# Patient Record
Sex: Male | Born: 1935
Health system: Southern US, Community
[De-identification: ages and names within clinical notes are randomized; demographics above are authoritative.]

## PROBLEM LIST (undated history)

## (undated) DIAGNOSIS — E1140 Type 2 diabetes mellitus with diabetic neuropathy, unspecified: Secondary | ICD-10-CM

## (undated) DIAGNOSIS — K8689 Other specified diseases of pancreas: Secondary | ICD-10-CM

## (undated) DIAGNOSIS — I10 Essential (primary) hypertension: Secondary | ICD-10-CM

## (undated) DIAGNOSIS — M810 Age-related osteoporosis without current pathological fracture: Secondary | ICD-10-CM

## (undated) DIAGNOSIS — J309 Allergic rhinitis, unspecified: Secondary | ICD-10-CM

## (undated) DIAGNOSIS — G5600 Carpal tunnel syndrome, unspecified upper limb: Secondary | ICD-10-CM

## (undated) DIAGNOSIS — E669 Obesity, unspecified: Secondary | ICD-10-CM

## (undated) DIAGNOSIS — N451 Epididymitis: Secondary | ICD-10-CM

## (undated) DIAGNOSIS — N529 Male erectile dysfunction, unspecified: Secondary | ICD-10-CM

## (undated) DIAGNOSIS — F4310 Post-traumatic stress disorder, unspecified: Secondary | ICD-10-CM

## (undated) DIAGNOSIS — C61 Malignant neoplasm of prostate: Secondary | ICD-10-CM

## (undated) DIAGNOSIS — R351 Nocturia: Secondary | ICD-10-CM

## (undated) DIAGNOSIS — M1990 Unspecified osteoarthritis, unspecified site: Secondary | ICD-10-CM

## (undated) DIAGNOSIS — E7800 Pure hypercholesterolemia, unspecified: Secondary | ICD-10-CM

## (undated) DIAGNOSIS — E119 Type 2 diabetes mellitus without complications: Secondary | ICD-10-CM

## (undated) HISTORY — DX: Male erectile dysfunction, unspecified: N52.9

## (undated) HISTORY — PX: KNEE ARTHROSCOPY: SUR90

## (undated) HISTORY — PX: CATARACT EXTRACTION: SUR2

## (undated) HISTORY — DX: Unspecified osteoarthritis, unspecified site: M19.90

## (undated) HISTORY — DX: Obesity, unspecified: E66.9

## (undated) HISTORY — DX: Type 2 diabetes mellitus with diabetic neuropathy, unspecified: E11.40

## (undated) HISTORY — DX: Nocturia: R35.1

## (undated) HISTORY — DX: Post-traumatic stress disorder, unspecified: F43.10

## (undated) HISTORY — DX: Essential (primary) hypertension: I10

## (undated) HISTORY — DX: Pure hypercholesterolemia, unspecified: E78.00

## (undated) HISTORY — DX: Carpal tunnel syndrome, unspecified upper limb: G56.00

## (undated) HISTORY — PX: MULTIPLE TOOTH EXTRACTIONS: SHX2053

## (undated) HISTORY — PX: PROSTATECTOMY: SHX69

## (undated) HISTORY — DX: Age-related osteoporosis without current pathological fracture: M81.0

## (undated) HISTORY — PX: ROTATOR CUFF REPAIR: SHX139

## (undated) HISTORY — DX: Epididymitis: N45.1

## (undated) HISTORY — DX: Malignant neoplasm of prostate: C61

## (undated) HISTORY — DX: Allergic rhinitis, unspecified: J30.9

---

## 2002-07-20 ENCOUNTER — Encounter: Admission: RE | Admit: 2002-07-20 | Discharge: 2002-07-20 | Payer: Self-pay | Admitting: Orthopedic Surgery

## 2002-07-20 ENCOUNTER — Ambulatory Visit (HOSPITAL_BASED_OUTPATIENT_CLINIC_OR_DEPARTMENT_OTHER): Admission: RE | Admit: 2002-07-20 | Discharge: 2002-07-21 | Payer: Self-pay | Admitting: Orthopedic Surgery

## 2002-07-20 ENCOUNTER — Encounter: Payer: Self-pay | Admitting: Orthopedic Surgery

## 2005-03-30 ENCOUNTER — Emergency Department: Payer: Self-pay | Admitting: Emergency Medicine

## 2006-02-16 ENCOUNTER — Ambulatory Visit: Payer: Self-pay | Admitting: Gastroenterology

## 2007-02-11 DIAGNOSIS — J309 Allergic rhinitis, unspecified: Secondary | ICD-10-CM

## 2007-02-11 DIAGNOSIS — I10 Essential (primary) hypertension: Secondary | ICD-10-CM | POA: Insufficient documentation

## 2007-02-11 DIAGNOSIS — M810 Age-related osteoporosis without current pathological fracture: Secondary | ICD-10-CM

## 2007-02-11 DIAGNOSIS — M1990 Unspecified osteoarthritis, unspecified site: Secondary | ICD-10-CM | POA: Insufficient documentation

## 2007-02-11 DIAGNOSIS — E7800 Pure hypercholesterolemia, unspecified: Secondary | ICD-10-CM | POA: Insufficient documentation

## 2007-02-11 DIAGNOSIS — Z720 Tobacco use: Secondary | ICD-10-CM | POA: Insufficient documentation

## 2007-02-11 HISTORY — DX: Allergic rhinitis, unspecified: J30.9

## 2007-02-11 HISTORY — DX: Essential (primary) hypertension: I10

## 2007-02-11 HISTORY — DX: Age-related osteoporosis without current pathological fracture: M81.0

## 2007-02-11 HISTORY — DX: Unspecified osteoarthritis, unspecified site: M19.90

## 2007-02-11 HISTORY — DX: Pure hypercholesterolemia, unspecified: E78.00

## 2007-07-15 ENCOUNTER — Ambulatory Visit: Payer: Self-pay | Admitting: Family Medicine

## 2007-07-20 ENCOUNTER — Ambulatory Visit: Payer: Self-pay | Admitting: Family Medicine

## 2008-10-15 ENCOUNTER — Encounter: Admission: RE | Admit: 2008-10-15 | Discharge: 2008-10-15 | Payer: Self-pay | Admitting: Internal Medicine

## 2008-10-18 ENCOUNTER — Ambulatory Visit (HOSPITAL_BASED_OUTPATIENT_CLINIC_OR_DEPARTMENT_OTHER): Admission: RE | Admit: 2008-10-18 | Discharge: 2008-10-19 | Payer: Self-pay | Admitting: Orthopedic Surgery

## 2009-02-12 DIAGNOSIS — E1140 Type 2 diabetes mellitus with diabetic neuropathy, unspecified: Secondary | ICD-10-CM

## 2009-02-12 HISTORY — DX: Type 2 diabetes mellitus with diabetic neuropathy, unspecified: E11.40

## 2009-12-17 ENCOUNTER — Encounter: Admission: RE | Admit: 2009-12-17 | Discharge: 2009-12-17 | Payer: Self-pay | Admitting: Orthopedic Surgery

## 2010-06-14 LAB — BASIC METABOLIC PANEL
BUN: 11 mg/dL (ref 6–23)
CO2: 27 mEq/L (ref 19–32)
Calcium: 9.3 mg/dL (ref 8.4–10.5)
Chloride: 105 mEq/L (ref 96–112)
Creatinine, Ser: 0.83 mg/dL (ref 0.4–1.5)
GFR calc Af Amer: >60 mL/min (ref >60)
GFR calc non Af Amer: >60 mL/min (ref >60)
Glucose, Bld: 121 mg/dL — ABNORMAL HIGH (ref 70–99)
Potassium: 3.9 mEq/L (ref 3.5–5.1)
Sodium: 139 mEq/L (ref 135–145)

## 2010-06-14 LAB — POCT HEMOGLOBIN-HEMACUE: Hemoglobin: 15 g/dL (ref 13.0–17.0)

## 2010-06-25 ENCOUNTER — Ambulatory Visit: Payer: Self-pay | Admitting: Family Medicine

## 2010-07-08 ENCOUNTER — Ambulatory Visit: Payer: Self-pay | Admitting: Family Medicine

## 2010-07-22 NOTE — Op Note (Signed)
NAME:  Michael Murphy, Michael Murphy NO.:  [id number]   MEDICAL RECORD NO.:  [id number]          PATIENT TYPE:  AMB   LOCATION:  DSC                          FACILITY:  [facility]   PHYSICIAN:  Loreta Ave, M.D. DATE OF BIRTH:  [birth year]   DATE OF PROCEDURE:  10/18/2008  DATE OF DISCHARGE:                               OPERATIVE REPORT   PREOPERATIVE DIAGNOSES:  Left shoulder degenerative arthritis,  impingement, labral tears, rotator cuff tear, and distal clavicle  osteolysis.   POSTOPERATIVE DIAGNOSES:  Left shoulder degenerative arthritis,  impingement, labral tears, rotator cuff tear, and distal clavicle  osteolysis with some grade 3 and 4 changes of humeral head, and numerous  chondral loose bodies.  Full thickness tear with tendinosis throughout  the crescent region of supraspinatus tendon.  Grade 4 changes  acromioclavicular joint with loose body there.   PROCEDURE:  Left shoulder exam under anesthesia, arthroscopy, removal of  loose bodies, and chondroplasty of glenohumeral joint.  Debridement of  labrum and rotator cuff.  Bursectomy, acromioplasty, and coracoacromial  ligament release.  Excision of distal clavicle, removal of loose body.  Arthroscopic-assisted rotator cuff repair with suture x2, Swivel Lock  anchors x2.   SURGEON:  Loreta Ave, MD   ASSISTANT:  Genene Churn. Barry Dienes, [place], present throughout the entire case and  necessary for timely completion of procedure.   ANESTHESIA:  General.   BLOOD LOSS:  Minimal.   SPECIMENS:  None.   CULTURES:  None.   COMPLICATIONS:  None.   DRESSING:  Soft compressive with shoulder immobilizer.   PROCEDURE:  The patient was brought to the operating room and placed on  operating table in supine position.  After adequate anesthesia had been  obtained, shoulder examined.  Full motion, stable shoulder.  Placed in  beach-chair position on the shoulder positioner, prepped and draped in  usual sterile fashion.  Three  portals; anterior, posterior, and lateral.  Shoulder entered with blunt obturator.  Arthroscope introduced and  shoulder distended and inspected.  Numerous chondral loose bodies  debrided.  Chondroplasty for unstable surface.  Most of glenoid did not  look bad but about half the articular cartilage of humerus was grade 3  and 4.  Complex tearing of labrum debrided.  Full-thickness tear with  marked intertendinous tearing throughout the entire crescent region of  supraspinatus tendon.  Debrided and mobilized back to healthy tissue.  Biceps tendon, biceps anchor intact.  Tuberosity roughened to bleeding  bone.  Subacromial space accessed.  Type 2 to 3 acromion.  Reactive  bursitis.  Bursa resected, cuff debrided.  Acromioplasty to a type 1  acromion with shaver and high-speed bur releasing CA ligament with  cautery.  Distal clavicle grade 4 changes.  Loose body superior aspect  also removed.  Adequacy of decompression, debridement confirmed.  Cannula placed lateral and anterior.  With the Scorpion device, the cuff  was captured with 2 horizontal mattress sutures.  Tuberosity roughened  and these were firmly anchored down in the tuberosity at 2 different  sites with Swivel Lock anchors which were pre-tapped.  At completion,  nice firm watertight closure cuff confirmed.  Good decompression  confirmed as well.  Instruments and fluid removed.  Portals, shoulder,  and bursa injected with Marcaine.  Portals closed with 4-0 nylon.  Sterile compressive dressing applied.  Shoulder immobilizer applied.  Anesthesia reversed.  Brought to the recovery room.  Tolerated surgery  well.  No complications.      Loreta Ave, M.D.  Electronically Signed     Loreta Ave, M.D.  Electronically Signed    DFM/MEDQ  D:  10/18/2008  T:  10/19/2008  Job:  [id number]

## 2010-07-23 ENCOUNTER — Ambulatory Visit: Payer: Self-pay | Admitting: Gastroenterology

## 2010-07-23 LAB — HM COLONOSCOPY

## 2010-07-25 NOTE — Op Note (Signed)
NAME:  Michael Murphy, Michael Murphy                             ACCOUNT NO.:  [id number]   MEDICAL RECORD NO.:  [id number]                   PATIENT TYPE:  AMB   LOCATION:  DSC                                  FACILITY:  [facility]   PHYSICIAN:  Loreta Ave, M.D.              DATE OF BIRTH:  [birth year]   DATE OF PROCEDURE:  07/20/2002  DATE OF DISCHARGE:                                 OPERATIVE REPORT   PREOPERATIVE DIAGNOSIS:  Right shoulder massive chronic rotator cuff tear  with chronic impingement and degenerative joint disease acromioclavicular  joint.   POSTOPERATIVE DIAGNOSES:  1. Right shoulder massive chronic rotator cuff tear with chronic impingement     and degenerative joint disease acromioclavicular joint with grade 3     degenerative joint disease, glenohumeral joint.  2. Marked attritional tearing, labrum, and extensive partial tearing, long     head biceps tendon.   PROCEDURES:  1. Right shoulder exam under anesthesia.  2. Arthroscopy.  3. Debridement of labrum, biceps tendon, and glenohumeral joint.  4. Acromioplasty.  5. Excision distal clavicle.  6. Release CA ligament.  7. Rotator cuff repair with Fibrewire suture and Concept repair system.   SURGEON:  Loreta Ave, M.D.   ASSISTANT:  Arlys John D. Petrarca, P.A.-C.   ANESTHESIA:  General.   BLOOD LOSS:  Minimal.   SPECIMENS:  None.   CULTURES:  None.   COMPLICATIONS:  None.   DRESSINGS:  Soft compressive with shoulder immobilizer.   DESCRIPTION OF PROCEDURE:  The patient brought to the operating room, placed  on the operating table in supine position.  After adequate anesthesia had  been obtained, the right shoulder examined.  Full passive motion, good  stability.  Placed in the beach chair position, on a shoulder positioner,  prepped and draped in the usual sterile fashion.  Three standard  arthroscopic portals, anterior, posterior, and lateral.  Shoulder elevated  with blunt obturator, distended, and  inspected.  Massive complete evulsion  supraspinatus and top half of the infraspinatus tendon.  Retracted about  halfway to the glenoid.  A fair amount of intertendinous tearing.  Debrided  to healthy tissue.  Despite the size of the tear and the chronicity, it was  easily brought over the attachment site and very repairable.  Prior to  repair, the glenohumeral joint was further assessed.  Extensive attritional  tearing, labrum, circumferentially debrided.  Uneven articular cartilage  with grade 2 and 3 changes, debrided.  Long head biceps tendon had marked  extensive tearing throughout its length, and all of this was debrided back  to healthy tissue, leaving more than half the tendon still intact, and it  was still anchored within the bicipital groove.  Capsule and ligamentum  structures intact.  Cannula then redirected subacromially.  Type 3 acromion  and chronic impingement.  Cuff assessed, again found to be repairable.  Bursa resected bringing  acromioplasty to a type 1 acromion, releasing the CA  ligament with shaver and a high-speed bur.  Distal clavicle grade 4 changes.  Lateral centimeter resected.  Adequate CA decompression.  Clavicle incision  confirmed from all portals.  Instruments and fluid removed.  Lateral portal  opened into a deltoid-splitting incision, exposing subacromial space.  Cuff  was mobilized and well-captured with three Fibrewire sutures.  Bony trough  created in the humerus adjacent to the articular cartilage.  A series of  drill holes are made with the Concept repair system and a Fibrewire suture  then weaved into the cuff, capturing it very well medially, was then weaved  through drill holes.  These were then firmly tied over a bony bridge.  This  yielded a nice, firm, watertight closure of the cuff without undue tension.  I could bring the arm to the side without undue tension.  Adequate CA  decompression confirmed visually at time of cuff repair.  Wound  thoroughly  irrigated.  Deltoid closed with Vicryl.  Skin and subcutaneous tissue with  Vicryl.  Ports were closed with nylon.  The margin of the wound, shoulder,  and bursa injected with Marcaine.  Sterile compressive dressing and shoulder  immobilizer applied.  Anesthesia reversed.  Brought to recovery room.  Tolerated surgery well.  No complications.                                               Loreta Ave, M.D.    DFM/MEDQ  D:  07/20/2002  T:  07/21/2002  Job:  [id number]

## 2010-08-08 ENCOUNTER — Ambulatory Visit: Payer: Self-pay | Admitting: Family Medicine

## 2011-04-01 DIAGNOSIS — B351 Tinea unguium: Secondary | ICD-10-CM | POA: Diagnosis not present

## 2011-04-01 DIAGNOSIS — E119 Type 2 diabetes mellitus without complications: Secondary | ICD-10-CM | POA: Diagnosis not present

## 2011-04-15 DIAGNOSIS — E119 Type 2 diabetes mellitus without complications: Secondary | ICD-10-CM | POA: Diagnosis not present

## 2011-04-15 DIAGNOSIS — E7800 Pure hypercholesterolemia, unspecified: Secondary | ICD-10-CM | POA: Diagnosis not present

## 2011-04-15 DIAGNOSIS — J309 Allergic rhinitis, unspecified: Secondary | ICD-10-CM | POA: Diagnosis not present

## 2011-04-15 DIAGNOSIS — I10 Essential (primary) hypertension: Secondary | ICD-10-CM | POA: Diagnosis not present

## 2011-04-28 DIAGNOSIS — J309 Allergic rhinitis, unspecified: Secondary | ICD-10-CM | POA: Diagnosis not present

## 2011-04-28 DIAGNOSIS — J069 Acute upper respiratory infection, unspecified: Secondary | ICD-10-CM | POA: Diagnosis not present

## 2011-04-28 DIAGNOSIS — I10 Essential (primary) hypertension: Secondary | ICD-10-CM | POA: Diagnosis not present

## 2011-04-28 DIAGNOSIS — E7800 Pure hypercholesterolemia, unspecified: Secondary | ICD-10-CM | POA: Diagnosis not present

## 2011-05-22 ENCOUNTER — Ambulatory Visit: Payer: Self-pay | Admitting: Family Medicine

## 2011-05-22 DIAGNOSIS — R1012 Left upper quadrant pain: Secondary | ICD-10-CM | POA: Diagnosis not present

## 2011-05-22 DIAGNOSIS — E7800 Pure hypercholesterolemia, unspecified: Secondary | ICD-10-CM | POA: Diagnosis not present

## 2011-05-22 DIAGNOSIS — I10 Essential (primary) hypertension: Secondary | ICD-10-CM | POA: Diagnosis not present

## 2011-05-22 DIAGNOSIS — R109 Unspecified abdominal pain: Secondary | ICD-10-CM | POA: Diagnosis not present

## 2011-05-22 DIAGNOSIS — Z9889 Other specified postprocedural states: Secondary | ICD-10-CM | POA: Diagnosis not present

## 2011-05-22 DIAGNOSIS — J309 Allergic rhinitis, unspecified: Secondary | ICD-10-CM | POA: Diagnosis not present

## 2011-05-25 DIAGNOSIS — I10 Essential (primary) hypertension: Secondary | ICD-10-CM | POA: Diagnosis not present

## 2011-05-25 DIAGNOSIS — J309 Allergic rhinitis, unspecified: Secondary | ICD-10-CM | POA: Diagnosis not present

## 2011-05-25 DIAGNOSIS — K589 Irritable bowel syndrome without diarrhea: Secondary | ICD-10-CM | POA: Diagnosis not present

## 2011-05-25 DIAGNOSIS — R1012 Left upper quadrant pain: Secondary | ICD-10-CM | POA: Diagnosis not present

## 2011-06-29 DIAGNOSIS — B351 Tinea unguium: Secondary | ICD-10-CM | POA: Diagnosis not present

## 2011-06-29 DIAGNOSIS — M79609 Pain in unspecified limb: Secondary | ICD-10-CM | POA: Diagnosis not present

## 2011-09-22 DIAGNOSIS — E119 Type 2 diabetes mellitus without complications: Secondary | ICD-10-CM | POA: Diagnosis not present

## 2011-09-22 DIAGNOSIS — B351 Tinea unguium: Secondary | ICD-10-CM | POA: Diagnosis not present

## 2011-09-28 DIAGNOSIS — Z0000 Encounter for general adult medical examination without abnormal findings: Secondary | ICD-10-CM | POA: Diagnosis not present

## 2011-09-28 DIAGNOSIS — C61 Malignant neoplasm of prostate: Secondary | ICD-10-CM | POA: Diagnosis not present

## 2011-09-28 DIAGNOSIS — I10 Essential (primary) hypertension: Secondary | ICD-10-CM | POA: Diagnosis not present

## 2011-09-28 DIAGNOSIS — E7800 Pure hypercholesterolemia, unspecified: Secondary | ICD-10-CM | POA: Diagnosis not present

## 2011-09-28 DIAGNOSIS — E119 Type 2 diabetes mellitus without complications: Secondary | ICD-10-CM | POA: Diagnosis not present

## 2011-12-07 DIAGNOSIS — C61 Malignant neoplasm of prostate: Secondary | ICD-10-CM | POA: Insufficient documentation

## 2011-12-07 DIAGNOSIS — Z8546 Personal history of malignant neoplasm of prostate: Secondary | ICD-10-CM | POA: Insufficient documentation

## 2011-12-07 DIAGNOSIS — N529 Male erectile dysfunction, unspecified: Secondary | ICD-10-CM | POA: Diagnosis not present

## 2011-12-15 DIAGNOSIS — E119 Type 2 diabetes mellitus without complications: Secondary | ICD-10-CM | POA: Diagnosis not present

## 2011-12-15 DIAGNOSIS — B351 Tinea unguium: Secondary | ICD-10-CM | POA: Diagnosis not present

## 2012-01-13 DIAGNOSIS — E7800 Pure hypercholesterolemia, unspecified: Secondary | ICD-10-CM | POA: Diagnosis not present

## 2012-01-13 DIAGNOSIS — E119 Type 2 diabetes mellitus without complications: Secondary | ICD-10-CM | POA: Diagnosis not present

## 2012-01-13 DIAGNOSIS — Z79899 Other long term (current) drug therapy: Secondary | ICD-10-CM | POA: Diagnosis not present

## 2012-01-13 DIAGNOSIS — I10 Essential (primary) hypertension: Secondary | ICD-10-CM | POA: Diagnosis not present

## 2012-01-13 DIAGNOSIS — R748 Abnormal levels of other serum enzymes: Secondary | ICD-10-CM | POA: Diagnosis not present

## 2012-01-13 DIAGNOSIS — G5600 Carpal tunnel syndrome, unspecified upper limb: Secondary | ICD-10-CM | POA: Diagnosis not present

## 2012-01-21 DIAGNOSIS — C61 Malignant neoplasm of prostate: Secondary | ICD-10-CM | POA: Diagnosis not present

## 2012-03-14 DIAGNOSIS — B351 Tinea unguium: Secondary | ICD-10-CM | POA: Diagnosis not present

## 2012-03-14 DIAGNOSIS — E119 Type 2 diabetes mellitus without complications: Secondary | ICD-10-CM | POA: Diagnosis not present

## 2012-05-11 DIAGNOSIS — C61 Malignant neoplasm of prostate: Secondary | ICD-10-CM | POA: Diagnosis not present

## 2012-05-11 DIAGNOSIS — E119 Type 2 diabetes mellitus without complications: Secondary | ICD-10-CM | POA: Diagnosis not present

## 2012-05-11 DIAGNOSIS — E785 Hyperlipidemia, unspecified: Secondary | ICD-10-CM | POA: Diagnosis not present

## 2012-05-11 DIAGNOSIS — I10 Essential (primary) hypertension: Secondary | ICD-10-CM | POA: Diagnosis not present

## 2012-06-02 DIAGNOSIS — C61 Malignant neoplasm of prostate: Secondary | ICD-10-CM | POA: Diagnosis not present

## 2012-06-06 DIAGNOSIS — E119 Type 2 diabetes mellitus without complications: Secondary | ICD-10-CM | POA: Diagnosis not present

## 2012-06-06 DIAGNOSIS — B351 Tinea unguium: Secondary | ICD-10-CM | POA: Diagnosis not present

## 2012-08-03 DIAGNOSIS — E785 Hyperlipidemia, unspecified: Secondary | ICD-10-CM | POA: Diagnosis not present

## 2012-08-03 DIAGNOSIS — W57XXXA Bitten or stung by nonvenomous insect and other nonvenomous arthropods, initial encounter: Secondary | ICD-10-CM | POA: Diagnosis not present

## 2012-08-03 DIAGNOSIS — J069 Acute upper respiratory infection, unspecified: Secondary | ICD-10-CM | POA: Diagnosis not present

## 2012-08-03 DIAGNOSIS — R05 Cough: Secondary | ICD-10-CM | POA: Diagnosis not present

## 2012-08-03 DIAGNOSIS — R059 Cough, unspecified: Secondary | ICD-10-CM | POA: Diagnosis not present

## 2012-08-03 DIAGNOSIS — T148 Other injury of unspecified body region: Secondary | ICD-10-CM | POA: Diagnosis not present

## 2012-08-29 DIAGNOSIS — B351 Tinea unguium: Secondary | ICD-10-CM | POA: Diagnosis not present

## 2012-08-29 DIAGNOSIS — E119 Type 2 diabetes mellitus without complications: Secondary | ICD-10-CM | POA: Diagnosis not present

## 2012-08-31 DIAGNOSIS — M129 Arthropathy, unspecified: Secondary | ICD-10-CM | POA: Diagnosis not present

## 2012-08-31 DIAGNOSIS — E7800 Pure hypercholesterolemia, unspecified: Secondary | ICD-10-CM | POA: Diagnosis not present

## 2012-08-31 DIAGNOSIS — E119 Type 2 diabetes mellitus without complications: Secondary | ICD-10-CM | POA: Diagnosis not present

## 2012-08-31 DIAGNOSIS — I10 Essential (primary) hypertension: Secondary | ICD-10-CM | POA: Diagnosis not present

## 2012-11-28 DIAGNOSIS — E119 Type 2 diabetes mellitus without complications: Secondary | ICD-10-CM | POA: Diagnosis not present

## 2012-11-28 DIAGNOSIS — B351 Tinea unguium: Secondary | ICD-10-CM | POA: Diagnosis not present

## 2012-12-22 DIAGNOSIS — Z1331 Encounter for screening for depression: Secondary | ICD-10-CM | POA: Diagnosis not present

## 2012-12-22 DIAGNOSIS — E7800 Pure hypercholesterolemia, unspecified: Secondary | ICD-10-CM | POA: Diagnosis not present

## 2012-12-22 DIAGNOSIS — I10 Essential (primary) hypertension: Secondary | ICD-10-CM | POA: Diagnosis not present

## 2012-12-22 DIAGNOSIS — E119 Type 2 diabetes mellitus without complications: Secondary | ICD-10-CM | POA: Diagnosis not present

## 2013-01-23 DIAGNOSIS — R351 Nocturia: Secondary | ICD-10-CM

## 2013-01-23 DIAGNOSIS — N529 Male erectile dysfunction, unspecified: Secondary | ICD-10-CM

## 2013-01-23 DIAGNOSIS — C61 Malignant neoplasm of prostate: Secondary | ICD-10-CM | POA: Diagnosis not present

## 2013-01-23 HISTORY — DX: Nocturia: R35.1

## 2013-01-23 HISTORY — DX: Male erectile dysfunction, unspecified: N52.9

## 2013-01-23 LAB — PSA: PSA: 0.7

## 2013-02-15 DIAGNOSIS — E119 Type 2 diabetes mellitus without complications: Secondary | ICD-10-CM | POA: Diagnosis not present

## 2013-02-15 DIAGNOSIS — E7800 Pure hypercholesterolemia, unspecified: Secondary | ICD-10-CM | POA: Diagnosis not present

## 2013-02-15 DIAGNOSIS — Z0000 Encounter for general adult medical examination without abnormal findings: Secondary | ICD-10-CM | POA: Diagnosis not present

## 2013-02-15 DIAGNOSIS — Z1339 Encounter for screening examination for other mental health and behavioral disorders: Secondary | ICD-10-CM | POA: Diagnosis not present

## 2013-02-15 DIAGNOSIS — Z1331 Encounter for screening for depression: Secondary | ICD-10-CM | POA: Diagnosis not present

## 2013-02-27 DIAGNOSIS — B351 Tinea unguium: Secondary | ICD-10-CM | POA: Diagnosis not present

## 2013-02-27 DIAGNOSIS — E119 Type 2 diabetes mellitus without complications: Secondary | ICD-10-CM | POA: Diagnosis not present

## 2013-05-23 DIAGNOSIS — E785 Hyperlipidemia, unspecified: Secondary | ICD-10-CM | POA: Diagnosis not present

## 2013-05-23 DIAGNOSIS — E119 Type 2 diabetes mellitus without complications: Secondary | ICD-10-CM | POA: Diagnosis not present

## 2013-05-23 DIAGNOSIS — I2510 Atherosclerotic heart disease of native coronary artery without angina pectoris: Secondary | ICD-10-CM | POA: Diagnosis not present

## 2013-05-23 DIAGNOSIS — I10 Essential (primary) hypertension: Secondary | ICD-10-CM | POA: Diagnosis not present

## 2013-05-29 DIAGNOSIS — L03039 Cellulitis of unspecified toe: Secondary | ICD-10-CM | POA: Diagnosis not present

## 2013-05-29 DIAGNOSIS — L600 Ingrowing nail: Secondary | ICD-10-CM | POA: Diagnosis not present

## 2013-09-04 DIAGNOSIS — B351 Tinea unguium: Secondary | ICD-10-CM | POA: Diagnosis not present

## 2013-09-04 DIAGNOSIS — E119 Type 2 diabetes mellitus without complications: Secondary | ICD-10-CM | POA: Diagnosis not present

## 2013-09-18 DIAGNOSIS — E7800 Pure hypercholesterolemia, unspecified: Secondary | ICD-10-CM | POA: Diagnosis not present

## 2013-09-18 DIAGNOSIS — Z23 Encounter for immunization: Secondary | ICD-10-CM | POA: Diagnosis not present

## 2013-09-18 DIAGNOSIS — E1149 Type 2 diabetes mellitus with other diabetic neurological complication: Secondary | ICD-10-CM | POA: Diagnosis not present

## 2013-09-18 DIAGNOSIS — I10 Essential (primary) hypertension: Secondary | ICD-10-CM | POA: Diagnosis not present

## 2013-09-18 DIAGNOSIS — Z79899 Other long term (current) drug therapy: Secondary | ICD-10-CM | POA: Diagnosis not present

## 2013-09-18 LAB — CBC AND DIFFERENTIAL
HCT: 43 % (ref 41–53)
Hemoglobin: 14.3 g/dL (ref 13.5–17.5)
Neutrophils Absolute: 55 /uL
Platelets: 165 10*3/uL (ref 150–399)
WBC: 6.7 10^3/mL

## 2013-09-18 LAB — LIPID PANEL
Cholesterol: 187 mg/dL (ref 0–200)
HDL: 68 mg/dL (ref 35–70)
LDL Cholesterol: 105 mg/dL
LDl/HDL Ratio: 1.5
Triglycerides: 69 mg/dL (ref 40–160)

## 2013-09-18 LAB — BASIC METABOLIC PANEL
BUN: 12 mg/dL (ref 4–21)
Creatinine: 1 mg/dL (ref 0.6–1.3)
Glucose: 105 mg/dL
Potassium: 4.5 mmol/L (ref 3.4–5.3)
Sodium: 143 mmol/L (ref 137–147)

## 2013-09-18 LAB — HEPATIC FUNCTION PANEL
ALT: 20 U/L (ref 10–40)
AST: 21 U/L (ref 14–40)
Alkaline Phosphatase: 46 U/L (ref 25–125)
Bilirubin, Total: 0.6 mg/dL

## 2013-09-18 LAB — HEMOGLOBIN A1C: Hgb A1c MFr Bld: 6.5 % — AB (ref 4.0–6.0)

## 2013-12-05 DIAGNOSIS — E119 Type 2 diabetes mellitus without complications: Secondary | ICD-10-CM | POA: Diagnosis not present

## 2013-12-05 DIAGNOSIS — B351 Tinea unguium: Secondary | ICD-10-CM | POA: Diagnosis not present

## 2014-01-25 DIAGNOSIS — N529 Male erectile dysfunction, unspecified: Secondary | ICD-10-CM | POA: Diagnosis not present

## 2014-01-25 DIAGNOSIS — C61 Malignant neoplasm of prostate: Secondary | ICD-10-CM | POA: Diagnosis not present

## 2014-01-25 DIAGNOSIS — M1990 Unspecified osteoarthritis, unspecified site: Secondary | ICD-10-CM | POA: Diagnosis not present

## 2014-01-25 DIAGNOSIS — Z7982 Long term (current) use of aspirin: Secondary | ICD-10-CM | POA: Diagnosis not present

## 2014-01-25 DIAGNOSIS — Z87891 Personal history of nicotine dependence: Secondary | ICD-10-CM | POA: Diagnosis not present

## 2014-01-25 DIAGNOSIS — E119 Type 2 diabetes mellitus without complications: Secondary | ICD-10-CM | POA: Diagnosis not present

## 2014-02-19 DIAGNOSIS — E119 Type 2 diabetes mellitus without complications: Secondary | ICD-10-CM | POA: Diagnosis not present

## 2014-02-19 DIAGNOSIS — I10 Essential (primary) hypertension: Secondary | ICD-10-CM | POA: Diagnosis not present

## 2014-03-14 DIAGNOSIS — E119 Type 2 diabetes mellitus without complications: Secondary | ICD-10-CM | POA: Diagnosis not present

## 2014-03-14 DIAGNOSIS — B351 Tinea unguium: Secondary | ICD-10-CM | POA: Diagnosis not present

## 2014-06-01 DIAGNOSIS — C61 Malignant neoplasm of prostate: Secondary | ICD-10-CM | POA: Diagnosis not present

## 2014-06-14 DIAGNOSIS — E119 Type 2 diabetes mellitus without complications: Secondary | ICD-10-CM | POA: Diagnosis not present

## 2014-06-14 DIAGNOSIS — B351 Tinea unguium: Secondary | ICD-10-CM | POA: Diagnosis not present

## 2014-07-12 DIAGNOSIS — N451 Epididymitis: Secondary | ICD-10-CM

## 2014-07-12 DIAGNOSIS — E669 Obesity, unspecified: Secondary | ICD-10-CM

## 2014-07-12 DIAGNOSIS — G5600 Carpal tunnel syndrome, unspecified upper limb: Secondary | ICD-10-CM | POA: Insufficient documentation

## 2014-07-12 DIAGNOSIS — Z9889 Other specified postprocedural states: Secondary | ICD-10-CM | POA: Insufficient documentation

## 2014-07-12 DIAGNOSIS — C61 Malignant neoplasm of prostate: Secondary | ICD-10-CM | POA: Insufficient documentation

## 2014-07-12 DIAGNOSIS — F4310 Post-traumatic stress disorder, unspecified: Secondary | ICD-10-CM

## 2014-07-12 DIAGNOSIS — R899 Unspecified abnormal finding in specimens from other organs, systems and tissues: Secondary | ICD-10-CM | POA: Insufficient documentation

## 2014-07-12 HISTORY — DX: Epididymitis: N45.1

## 2014-07-12 HISTORY — DX: Malignant neoplasm of prostate: C61

## 2014-07-12 HISTORY — DX: Obesity, unspecified: E66.9

## 2014-07-12 HISTORY — DX: Carpal tunnel syndrome, unspecified upper limb: G56.00

## 2014-07-12 HISTORY — DX: Post-traumatic stress disorder, unspecified: F43.10

## 2014-07-27 DIAGNOSIS — M1712 Unilateral primary osteoarthritis, left knee: Secondary | ICD-10-CM | POA: Diagnosis not present

## 2014-08-22 ENCOUNTER — Ambulatory Visit
Admission: RE | Admit: 2014-08-22 | Discharge: 2014-08-22 | Disposition: A | Discharge status: Home / Self Care | Payer: Medicare Other | Source: Ambulatory Visit | Attending: Family Medicine | Admitting: Family Medicine

## 2014-08-22 ENCOUNTER — Encounter: Payer: Self-pay | Admitting: Family Medicine

## 2014-08-22 ENCOUNTER — Telehealth: Payer: Self-pay

## 2014-08-22 ENCOUNTER — Ambulatory Visit (INDEPENDENT_AMBULATORY_CARE_PROVIDER_SITE_OTHER): Payer: Medicare Other | Admitting: Family Medicine

## 2014-08-22 VITALS — BP 146/62 | HR 88 | Temp 97.8°F | Resp 16 | Ht 67.5 in | Wt 193.0 lb

## 2014-08-22 DIAGNOSIS — R05 Cough: Secondary | ICD-10-CM | POA: Diagnosis not present

## 2014-08-22 DIAGNOSIS — Z23 Encounter for immunization: Secondary | ICD-10-CM | POA: Diagnosis not present

## 2014-08-22 DIAGNOSIS — R059 Cough, unspecified: Secondary | ICD-10-CM

## 2014-08-22 DIAGNOSIS — Z0000 Encounter for general adult medical examination without abnormal findings: Secondary | ICD-10-CM

## 2014-08-22 NOTE — Telephone Encounter (Signed)
-----   Message from Jerrol Banana., MD sent at 08/22/2014  2:28 PM EDT ----- Mild walking pneumonia possibly present. Take antibiotics as directed. Return if he gets worse.

## 2014-08-22 NOTE — Telephone Encounter (Signed)
Patient has been advised  ED 

## 2014-08-22 NOTE — Progress Notes (Signed)
Patient ID: Michael Murphy, male   DOB: 07/29/1927, 81 y.o.   MRN: [id number] Patient: Michael Murphy, Male    DOB: 12/03/1927, 81 y.o.   MRN: [id number] Visit Date: 08/22/2014  Today's Provider: [name], MD   Chief Complaint  Patient presents with  . Medicare Wellness   Subjective:   Michael Murphy is a 81 y.o. male who presents today for his Subsequent Annual Wellness Visit. He feels well. He reports exercising none. He reports he is sleeping well. Patient has had a hacking cough for the past month. It is productive of clear mucus but it is getting better. Review of Systems  Constitutional: Negative.   HENT: Negative.   Eyes: Negative.   Respiratory: Positive for cough.   Cardiovascular: Negative.   Gastrointestinal: Positive for constipation.  Endocrine: Negative.   Genitourinary: Negative.   Musculoskeletal: Positive for arthralgias.  Skin: Negative.   Allergic/Immunologic: Negative.   Neurological: Negative.   Hematological: Negative.   Psychiatric/Behavioral: Negative.     Patient Active Problem List   Diagnosis Date Noted  . Abnormal laboratory test 07/12/2014  . Carpal tunnel syndrome 07/12/2014  . Epididymitis 07/12/2014  . Post-operative state 07/12/2014  . Adiposity 07/12/2014  . Neurosis, posttraumatic 07/12/2014  . CA of prostate 07/12/2014  . Diabetic neuropathy 02/12/2009  . Allergic rhinitis 02/11/2007  . Benign essential HTN 02/11/2007  . Arthritis, degenerative 02/11/2007  . OP (osteoporosis) 02/11/2007  . Hypercholesterolemia without hypertriglyceridemia 02/11/2007  . Current tobacco use 02/11/2007    History   Social History  . Marital Status: Widowed    Spouse Name: N/A  . Number of Children: N/A  . Years of Education: N/A   Occupational History  . Not on file.   Social History Main Topics  . Smoking status: Former Smoker -- 0.50 packs/day for 18 years    Types: Cigarettes    Quit date: 03/09/1991  . Smokeless tobacco: Not on file   . Alcohol Use: 1.2 oz/week    2 Standard drinks or equivalent per week     Comment: beers  . Drug Use: No  . Sexual Activity: Not on file   Other Topics Concern  . Not on file   Social History Narrative    Past Surgical History  Procedure Laterality Date  . Prostatectomy    . Cataract extraction    . Rotator cuff repair    . Knee arthroscopy      left    His family history includes Diabetes in his brother, brother, and sister; Hypertension in his mother and sister; Stroke in his mother.    Outpatient Prescriptions Prior to Visit  Medication Sig Dispense Refill  . alendronate (FOSAMAX) 70 MG tablet Take 1 tablet by mouth once a week.    Marland Kitchen amLODipine (NORVASC) 10 MG tablet Take 1 tablet by mouth daily.    Marland Kitchen docusate sodium (COLACE) 100 MG capsule Take 2 capsules by mouth daily.    Marland Kitchen FLAXSEED, LINSEED, PO Take 2 capsules by mouth daily.    . Garlic Oil 0630 MG CAPS Take 1 capsule by mouth daily.    . metFORMIN (GLUCOPHAGE) 500 MG tablet Take 1 tablet by mouth daily.    . Omega-3 Fatty Acids (FISH OIL) 1000 MG CAPS Take 1 capsule by mouth daily.    . simvastatin (ZOCOR) 20 MG tablet Take 1 tablet by mouth at bedtime.    . indomethacin (INDOCIN) 50 MG capsule Take 2 capsules by mouth daily as needed.  No facility-administered medications prior to visit.    Allergies  Allergen Reactions  . Accupril  [Quinapril Hcl] Swelling    throat swells    Patient Care Team: Jerrol Banana., MD as PCP - General (Family Medicine)  Objective:   Vitals:  Filed Vitals:   08/22/14 1029  BP: 146/62  Pulse: 88  Temp: 97.8 F (36.6 C)  TempSrc: Oral  Resp: 16  Height: 5' 7.5" (1.715 m)  Weight: 193 lb (87.544 kg)    Physical Exam  Constitutional: He is oriented to person, place, and time. He appears well-developed and well-nourished.  HENT:  Head: Normocephalic and atraumatic.  Right Ear: External ear normal.  Left Ear: External ear normal.  Eyes: Conjunctivae and  EOM are normal. Pupils are equal, round, and reactive to light.  Neck: Normal range of motion. Neck supple.  Cardiovascular: Normal rate, regular rhythm, normal heart sounds and intact distal pulses.   Pulmonary/Chest: Effort normal and breath sounds normal.  Abdominal: Soft. Bowel sounds are normal.  Genitourinary: Rectum normal and prostate normal.  Musculoskeletal: Normal range of motion.  Neurological: He is alert and oriented to person, place, and time.  Skin: Skin is warm and dry.  Psychiatric: He has a normal mood and affect. His behavior is normal. Judgment and thought content normal.    Activities of Daily Living In your present state of health, do you have any difficulty performing the following activities: 08/22/2014  Hearing? Y  Vision? N  Difficulty concentrating or making decisions? N  Walking or climbing stairs? N  Dressing or bathing? N  Doing errands, shopping? N    Fall Risk Assessment Fall Risk  08/22/2014  Falls in the past year? No     Depression Screen PHQ 2/9 Scores 08/22/2014  PHQ - 2 Score 0    Cognitive Testing - 6-CIT    Year: 0 4 points  Month: 0 3 points  Memorize "Pia Mau, [address] [place]"  Time (within 1 hour:) 0 3 points  Count backwards from 20: 0 2 4 points  Name months of year: 0 2 4 points  Repeat Address: 0 2 4 6 8 10  points   Total Score: 4/28  Interpretation : Normal (0-7) Abnormal (8-28)    Assessment & Plan:     Annual Wellness Visit  Reviewed patient's Family Medical History Reviewed and updated list of patient's medical providers Assessment of cognitive impairment was done Assessed patient's functional ability Established a written schedule for health screening [place] Completed and Reviewed  Exercise Activities and Dietary recommendations Goals    None      Immunization History  Administered Date(s) Administered  . Pneumococcal Conjugate-13 09/18/2013  . Tdap 12/26/2010     Health Maintenance  Topic Date Due  . FOOT EXAM  02/18/1940  . OPHTHALMOLOGY EXAM  02/18/1940  . URINE MICROALBUMIN  02/18/1940  . ZOSTAVAX  02/17/1990  . HEMOGLOBIN A1C  03/21/2014  . PNA vac Low Risk Adult (2 of 2 - PPSV23) 09/19/2014  . INFLUENZA VACCINE  10/08/2014  . COLONOSCOPY  07/22/2020  . TETANUS/TDAP  12/25/2020      Discussed health benefits of physical activity, and encouraged him to engage in regular exercise appropriate for his age and condition.   Chronic bronchitis Due to chronicity of cough and age of patient Will cover with doxycycline for one week. May need further workup if he worsens or does not improve over the next month.  Miguel Aschoff MD  [facility] 08/22/2014 10:42 AM  ------------------------------------------------------------------------------------------------------------

## 2014-08-23 ENCOUNTER — Other Ambulatory Visit: Payer: Self-pay

## 2014-08-23 ENCOUNTER — Other Ambulatory Visit: Payer: Self-pay | Admitting: Family Medicine

## 2014-09-05 ENCOUNTER — Ambulatory Visit (INDEPENDENT_AMBULATORY_CARE_PROVIDER_SITE_OTHER): Payer: Medicare Other | Admitting: Family Medicine

## 2014-09-05 ENCOUNTER — Encounter: Payer: Self-pay | Admitting: Family Medicine

## 2014-09-05 VITALS — BP 120/64 | HR 96 | Temp 98.1°F | Resp 16 | Ht 68.0 in | Wt 195.4 lb

## 2014-09-05 DIAGNOSIS — J189 Pneumonia, unspecified organism: Secondary | ICD-10-CM | POA: Diagnosis not present

## 2014-09-05 MED ORDER — DOXYCYCLINE HYCLATE 100 MG PO TABS: 100.0000 mg | ORAL_TABLET | Freq: Two times a day (BID) | ORAL | Status: DC

## 2014-09-05 NOTE — Patient Instructions (Signed)
Call for f/u chest x-ray in 3-4 weeks

## 2014-09-05 NOTE — Progress Notes (Signed)
Subjective:     Patient ID: Michael Murphy, male   DOB: 11-06-1927, 81 y.o.   MRN: [id number]  HPI  Chief Complaint  Patient presents with  . Cough    patient comes in office today for recheck, he was recently diagnosed with pneumonia on 08/22/14 and states that cough is still lingering.   Has had recent CXR on 6/15 with possible bilateral pneumonia. States he did not receive intended doxycycline antibiotic. Reports increased shortness of breath with mild back discomfort. Describes sputum as clear. States he has been using a Breo inhaler provided as samples.   Review of Systems  Constitutional: Negative for fever and chills.       Objective:   Physical Exam  Constitutional: He appears well-developed and well-nourished. No distress.  Pulmonary/Chest: He has rales (bilateral basilar).       Assessment:    1. Bilateral pneumonia - doxycycline (VIBRA-TABS) 100 MG tablet; Take 1 tablet (100 mg total) by mouth 2 (two) times daily.  Dispense: 20 tablet; Refill: 0    Plan:    F/u CXR in 3-4 weeks. Return sooner if not improving on antibiotic.

## 2014-09-13 ENCOUNTER — Ambulatory Visit (INDEPENDENT_AMBULATORY_CARE_PROVIDER_SITE_OTHER): Payer: Medicare Other | Admitting: Physician Assistant

## 2014-09-13 ENCOUNTER — Encounter: Payer: Self-pay | Admitting: Physician Assistant

## 2014-09-13 ENCOUNTER — Ambulatory Visit
Admission: RE | Admit: 2014-09-13 | Discharge: 2014-09-13 | Disposition: A | Discharge status: Home / Self Care | Payer: Medicare Other | Source: Ambulatory Visit | Attending: Physician Assistant | Admitting: Physician Assistant

## 2014-09-13 ENCOUNTER — Telehealth: Payer: Self-pay

## 2014-09-13 VITALS — BP 136/58 | HR 91 | Temp 99.0°F | Resp 16 | Wt 194.6 lb

## 2014-09-13 DIAGNOSIS — J189 Pneumonia, unspecified organism: Secondary | ICD-10-CM

## 2014-09-13 DIAGNOSIS — I517 Cardiomegaly: Secondary | ICD-10-CM | POA: Diagnosis not present

## 2014-09-13 DIAGNOSIS — R7981 Abnormal blood-gas level: Secondary | ICD-10-CM | POA: Diagnosis not present

## 2014-09-13 DIAGNOSIS — R918 Other nonspecific abnormal finding of lung field: Secondary | ICD-10-CM | POA: Insufficient documentation

## 2014-09-13 DIAGNOSIS — E119 Type 2 diabetes mellitus without complications: Secondary | ICD-10-CM | POA: Diagnosis not present

## 2014-09-13 DIAGNOSIS — R0602 Shortness of breath: Secondary | ICD-10-CM | POA: Insufficient documentation

## 2014-09-13 DIAGNOSIS — R05 Cough: Secondary | ICD-10-CM | POA: Diagnosis not present

## 2014-09-13 DIAGNOSIS — R059 Cough, unspecified: Secondary | ICD-10-CM

## 2014-09-13 DIAGNOSIS — B351 Tinea unguium: Secondary | ICD-10-CM | POA: Diagnosis not present

## 2014-09-13 MED ORDER — LEVOFLOXACIN 500 MG PO TABS: 500.0000 mg | ORAL_TABLET | Freq: Every day | ORAL | Status: DC

## 2014-09-13 NOTE — Telephone Encounter (Signed)
Left patient a detailed message on home voicemail ( consent in chart)  advising him that the CXR has improved and to keep follow up appointment on 09/20/14.

## 2014-09-13 NOTE — Patient Instructions (Signed)

## 2014-09-13 NOTE — Progress Notes (Signed)
   Subjective:    Patient ID: Michael Murphy, male    DOB: 1927-09-16, 81 y.o.   MRN: [id number]  Pneumonia He complains of chest tightness, cough, difficulty breathing (occasionally when he lies down), shortness of breath (occasional) and sputum production (clear). There is no frequent throat clearing, hemoptysis, hoarse voice or wheezing. This is a new problem. The current episode started 1 to 4 weeks ago. The problem occurs constantly. The problem has been unchanged. The cough is productive of sputum, hacking, harsh and nocturnal. Associated symptoms include dyspnea on exertion and malaise/fatigue. Pertinent negatives include no appetite change, chest pain, ear congestion, ear pain, fever, headaches, heartburn, myalgias, nasal congestion, orthopnea, PND, postnasal drip, rhinorrhea, sneezing, sore throat, sweats, trouble swallowing or weight loss. His symptoms are aggravated by lying down and any activity. His symptoms are alleviated by beta-agonist, change in position and rest. He reports minimal improvement on treatment. Risk factors for lung disease include smoking/tobacco exposure. His past medical history is significant for bronchitis and pneumonia. There is no history of asthma, bronchiectasis, COPD or emphysema.      Review of Systems  Constitutional: Positive for malaise/fatigue and fatigue. Negative for fever, chills, weight loss and appetite change.  HENT: Negative for congestion, ear pain, hoarse voice, postnasal drip, rhinorrhea, sneezing, sore throat, trouble swallowing and voice change.   Eyes: Negative.   Respiratory: Positive for cough, sputum production (clear) and shortness of breath (occasional). Negative for hemoptysis, chest tightness and wheezing.   Cardiovascular: Positive for dyspnea on exertion. Negative for chest pain, palpitations, leg swelling and PND.  Gastrointestinal: Negative.  Negative for heartburn.  Endocrine: Negative.   Musculoskeletal: Negative for myalgias,  neck pain and neck stiffness.  Skin: Negative.   Neurological: Negative for headaches.       Objective:   Physical Exam  Constitutional: He appears well-developed and well-nourished. No distress.  Eyes: Pupils are equal, round, and reactive to light.  Neck: Normal range of motion. Neck supple. No tracheal deviation present. No thyromegaly present.  Cardiovascular: Normal rate, regular rhythm and normal heart sounds.  Exam reveals no gallop and no friction rub.   No murmur heard. Pulmonary/Chest: Effort normal. No accessory muscle usage or stridor. No tachypnea. No respiratory distress. He has no decreased breath sounds. He has no wheezes. He has no rhonchi. He has rales (RLF > LLF) in the right lower field and the left lower field. He exhibits no tenderness.  Lymphadenopathy:    He has no cervical adenopathy.  Skin: Skin is warm and dry. He is not diaphoretic.  No cyanosis  Vitals reviewed.         Assessment & Plan:  1. Bilateral pneumonia Will change doxycycline to levaquin.  Repeat CXR.  Discussed importance of if SOB increases he is to call 9-1-1.  Will continue treatment as outpatient at this time.  RTC in one week for re-evaluation. - levofloxacin (LEVAQUIN) 500 MG tablet; Take 1 tablet (500 mg total) by mouth daily.  Dispense: 14 tablet; Refill: 0 - DG Chest 2 View; Future

## 2014-09-13 NOTE — Telephone Encounter (Signed)
-----   Message from Mar Daring, PA-C sent at 09/13/2014  4:38 PM EDT ----- CXR has improved.  Will re-evaluate in one week in the office and repeat CXR in 4 weeks.  Thanks! -JB

## 2014-09-20 ENCOUNTER — Ambulatory Visit (INDEPENDENT_AMBULATORY_CARE_PROVIDER_SITE_OTHER): Payer: Medicare Other | Admitting: Physician Assistant

## 2014-09-20 ENCOUNTER — Encounter: Payer: Self-pay | Admitting: Physician Assistant

## 2014-09-20 VITALS — BP 124/60 | HR 83 | Temp 97.9°F | Resp 18

## 2014-09-20 DIAGNOSIS — Z8701 Personal history of pneumonia (recurrent): Secondary | ICD-10-CM | POA: Diagnosis not present

## 2014-09-20 DIAGNOSIS — R7981 Abnormal blood-gas level: Secondary | ICD-10-CM | POA: Diagnosis not present

## 2014-09-20 NOTE — Progress Notes (Signed)
Subjective:     Patient ID: Michael Murphy, male   DOB: 1927-10-26, 81 y.o.   MRN: [id number]  HPI Michael Murphy is an 81 year old male that returns to the office today to follow-up shortness of breath and cough with history of bilateral pneumonia. He has completed 2 rounds of antibiotics and had a chest x-ray done that showed improvement in the bilateral pneumonia.  He reports he is feeling much better. His energy level is back to normal. He denies any cough, shortness of breath, hemoptysis, fevers, chills, nausea and vomiting.  Review of Systems  Constitutional: Negative for fever and chills.  HENT: Negative for congestion, postnasal drip, rhinorrhea, sinus pressure, sneezing and sore throat.   Respiratory: Negative for cough, chest tightness, shortness of breath and wheezing.   Cardiovascular: Negative for chest pain and palpitations.  Gastrointestinal: Negative for nausea and vomiting.  Neurological: Negative for dizziness, weakness, light-headedness and headaches.       Objective:   Physical Exam  Constitutional: He appears well-developed and well-nourished. No distress.  Cardiovascular: Normal rate, regular rhythm and normal heart sounds.  Exam reveals no gallop and no friction rub.   No murmur heard. Pulmonary/Chest: Effort normal. No respiratory distress. He has decreased breath sounds. He has no wheezes. He has no rales. He exhibits no tenderness.  Skin: He is not diaphoretic.  Vitals reviewed.      Assessment:     1. History of pneumonia   2. Abnormal pulse oximetry        Plan:     1. History of pneumonia Improvement noted on chest x-ray. He has finished 2 rounds of antibiotics with improvement in symptoms. He is to call the office if he has any recurrence of symptoms or any new issues.  2. Abnormal pulse oximetry Pulse oximetry at the last 2 visits has been 89% which is a decrease from previous readings. He denies any increased shortness of breath. Lung sounds today  on exam were clear, and most recent chest x-ray was within normal limits.  He is to call the office if he has any increase in shortness of breath.

## 2014-09-20 NOTE — Patient Instructions (Signed)

## 2014-10-08 ENCOUNTER — Ambulatory Visit (INDEPENDENT_AMBULATORY_CARE_PROVIDER_SITE_OTHER): Payer: Medicare Other | Admitting: Family Medicine

## 2014-10-08 VITALS — BP 128/56 | HR 74 | Temp 98.1°F | Resp 18 | Wt 194.0 lb

## 2014-10-08 DIAGNOSIS — R05 Cough: Secondary | ICD-10-CM | POA: Diagnosis not present

## 2014-10-08 DIAGNOSIS — R059 Cough, unspecified: Secondary | ICD-10-CM

## 2014-10-08 NOTE — Progress Notes (Signed)
Patient ID: Michael Murphy, male   DOB: 01-22-1928, 81 y.o.   MRN: [id number]   Michael Murphy  MRN: [id number] DOB: [birth year]  Subjective:  HPI   1. Cough Patient is an 81 year old that presents with continued cough.  He was diagnosed on 09/05/14 with bilateral walking pneumonia.  He was treated with Doxycycline and was then seen again on 09/13/14 with continued symptoms and his antibiotic was changed to Levaquin.  Patient was last seen on 09/20/14 with improvement of his symptoms.  However he did have a low PO2 at 89%.  On exam she reported that his lungs were clear.  The patient states today he is better than he has been but he is still having cough especially in the morning when he first gets up and every once in a while during the day. He states that it is minimally productive of clear mucus.  He denies any fever and states his shortness of breath is better than it was.   Patient Active Problem List   Diagnosis Date Noted  . Abnormal laboratory test 07/12/2014  . Carpal tunnel syndrome 07/12/2014  . Epididymitis 07/12/2014  . Post-operative state 07/12/2014  . Adiposity 07/12/2014  . Neurosis, posttraumatic 07/12/2014  . CA of prostate 07/12/2014  . Excessive urination at night 01/23/2013  . ED (erectile dysfunction) of organic origin 01/23/2013  . Diabetic neuropathy 02/12/2009  . Allergic rhinitis 02/11/2007  . Benign essential HTN 02/11/2007  . Arthritis, degenerative 02/11/2007  . OP (osteoporosis) 02/11/2007  . Hypercholesterolemia without hypertriglyceridemia 02/11/2007  . Current tobacco use 02/11/2007    No past medical history on file.  History   Social History  . Marital Status: Widowed    Spouse Name: N/A  . Number of Children: N/A  . Years of Education: N/A   Occupational History  . Not on file.   Social History Main Topics  . Smoking status: Former Smoker -- 0.50 packs/day for 18 years    Types: Cigarettes    Quit date: 03/09/1991  . Smokeless tobacco: Not on  file  . Alcohol Use: 1.2 oz/week    2 Standard drinks or equivalent per week     Comment: beers  . Drug Use: No  . Sexual Activity: Not on file   Other Topics Concern  . Not on file   Social History Narrative    Outpatient Prescriptions Prior to Visit  Medication Sig Dispense Refill  . alendronate (FOSAMAX) 70 MG tablet TAKE 1 TABLET EVERY WEEK FOR OSTEOPOROSIS 12 tablet 3  . amLODipine (NORVASC) 10 MG tablet Take 1 tablet by mouth daily.    Marland Kitchen docusate sodium (COLACE) 100 MG capsule Take 2 capsules by mouth daily.    Marland Kitchen FLAXSEED, LINSEED, PO Take 2 capsules by mouth daily.    . Garlic Oil 4174 MG CAPS Take 1 capsule by mouth daily.    . metFORMIN (GLUCOPHAGE) 500 MG tablet Take 1 tablet by mouth daily.    . Omega-3 Fatty Acids (FISH OIL) 1000 MG CAPS Take 1 capsule by mouth daily.    . simvastatin (ZOCOR) 20 MG tablet Take 1 tablet by mouth at bedtime.    Marland Kitchen levofloxacin (LEVAQUIN) 500 MG tablet Take 1 tablet (500 mg total) by mouth daily. 14 tablet 0   No facility-administered medications prior to visit.    Allergies  Allergen Reactions  . Accupril  [Quinapril Hcl] Swelling    throat swells    Review of Systems  Constitutional: Negative for  fever, chills, weight loss, malaise/fatigue and diaphoresis.  Eyes: Negative.   Respiratory: Positive for cough, sputum production and shortness of breath (Some but much improved.). Negative for hemoptysis and wheezing.   Cardiovascular: Negative for chest pain, palpitations, orthopnea and leg swelling.  Gastrointestinal: Negative.   Genitourinary: Negative.   Musculoskeletal: Negative.   Skin: Negative.   Neurological: Positive for headaches (For three days the patient has woke up with a slight headache on the left side of his head.  He states once he is up and about and drinks his coffee it goes away.). Negative for dizziness and weakness.  Psychiatric/Behavioral: Negative.    Objective:  BP 128/56 mmHg  Pulse 74  Temp(Src) 98.1  F (36.7 C) (Oral)  Resp 18  Wt 194 lb (87.998 kg)  SpO2 97%  Physical Exam  Constitutional: He is well-developed, well-nourished, and in no distress.  HENT:  Head: Normocephalic and atraumatic.  Right Ear: External ear normal.  Left Ear: External ear normal.  Nose: Nose normal.  Mouth/Throat: Oropharynx is clear and moist.  Neck: Normal range of motion. Neck supple.  Cardiovascular: Normal rate, regular rhythm and normal heart sounds.   Pulmonary/Chest: Effort normal and breath sounds normal.  Skin: Skin is warm and dry.  Psychiatric: Mood, memory, affect and judgment normal.  Vitals reviewed.   Assessment and Plan :   1. Cough Patient is improving.  His oxygen level is better and his lungs sound better.  No changes or further treatment at this time.  Continue inhaler as needed.  If he develops symptoms he is to let us know otherwise we will see him back in 3-4 weeks as long as he continues to improve.  Will obtain CXR on next visit.  Will provide him with samples of Breo today, # 2-Lot # [id number] Exp 1/18.   2. Walking pneumonia  Clinically this is resolved. Return to clinic in another month for repeat chest x-ray as long as he continues to improve. Miguel Aschoff MD [facility] 10/08/2014 3:09 PM

## 2014-10-10 ENCOUNTER — Encounter: Payer: Self-pay | Admitting: Family Medicine

## 2014-10-30 ENCOUNTER — Ambulatory Visit
Admission: RE | Admit: 2014-10-30 | Discharge: 2014-10-30 | Disposition: A | Discharge status: Home / Self Care | Payer: Medicare Other | Source: Ambulatory Visit | Attending: Family Medicine | Admitting: Family Medicine

## 2014-10-30 ENCOUNTER — Ambulatory Visit (INDEPENDENT_AMBULATORY_CARE_PROVIDER_SITE_OTHER): Payer: Medicare Other | Admitting: Family Medicine

## 2014-10-30 VITALS — BP 144/82 | HR 88 | Temp 98.2°F | Resp 16 | Wt 192.0 lb

## 2014-10-30 DIAGNOSIS — R05 Cough: Secondary | ICD-10-CM | POA: Diagnosis not present

## 2014-10-30 DIAGNOSIS — J189 Pneumonia, unspecified organism: Secondary | ICD-10-CM | POA: Insufficient documentation

## 2014-10-30 NOTE — Progress Notes (Signed)
Patient ID: Michael Murphy, male   DOB: 08/10/1927, 81 y.o.   MRN: [id number]   ISAIYAH FELDHAUS  MRN: [id number] DOB: Dec 10, 1927  Subjective:  HPI   1. Pneumonia, organism unspecified Patient is an 81 year old male who presents for follow up on his pneumonia.  The patient was diagnosed with pneumonia a couple of months ago.  He has been treated and presents today for follow up with CXR.  The patient was last seen for this problem on 10/08/14 and clinically was doing better.  He reports to feeling well with minor symptoms still present.  He said that he still has some cough and some mild shortness of breath.  He is still using the Breo inhaler and said his breathing is much better than it was but not totally back to normal.  He thinks his energy level has improved but again not quite to his normal.   Patient Active Problem List   Diagnosis Date Noted  . Abnormal laboratory test 07/12/2014  . Carpal tunnel syndrome 07/12/2014  . Epididymitis 07/12/2014  . Post-operative state 07/12/2014  . Adiposity 07/12/2014  . Neurosis, posttraumatic 07/12/2014  . CA of prostate 07/12/2014  . Excessive urination at night 01/23/2013  . ED (erectile dysfunction) of organic origin 01/23/2013  . Diabetic neuropathy 02/12/2009  . Allergic rhinitis 02/11/2007  . Benign essential HTN 02/11/2007  . Arthritis, degenerative 02/11/2007  . OP (osteoporosis) 02/11/2007  . Hypercholesterolemia without hypertriglyceridemia 02/11/2007  . Current tobacco use 02/11/2007    No past medical history on file.  Social History   Social History  . Marital Status: Widowed    Spouse Name: N/A  . Number of Children: N/A  . Years of Education: N/A   Occupational History  . Not on file.   Social History Main Topics  . Smoking status: Former Smoker -- 0.50 packs/day for 18 years    Types: Cigarettes    Quit date: 03/09/1991  . Smokeless tobacco: Not on file  . Alcohol Use: 1.2 oz/week    2 Standard drinks or equivalent  per week     Comment: beers  . Drug Use: No  . Sexual Activity: Not on file   Other Topics Concern  . Not on file   Social History Narrative    Outpatient Prescriptions Prior to Visit  Medication Sig Dispense Refill  . alendronate (FOSAMAX) 70 MG tablet TAKE 1 TABLET EVERY WEEK FOR OSTEOPOROSIS 12 tablet 3  . amLODipine (NORVASC) 10 MG tablet Take 1 tablet by mouth daily.    Marland Kitchen docusate sodium (COLACE) 100 MG capsule Take 2 capsules by mouth daily.    Marland Kitchen FLAXSEED, LINSEED, PO Take 2 capsules by mouth daily.    . Garlic Oil 0623 MG CAPS Take 1 capsule by mouth daily.    . metFORMIN (GLUCOPHAGE) 500 MG tablet Take 1 tablet by mouth daily.    . Omega-3 Fatty Acids (FISH OIL) 1000 MG CAPS Take 1 capsule by mouth daily.    . simvastatin (ZOCOR) 20 MG tablet Take 1 tablet by mouth at bedtime.     No facility-administered medications prior to visit.    Allergies  Allergen Reactions  . Accupril  [Quinapril Hcl] Swelling    throat swells    Review of Systems  Constitutional: Negative.   Eyes: Negative.   Respiratory: Positive for cough and shortness of breath. Negative for hemoptysis, sputum production and wheezing.   Cardiovascular: Negative.   Gastrointestinal: Negative.   Genitourinary: Negative.  Skin: Negative.   Neurological: Negative for dizziness and headaches.  Psychiatric/Behavioral: Negative.    Objective:  BP 144/82 mmHg  Pulse 88  Temp(Src) 98.2 F (36.8 C) (Oral)  Resp 16  Wt 192 lb (87.091 kg)  Physical Exam  Constitutional: He is oriented to person, place, and time and well-developed, well-nourished, and in no distress.  HENT:  Head: Normocephalic and atraumatic.  Right Ear: External ear normal.  Left Ear: External ear normal.  Nose: Nose normal.  Eyes: Conjunctivae are normal.  Neck: Normal range of motion. Neck supple.  Cardiovascular: Normal rate, regular rhythm, normal heart sounds and intact distal pulses.   Pulmonary/Chest: Effort normal and  breath sounds normal.  Abdominal: Soft.  Neurological: He is alert and oriented to person, place, and time.  Skin: Skin is warm and dry.  Psychiatric: Mood, memory, affect and judgment normal.    Assessment and Plan :   1. Pneumonia, organism unspecified Patient clinically improved. - DG Chest 2 View; Future May need spirometry on next visit to make sure there is no underlying lung disease affecting patient's quality of life. 2. Chronic constipation Metamucil daily and then if after a week does not help we'll add GlycoLax daily to the regimen. I have done the exam and reviewed the above chart and it is accurate to the best of my knowledge.   Miguel Aschoff MD [facility] 10/30/2014 2:22 PM

## 2014-10-30 NOTE — Patient Instructions (Signed)
Patient is to use Metamucil daily for one week.  If he does not have good results he is to add Glucolax.

## 2014-11-01 ENCOUNTER — Telehealth: Payer: Self-pay | Admitting: Family Medicine

## 2014-11-01 NOTE — Telephone Encounter (Signed)
Pt is calling to request the results from a chest x-ray.  CB#[phone]

## 2014-11-01 NOTE — Telephone Encounter (Signed)
Pneumonia has cleared!

## 2014-11-01 NOTE — Telephone Encounter (Signed)
See below please-aa 

## 2014-11-02 NOTE — Telephone Encounter (Signed)
Pt advised-aa 

## 2014-12-21 ENCOUNTER — Encounter: Payer: Self-pay | Admitting: Family Medicine

## 2014-12-21 ENCOUNTER — Ambulatory Visit (INDEPENDENT_AMBULATORY_CARE_PROVIDER_SITE_OTHER): Payer: Medicare Other | Admitting: Family Medicine

## 2014-12-21 VITALS — BP 112/56 | HR 89 | Temp 98.0°F | Resp 16 | Wt 197.8 lb

## 2014-12-21 DIAGNOSIS — R05 Cough: Secondary | ICD-10-CM

## 2014-12-21 DIAGNOSIS — J8410 Pulmonary fibrosis, unspecified: Secondary | ICD-10-CM | POA: Diagnosis not present

## 2014-12-21 DIAGNOSIS — R059 Cough, unspecified: Secondary | ICD-10-CM

## 2014-12-21 MED ORDER — BUDESONIDE 180 MCG/ACT IN AEPB: 2.0000 | INHALATION_SPRAY | Freq: Two times a day (BID) | RESPIRATORY_TRACT | Status: DC

## 2014-12-21 NOTE — Progress Notes (Signed)
Patient ID: Michael Murphy, male   DOB: 05-09-1927, 81 y.o.   MRN: [id number] Name: Michael Murphy   MRN: [id number]    DOB: [birth year]   Date:12/21/2014       Progress Note  Subjective  Chief Complaint  Chief Complaint  Patient presents with  . Cough   Cough This is a recurrent problem. The current episode started more than 1 month ago. The problem has been gradually improving. The problem occurs every few hours (usually each morning). The cough is productive of sputum. Associated symptoms include nasal congestion, postnasal drip and shortness of breath. Pertinent negatives include no chest pain, chills or hemoptysis. Associated symptoms comments: Rare wheeze and short of breath with heavy exertion.. He has tried OTC cough suppressant for the symptoms. The treatment provided moderate relief. His past medical history is significant for pneumonia.  Was a smoker for 18 years and stopped in 1992. Had some pneumonia in June 2016.  Patient Active Problem List   Diagnosis Date Noted  . Abnormal laboratory test 07/12/2014  . Carpal tunnel syndrome 07/12/2014  . Epididymitis 07/12/2014  . Post-operative state 07/12/2014  . Adiposity 07/12/2014  . Neurosis, posttraumatic 07/12/2014  . CA of prostate ([place]) 07/12/2014  . Excessive urination at night 01/23/2013  . ED (erectile dysfunction) of organic origin 01/23/2013  . Malignant neoplasm of prostate ([place]) 12/07/2011  . Diabetic neuropathy ([place]) 02/12/2009  . Allergic rhinitis 02/11/2007  . Benign essential HTN 02/11/2007  . Arthritis, degenerative 02/11/2007  . OP (osteoporosis) 02/11/2007  . Hypercholesterolemia without hypertriglyceridemia 02/11/2007  . Current tobacco use 02/11/2007   Past Surgical History  Procedure Laterality Date  . Prostatectomy    . Cataract extraction    . Rotator cuff repair    . Knee arthroscopy      left   Family History  Problem Relation Age of Onset  . Hypertension Mother   . Stroke Mother   . Diabetes  Sister   . Hypertension Sister   . Diabetes Brother   . Diabetes Brother    Social History  Substance Use Topics  . Smoking status: Former Smoker -- 0.50 packs/day for 18 years    Types: Cigarettes    Quit date: 03/09/1991  . Smokeless tobacco: Not on file  . Alcohol Use: 1.2 oz/week    2 Standard drinks or equivalent per week     Comment: beers    Current outpatient prescriptions:  .  alendronate (FOSAMAX) 70 MG tablet, TAKE 1 TABLET EVERY WEEK FOR OSTEOPOROSIS, Disp: 12 tablet, Rfl: 3 .  amLODipine (NORVASC) 10 MG tablet, Take 1 tablet by mouth daily., Disp: , Rfl:  .  docusate sodium (COLACE) 100 MG capsule, Take 2 capsules by mouth daily., Disp: , Rfl:  .  FLAXSEED, LINSEED, PO, Take 2 capsules by mouth daily., Disp: , Rfl:  .  Garlic Oil 5573 MG CAPS, Take 1 capsule by mouth daily., Disp: , Rfl:  .  metFORMIN (GLUCOPHAGE) 500 MG tablet, Take 1 tablet by mouth daily., Disp: , Rfl:  .  Omega-3 Fatty Acids (FISH OIL) 1000 MG CAPS, Take 1 capsule by mouth daily., Disp: , Rfl:  .  simvastatin (ZOCOR) 20 MG tablet, Take 1 tablet by mouth at bedtime., Disp: , Rfl:   Allergies  Allergen Reactions  . Accupril  [Quinapril Hcl] Swelling    throat swells   Review of Systems  Constitutional: Negative.  Negative for chills.  HENT: Positive for postnasal drip.   Eyes: Negative.  Respiratory: Positive for cough and shortness of breath. Negative for hemoptysis.   Cardiovascular: Negative.  Negative for chest pain.  Gastrointestinal: Negative.   Genitourinary: Negative.   Musculoskeletal: Negative.   Skin: Negative.   Neurological: Negative.   Endo/Heme/Allergies: Negative.   Psychiatric/Behavioral: Negative.    Objective  Filed Vitals:   12/21/14 1127  BP: 112/56  Pulse: 89  Temp: 98 F (36.7 C)  TempSrc: Oral  Resp: 16  Weight: 197 lb 12.8 oz (89.721 kg)  SpO2: 93%   Physical Exam  Constitutional: He is oriented to person, place, and time and well-developed,  well-nourished, and in no distress.  HENT:  Head: Normocephalic and atraumatic.  Eyes: Conjunctivae and EOM are normal.  Bilateral arcus  Neck: Neck supple.  Cardiovascular: Normal rate and regular rhythm.   Pulmonary/Chest: Effort normal and breath sounds normal.  Abdominal: Soft. Bowel sounds are normal.  Neurological: He is alert and oriented to person, place, and time.  Psychiatric: Affect and judgment normal.   Assessment & Plan  1. Cough No significant cough or dyspnea at this time. Getting relief from OTC cough syrup and cough lozenges. Spirometry essentially normal for his age. Pulse oximetry 93% at rest on room air. Suspect cough secondary to reactive airway secondary to past smoking and pneumonia. Given sample of Pulmicort and recheck in a month if any further cough persisting. - Spirometry with Graph - budesonide (PULMICORT) 180 MCG/ACT inhaler; Inhale 2 puffs into the lungs 2 (two) times daily.  Dispense: 1 Inhaler; Refill: 0  2. Pulmonary interstitial fibrosis ([place]) Documented on CXR 10-30-14 with history of smoking for 18 years (quit in 1991) and bout of pneumonia in June 2016. Persistent cough treated with Pulmicort and OTC cough syrup prn. Recheck prn.

## 2014-12-26 LAB — BASIC METABOLIC PANEL
BUN: 9 mg/dL (ref 4–21)
Creatinine: 0.9 mg/dL (ref 0.6–1.3)
Glucose: 98 mg/dL
Potassium: 4.6 mmol/L (ref 3.4–5.3)
Sodium: 139 mmol/L (ref 137–147)

## 2015-01-22 DIAGNOSIS — Z08 Encounter for follow-up examination after completed treatment for malignant neoplasm: Secondary | ICD-10-CM | POA: Diagnosis not present

## 2015-01-22 DIAGNOSIS — Z8546 Personal history of malignant neoplasm of prostate: Secondary | ICD-10-CM | POA: Diagnosis not present

## 2015-01-22 DIAGNOSIS — N529 Male erectile dysfunction, unspecified: Secondary | ICD-10-CM | POA: Diagnosis not present

## 2015-01-22 DIAGNOSIS — B351 Tinea unguium: Secondary | ICD-10-CM | POA: Diagnosis not present

## 2015-01-22 DIAGNOSIS — E119 Type 2 diabetes mellitus without complications: Secondary | ICD-10-CM | POA: Diagnosis not present

## 2015-01-22 DIAGNOSIS — N433 Hydrocele, unspecified: Secondary | ICD-10-CM | POA: Diagnosis not present

## 2015-04-24 DIAGNOSIS — E119 Type 2 diabetes mellitus without complications: Secondary | ICD-10-CM | POA: Diagnosis not present

## 2015-04-24 DIAGNOSIS — B351 Tinea unguium: Secondary | ICD-10-CM | POA: Diagnosis not present

## 2015-05-02 ENCOUNTER — Ambulatory Visit (INDEPENDENT_AMBULATORY_CARE_PROVIDER_SITE_OTHER): Payer: Medicare Other | Admitting: Family Medicine

## 2015-05-02 VITALS — BP 104/62 | HR 72 | Temp 97.6°F | Resp 20 | Wt 202.0 lb

## 2015-05-02 DIAGNOSIS — I10 Essential (primary) hypertension: Secondary | ICD-10-CM

## 2015-05-02 DIAGNOSIS — E119 Type 2 diabetes mellitus without complications: Secondary | ICD-10-CM | POA: Diagnosis not present

## 2015-05-02 DIAGNOSIS — E7800 Pure hypercholesterolemia, unspecified: Secondary | ICD-10-CM | POA: Diagnosis not present

## 2015-05-02 LAB — POCT GLYCOSYLATED HEMOGLOBIN (HGB A1C): Hemoglobin A1C: 6.4

## 2015-05-02 NOTE — Progress Notes (Signed)
Patient ID: Michael Murphy, male   DOB: 02/14/1928, 83 y.o.   MRN: [id number]    Subjective:  HPI  Patient is here for 6 months follow up. He feels well and has no complaints. He is followed at the [place] and had lab work there last year. Diabetes: Patient checks his sugar once a week on Monday and readings are usually around 120, 109. NO hypoglycemic episodes. Lab Results  Component Value Date   HGBA1C 6.5* 09/18/2013   Hypertension: patient has not been checking his b/p. No cardiac symptoms. BP Readings from Last 3 Encounters:  05/02/15 104/62  12/21/14 112/56  10/30/14 144/82   Patient states he had labs done through [place] in summer 2016.   Prior to Admission medications   Medication Sig Start Date End Date Taking? Authorizing Provider  alendronate (FOSAMAX) 70 MG tablet TAKE 1 TABLET EVERY WEEK FOR OSTEOPOROSIS 08/23/14  Yes Janny Crute Maceo Pro., MD  amLODipine (NORVASC) 10 MG tablet Take 1 tablet by mouth daily. 10/15/10  Yes Historical Provider, MD  budesonide (PULMICORT) 180 MCG/ACT inhaler Inhale 2 puffs into the lungs 2 (two) times daily. 12/21/14  Yes Dennis E Chrismon, PA  docusate sodium (COLACE) 100 MG capsule Take 2 capsules by mouth daily.   Yes Historical Provider, MD  FLAXSEED, LINSEED, PO Take 2 capsules by mouth daily. 02/25/10  Yes Historical Provider, MD  Garlic Oil 6004 MG CAPS Take 1 capsule by mouth daily. 04/14/07  Yes Historical Provider, MD  metFORMIN (GLUCOPHAGE) 500 MG tablet Take 1 tablet by mouth daily.   Yes Historical Provider, MD  Omega-3 Fatty Acids (FISH OIL) 1000 MG CAPS Take 1 capsule by mouth daily. 04/14/07  Yes Historical Provider, MD  simvastatin (ZOCOR) 20 MG tablet Take 1 tablet by mouth at bedtime.   Yes Historical Provider, MD    Patient Active Problem List   Diagnosis Date Noted  . Abnormal laboratory test 07/12/2014  . Carpal tunnel syndrome 07/12/2014  . Epididymitis 07/12/2014  . Post-operative state 07/12/2014  . Adiposity 07/12/2014  .  Neurosis, posttraumatic 07/12/2014  . CA of prostate ([place]) 07/12/2014  . Excessive urination at night 01/23/2013  . ED (erectile dysfunction) of organic origin 01/23/2013  . Malignant neoplasm of prostate ([place]) 12/07/2011  . Diabetic neuropathy ([place]) 02/12/2009  . Allergic rhinitis 02/11/2007  . Benign essential HTN 02/11/2007  . Arthritis, degenerative 02/11/2007  . OP (osteoporosis) 02/11/2007  . Hypercholesterolemia without hypertriglyceridemia 02/11/2007  . Current tobacco use 02/11/2007    No past medical history on file.  Social History   Social History  . Marital Status: Widowed    Spouse Name: N/A  . Number of Children: N/A  . Years of Education: N/A   Occupational History  . Not on file.   Social History Main Topics  . Smoking status: Former Smoker -- 0.50 packs/day for 18 years    Types: Cigarettes    Quit date: 03/09/1991  . Smokeless tobacco: Not on file  . Alcohol Use: 1.2 oz/week    2 Standard drinks or equivalent per week     Comment: beers  . Drug Use: No  . Sexual Activity: Not on file   Other Topics Concern  . Not on file   Social History Narrative    Allergies  Allergen Reactions  . Accupril  [Quinapril Hcl] Swelling    throat swells    Review of Systems  Constitutional: Negative.   Respiratory: Negative.   Cardiovascular: Negative.   Genitourinary: Positive for  frequency.    Immunization History  Administered Date(s) Administered  . Influenza-Unspecified 12/08/2014  . Pneumococcal Conjugate-13 09/18/2013, 08/22/2014  . Tdap 12/26/2010   Objective:  BP 104/62 mmHg  Pulse 72  Temp(Src) 97.6 F (36.4 C)  Resp 20  Wt 202 lb (91.627 kg)  Physical Exam  Constitutional: He is oriented to person, place, and time and well-developed, well-nourished, and in no distress.  Well-nourished well-developed black male in no acute distress who looks much younger than his age of 59  HENT:  Head: Normocephalic and atraumatic.  Right Ear:  External ear normal.  Left Ear: External ear normal.  Nose: Nose normal.  Eyes: Conjunctivae are normal. Pupils are equal, round, and reactive to light.  Neck: Normal range of motion. Neck supple.  Cardiovascular: Normal rate, regular rhythm, normal heart sounds and intact distal pulses.   No murmur heard. Pulmonary/Chest: Effort normal and breath sounds normal. No respiratory distress. He has no wheezes.  Abdominal: Soft.  Musculoskeletal: He exhibits no edema or tenderness.  Neurological: He is alert and oriented to person, place, and time.  Skin: Skin is warm and dry.  Psychiatric: Mood, memory, affect and judgment normal.    Lab Results  Component Value Date   WBC 6.7 09/18/2013   HGB 14.3 09/18/2013   HCT 43 09/18/2013   PLT 165 09/18/2013   GLUCOSE 121* 10/15/2008   CHOL 187 09/18/2013   TRIG 69 09/18/2013   HDL 68 09/18/2013   LDLCALC 105 09/18/2013   PSA 0.7 01/23/2013   HGBA1C 6.5* 09/18/2013    CMP     Component Value Date/Time   NA 143 09/18/2013   NA 139 10/15/2008 1200   K 4.5 09/18/2013   CL 105 10/15/2008 1200   CO2 27 10/15/2008 1200   GLUCOSE 121* 10/15/2008 1200   BUN 12 09/18/2013   BUN 11 10/15/2008 1200   CREATININE 1.0 09/18/2013   CREATININE 0.83 10/15/2008 1200   CALCIUM 9.3 10/15/2008 1200   AST 21 09/18/2013   ALT 20 09/18/2013   ALKPHOS 46 09/18/2013   GFRNONAA >60 10/15/2008 1200   GFRAA  10/15/2008 1200    >60        The eGFR has been calculated using the MDRD equation. This calculation has not been validated in all clinical situations. eGFR's persistently <60 mL/min signify possible Chronic Kidney Disease.    Assessment and Plan :  1. Benign essential HTN Stable today.  2. Hypercholesterolemia without hypertriglyceridemia Discussed with patient that we need to get labs he has done at the [place]. Will review these when we get the results. Patient understood.  3. Type 2 diabetes mellitus without complication, without  long-term current use of insulin (HCC) A1C 6.4 today--was 6.5. Stable. Continue current medications.goal A1c of 7.5 to 8.0  I have done the exam and reviewed the above chart and it is accurate to the best of my knowledge.  Miguel Aschoff MD [facility] 05/02/2015 1:41 PM

## 2015-05-24 ENCOUNTER — Encounter: Payer: Self-pay | Admitting: Family Medicine

## 2015-05-24 ENCOUNTER — Ambulatory Visit: Payer: Medicare Other

## 2015-05-24 ENCOUNTER — Ambulatory Visit (INDEPENDENT_AMBULATORY_CARE_PROVIDER_SITE_OTHER): Payer: Medicare Other | Admitting: Family Medicine

## 2015-05-24 VITALS — BP 154/68 | HR 102 | Temp 98.0°F | Resp 16 | Wt 208.0 lb

## 2015-05-24 DIAGNOSIS — J069 Acute upper respiratory infection, unspecified: Secondary | ICD-10-CM | POA: Diagnosis not present

## 2015-05-24 NOTE — Patient Instructions (Signed)
Use nasal saline irrigations to help with nasal congestion. May use over the counter Robitussin to help with congestion.  Continue Flonase nasal spray, and use Afrin nasal spray for 2 days.

## 2015-05-24 NOTE — Progress Notes (Signed)
Patient ID: Michael Murphy, male   DOB: 1927/07/29, 82 y.o.   MRN: [id number]       Patient: Michael Murphy Male    DOB: 08-30-1927   82 y.o.   MRN: [id number] Visit Date: 05/24/2015  Today's Provider: [name], MD   Chief Complaint  Patient presents with  . URI    X 2 days.    Subjective:    URI  This is a new problem. The current episode started in the past 7 days. The problem has been gradually worsening. There has been no fever. Associated symptoms include congestion, a plugged ear sensation, rhinorrhea and sinus pain. He has tried decongestant for the symptoms. The treatment provided no relief.       Allergies  Allergen Reactions  . Accupril  [Quinapril Hcl] Swelling    throat swells   Previous Medications   ALENDRONATE (FOSAMAX) 70 MG TABLET    TAKE 1 TABLET EVERY WEEK FOR OSTEOPOROSIS   AMLODIPINE (NORVASC) 10 MG TABLET    Take 1 tablet by mouth daily.   BUDESONIDE (PULMICORT) 180 MCG/ACT INHALER    Inhale 2 puffs into the lungs 2 (two) times daily.   DOCUSATE SODIUM (COLACE) 100 MG CAPSULE    Take 2 capsules by mouth daily.   FLAXSEED, LINSEED, PO    Take 2 capsules by mouth daily.   GARLIC OIL 123XX123 MG CAPS    Take 1 capsule by mouth daily.   METFORMIN (GLUCOPHAGE) 500 MG TABLET    Take 1 tablet by mouth daily.   OMEGA-3 FATTY ACIDS (FISH OIL) 1000 MG CAPS    Take 1 capsule by mouth daily.   SIMVASTATIN (ZOCOR) 20 MG TABLET    Take 1 tablet by mouth at bedtime.    Review of Systems  Constitutional: Negative.   HENT: Positive for congestion, postnasal drip, rhinorrhea and sinus pressure.   Respiratory: Negative.     Social History  Substance Use Topics  . Smoking status: Former Smoker -- 0.50 packs/day for 18 years    Types: Cigarettes    Quit date: 03/09/1991  . Smokeless tobacco: Not on file  . Alcohol Use: 1.2 oz/week    2 Standard drinks or equivalent per week     Comment: beers   Objective:   BP 154/68 mmHg  Pulse 102  Temp(Src) 98 F (36.7  C)  Resp 16  Wt 208 lb (94.348 kg)  SpO2 98%  Physical Exam  Constitutional: He is oriented to person, place, and time. He appears well-developed and well-nourished.  HENT:  Head: Normocephalic and atraumatic.  Right Ear: External ear normal.  Left Ear: External ear normal.  Cardiovascular: Normal rate and regular rhythm.   Pulmonary/Chest: Effort normal and breath sounds normal.  Abdominal: Soft.  Neurological: He is alert and oriented to person, place, and time.  Skin: Skin is warm and dry.  Psychiatric: He has a normal mood and affect. His behavior is normal. Judgment and thought content normal.        Assessment & Plan:     1. Upper respiratory infection Treat with doxycycline if he worsens. Discussed fluids, rest, Robitussin-DM for the cough. 2. Cough and URI I have done the exam and reviewed the above chart and it is accurate to the best of my knowledge.       Shayda Kalka Cranford Mon, MD  [facility]

## 2015-06-11 ENCOUNTER — Encounter: Payer: Self-pay | Admitting: Family Medicine

## 2015-07-08 ENCOUNTER — Other Ambulatory Visit: Payer: Self-pay | Admitting: Family Medicine

## 2015-07-08 MED ORDER — ALENDRONATE SODIUM 70 MG PO TABS: 70.0000 mg | ORAL_TABLET | ORAL | Status: DC

## 2015-07-08 NOTE — Telephone Encounter (Signed)
rx sent in-aa 

## 2015-07-08 NOTE — Telephone Encounter (Signed)
Patient requesting refill for alendronate (FOSAMAX) 70 MG tablet   Mail order that is on file

## 2015-07-22 DIAGNOSIS — E119 Type 2 diabetes mellitus without complications: Secondary | ICD-10-CM | POA: Diagnosis not present

## 2015-07-22 DIAGNOSIS — B351 Tinea unguium: Secondary | ICD-10-CM | POA: Diagnosis not present

## 2015-10-08 ENCOUNTER — Ambulatory Visit
Admission: RE | Admit: 2015-10-08 | Discharge: 2015-10-08 | Disposition: A | Discharge status: Home / Self Care | Payer: Medicare Other | Source: Ambulatory Visit | Attending: Family Medicine | Admitting: Family Medicine

## 2015-10-08 ENCOUNTER — Ambulatory Visit (INDEPENDENT_AMBULATORY_CARE_PROVIDER_SITE_OTHER): Payer: Medicare Other | Admitting: Family Medicine

## 2015-10-08 VITALS — BP 132/70 | HR 88 | Temp 97.8°F | Resp 16 | Wt 213.0 lb

## 2015-10-08 DIAGNOSIS — M1611 Unilateral primary osteoarthritis, right hip: Secondary | ICD-10-CM | POA: Diagnosis not present

## 2015-10-08 DIAGNOSIS — M25551 Pain in right hip: Secondary | ICD-10-CM

## 2015-10-08 DIAGNOSIS — M545 Low back pain: Secondary | ICD-10-CM | POA: Diagnosis not present

## 2015-10-08 DIAGNOSIS — M5136 Other intervertebral disc degeneration, lumbar region: Secondary | ICD-10-CM | POA: Diagnosis not present

## 2015-10-08 DIAGNOSIS — M1612 Unilateral primary osteoarthritis, left hip: Secondary | ICD-10-CM | POA: Diagnosis not present

## 2015-10-08 MED ORDER — NAPROXEN 500 MG PO TABS: 500.0000 mg | ORAL_TABLET | Freq: Two times a day (BID) | ORAL | 0 refills | Status: DC

## 2015-10-08 MED ORDER — HYDROCODONE-ACETAMINOPHEN 5-325 MG PO TABS: 1.0000 | ORAL_TABLET | Freq: Four times a day (QID) | ORAL | 0 refills | Status: DC | PRN

## 2015-10-08 NOTE — Progress Notes (Signed)
Michael Murphy  MRN: [id number] DOB: 1927/12/14  Subjective:  HPI  The patient is an 82 year old male who presents for evaluation of back pain and right hip pain.  He states it began 4-5 days ago.  He has been using an OTC arthritis medication but has not yet seen any relief.   He states that the pain is worse when he is lying in bed.  He further states that when he is walking it actually feels a little better. His pain when scaled at the worst is 7-8/10, after taking the medication he does not see any change. There has been no known trauma to the back or hips.  Patient Active Problem List   Diagnosis Date Noted  . Abnormal laboratory test 07/12/2014  . Carpal tunnel syndrome 07/12/2014  . Epididymitis 07/12/2014  . Post-operative state 07/12/2014  . Adiposity 07/12/2014  . Neurosis, posttraumatic 07/12/2014  . CA of prostate ([place]) 07/12/2014  . Excessive urination at night 01/23/2013  . ED (erectile dysfunction) of organic origin 01/23/2013  . Malignant neoplasm of prostate ([place]) 12/07/2011  . Diabetic neuropathy ([facility]) 02/12/2009  . Allergic rhinitis 02/11/2007  . Benign essential HTN 02/11/2007  . Arthritis, degenerative 02/11/2007  . OP (osteoporosis) 02/11/2007  . Hypercholesterolemia without hypertriglyceridemia 02/11/2007  . Current tobacco use 02/11/2007    No past medical history on file.  Social History   Social History  . Marital status: Widowed    Spouse name: N/A  . Number of children: N/A  . Years of education: N/A   Occupational History  . Not on file.   Social History Main Topics  . Smoking status: Former Smoker    Packs/day: 0.50    Years: 18.00    Types: Cigarettes    Quit date: 03/09/1991  . Smokeless tobacco: Not on file  . Alcohol use 1.2 oz/week    2 Standard drinks or equivalent per week     Comment: beers  . Drug use: No  . Sexual activity: Not on file   Other Topics Concern  . Not on file   Social History Narrative  . No narrative  on file    Outpatient Medications Prior to Visit  Medication Sig Dispense Refill  . alendronate (FOSAMAX) 70 MG tablet Take 1 tablet (70 mg total) by mouth once a week. Take with a full glass of water on an empty stomach. 12 tablet 3  . amLODipine (NORVASC) 10 MG tablet Take 1 tablet by mouth daily.    . budesonide (PULMICORT) 180 MCG/ACT inhaler Inhale 2 puffs into the lungs 2 (two) times daily. 1 Inhaler 0  . docusate sodium (COLACE) 100 MG capsule Take 2 capsules by mouth daily.    Marland Kitchen FLAXSEED, LINSEED, PO Take 2 capsules by mouth daily.    . Garlic Oil 123XX123 MG CAPS Take 1 capsule by mouth daily.    . metFORMIN (GLUCOPHAGE) 500 MG tablet Take 1 tablet by mouth daily.    . Omega-3 Fatty Acids (FISH OIL) 1000 MG CAPS Take 1 capsule by mouth daily.    . simvastatin (ZOCOR) 20 MG tablet Take 1 tablet by mouth at bedtime.     No facility-administered medications prior to visit.     Allergies  Allergen Reactions  . Accupril  [Quinapril Hcl] Swelling    throat swells  . Quinapril Swelling    Review of Systems  Constitutional: Negative for fever and malaise/fatigue.  Respiratory: Negative for cough, shortness of breath and wheezing.  Cardiovascular: Negative for chest pain, palpitations, orthopnea and leg swelling.  Genitourinary: Negative for dysuria, flank pain, frequency, hematuria and urgency.  Musculoskeletal: Positive for back pain and joint pain (hips and knees). Negative for falls, myalgias and neck pain.  Neurological: Negative for dizziness, weakness and headaches.   Objective:  BP 132/70   Pulse 88   Temp 97.8 F (36.6 C) (Oral)   Resp 16   Wt 213 lb (96.6 kg)   BMI 32.39 kg/m   Physical Exam  Constitutional: He is oriented to person, place, and time and well-developed, well-nourished, and in no distress.  HENT:  Head: Normocephalic and atraumatic.  Eyes: Pupils are equal, round, and reactive to light.  Neck: Normal range of motion. Neck supple.  Cardiovascular:  Normal rate, regular rhythm and normal heart sounds.   Pulmonary/Chest: Effort normal and breath sounds normal.  Musculoskeletal:  Straight leg raise and figure 4 positive on the right  Neurological: He is alert and oriented to person, place, and time.  Skin: Skin is warm and dry.  Psychiatric: Mood, memory, affect and judgment normal.    Assessment and Plan :   1. Low back pain, unspecified back pain laterality, with sciatica presence unspecified May benefit in the future from physical therapy referral. - DG Lumbar Spine Complete; Future - HYDROcodone-acetaminophen (NORCO/VICODIN) 5-325 MG tablet; Take 1 tablet by mouth every 6 (six) hours as needed for moderate pain.  Dispense: 30 tablet; Refill: 0 - DG HIPS BILAT WITH PELVIS 2V; Future - naproxen (NAPROSYN) 500 MG tablet; Take 1 tablet (500 mg total) by mouth 2 (two) times daily with a meal.  Dispense: 30 tablet; Refill: 0  2. Hip pain, right May need or the referral in the future. - DG Lumbar Spine Complete; Future - HYDROcodone-acetaminophen (NORCO/VICODIN) 5-325 MG tablet; Take 1 tablet by mouth every 6 (six) hours as needed for moderate pain.  Dispense: 30 tablet; Refill: 0 - DG HIPS BILAT WITH PELVIS 2V; Future - naproxen (NAPROSYN) 500 MG tablet; Take 1 tablet (500 mg total) by mouth 2 (two) times daily with a meal.  Dispense: 30 tablet; Refill: 0  I have done the exam and reviewed the above chart and it is accurate to the best of my knowledge.  Miguel Aschoff MD [facility] 10/08/2015 2:54 PM

## 2015-10-09 ENCOUNTER — Telehealth: Payer: Self-pay | Admitting: Family Medicine

## 2015-10-09 NOTE — Telephone Encounter (Signed)
Please send Naproxen locally--thx

## 2015-10-09 NOTE — Telephone Encounter (Signed)
Pt wants to know if you could "call in the pain medication to [facility] that you prescribed for him yesterday"  Mail order will take too long.  His call back is [phone].  He would like someone to call him back and let him know when it has been done.  Thanks Con Memos

## 2015-10-21 IMAGING — CR DG CHEST 2V
1 series · 2 of 2 positions shown · non-contrast
Comparison: 10/15/2008

CLINICAL DATA: Cough 1 month

EXAM:
CHEST  2 VIEW

[Series 1: dg chest 2 view · 0.14mm/px · 2 of 2 slices shown]
[im 1/2]
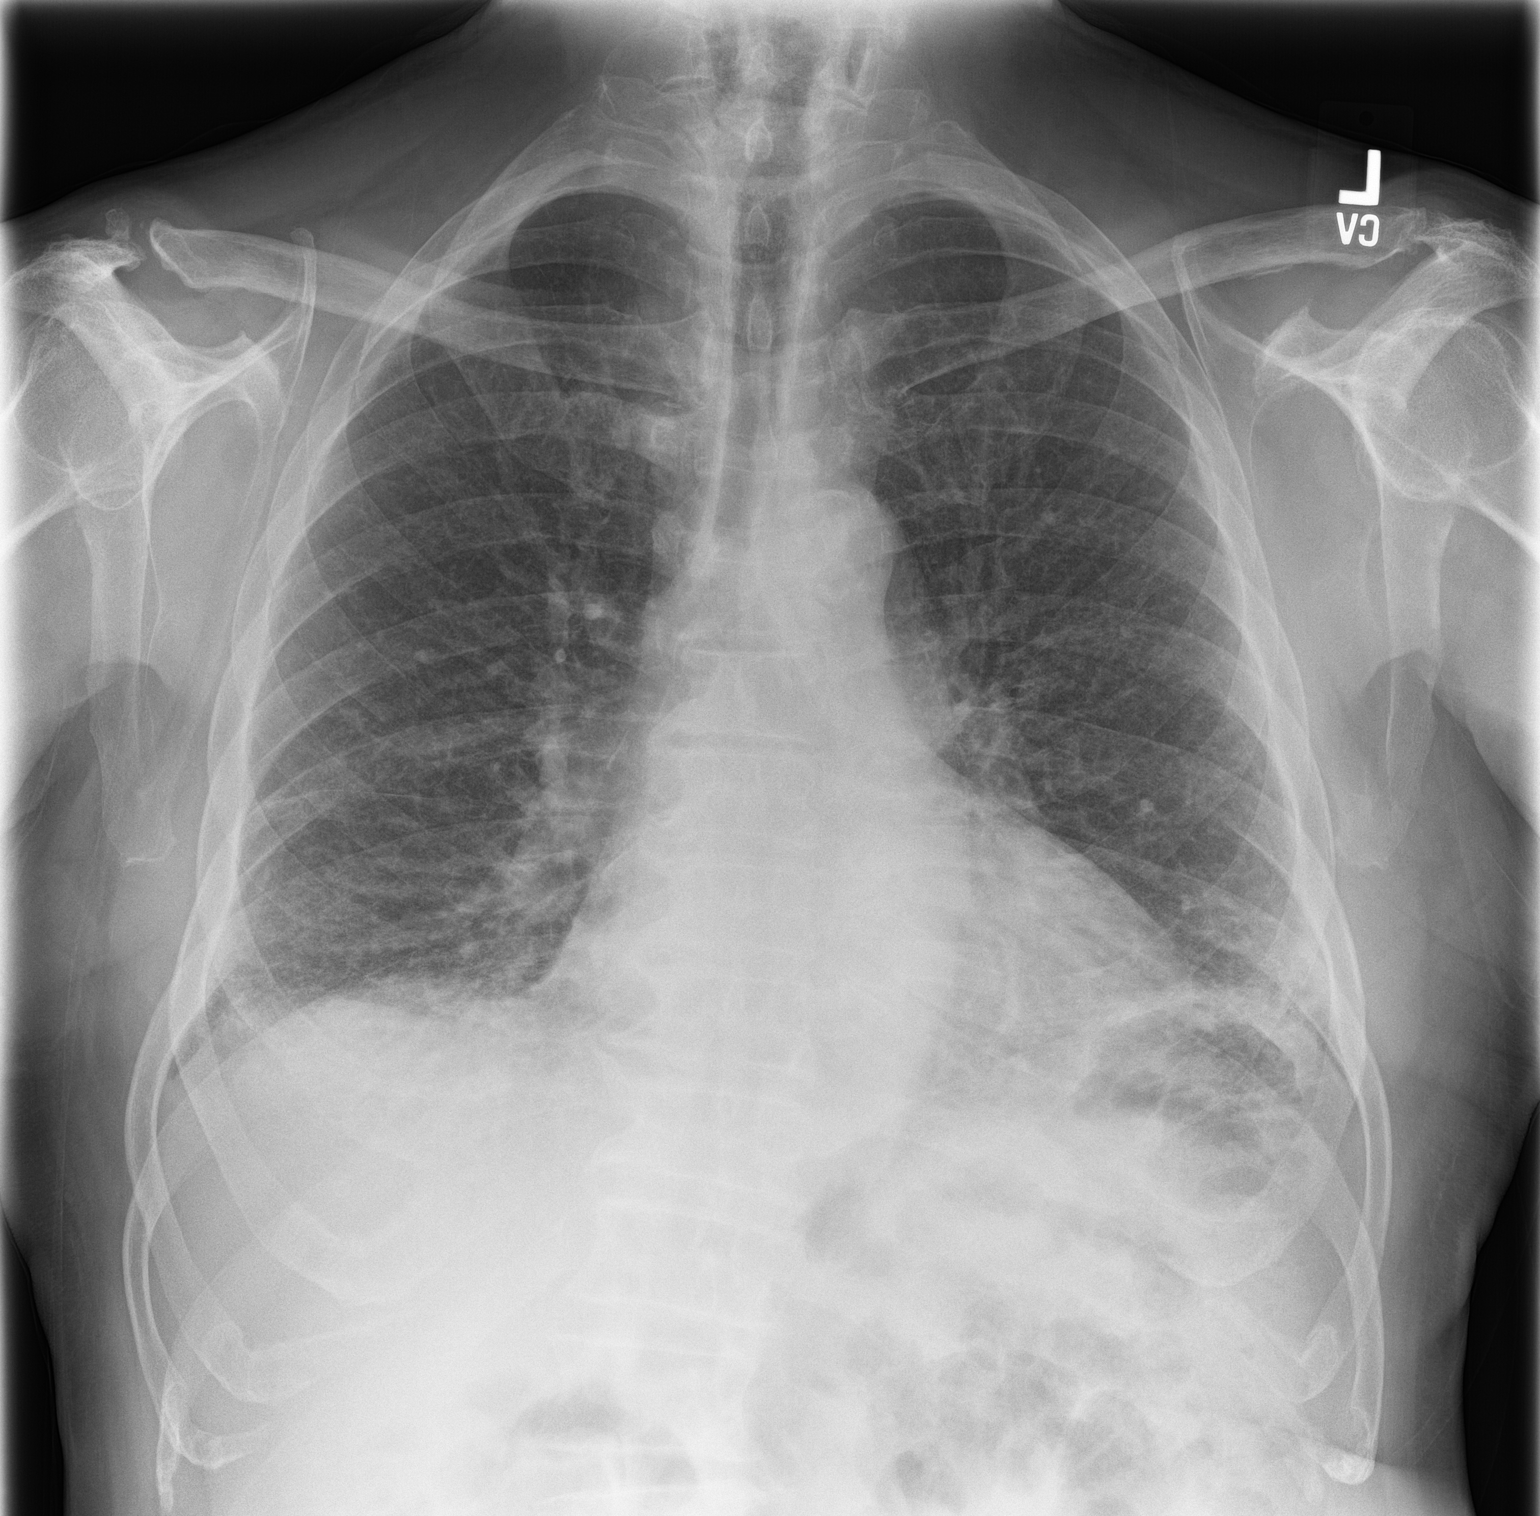
[im 2/2]
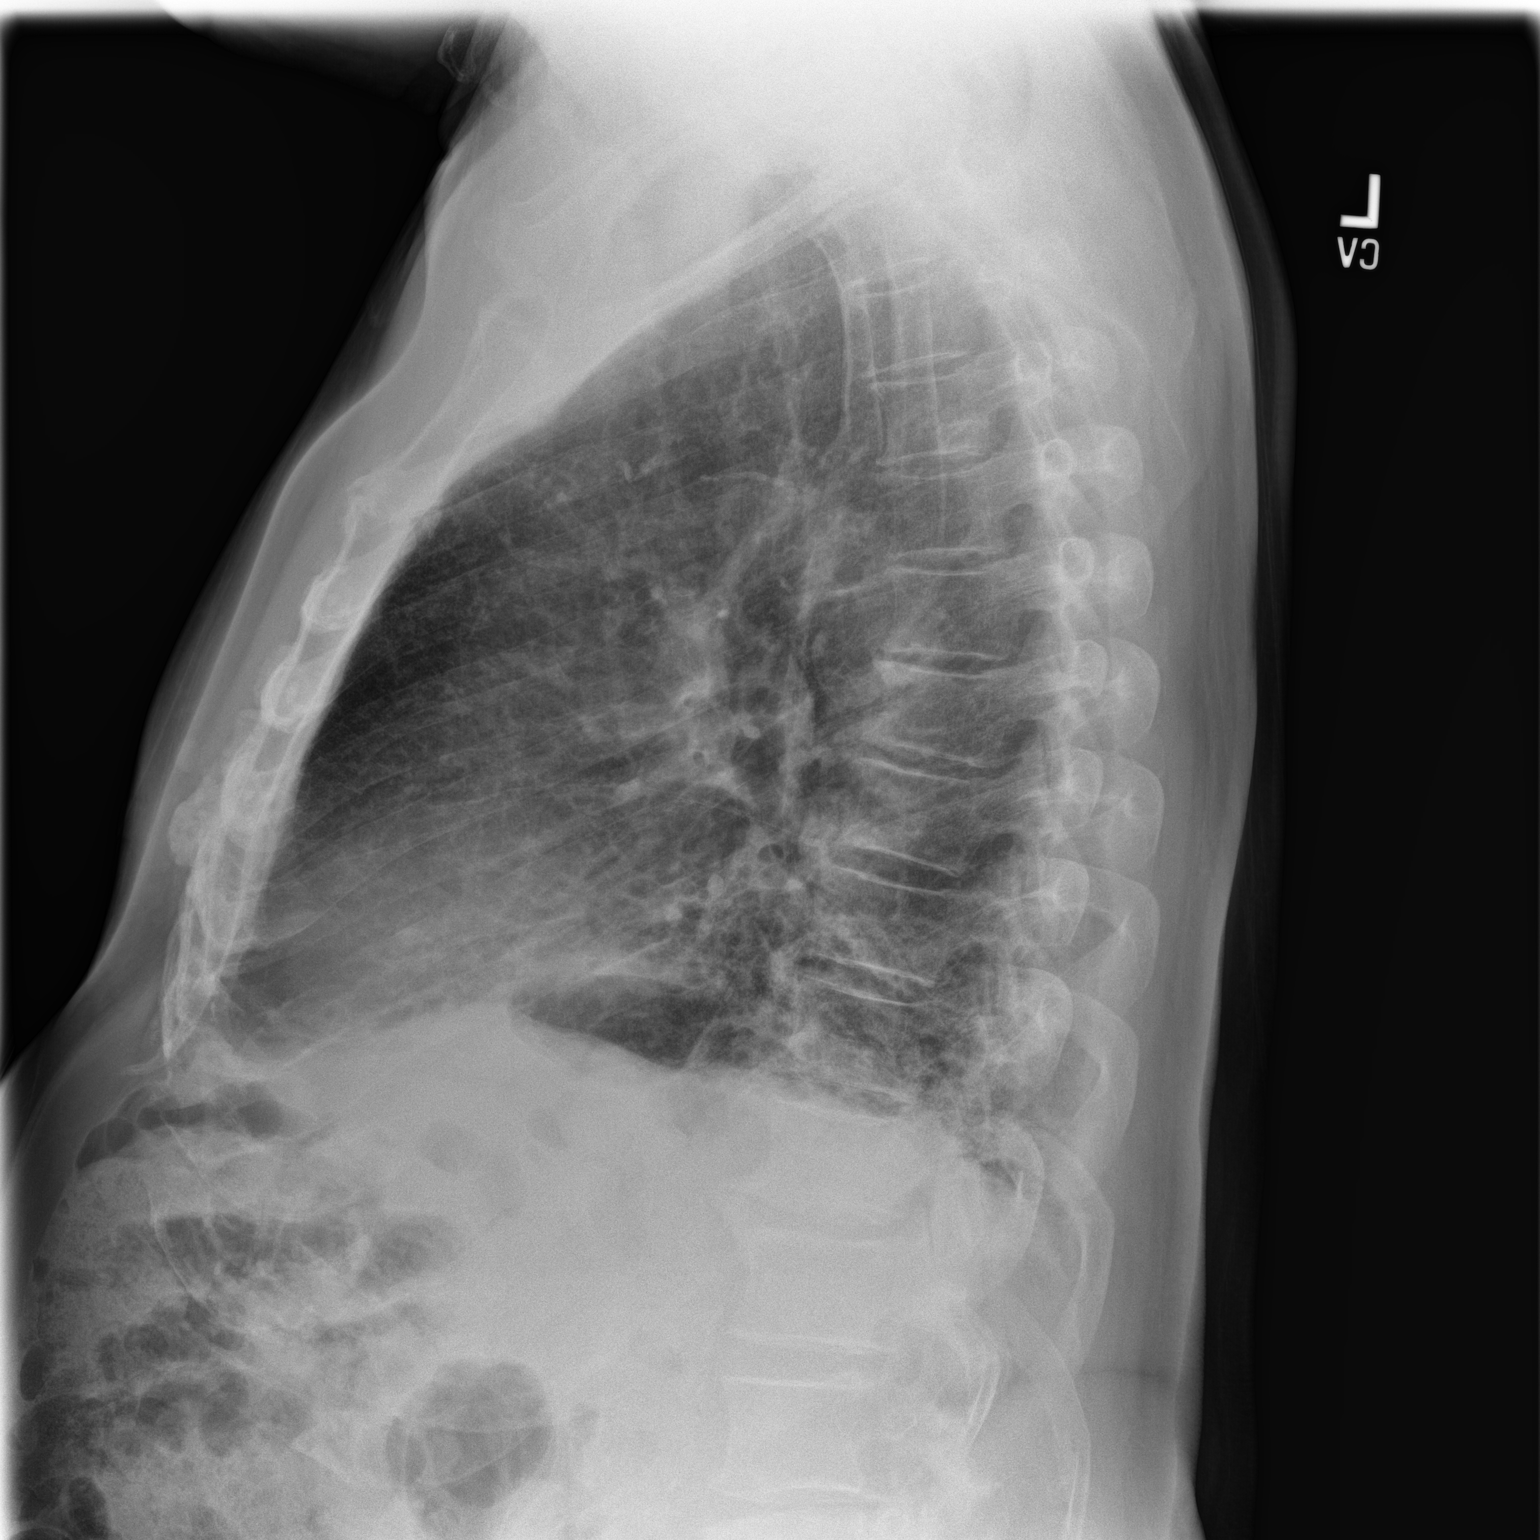

[2 of 2 positions shown; findings below may reference images not displayed]

FINDINGS: Patchy bibasilar infiltrates are present and have developed since
the prior study. Possible pneumonia. Negative for heart failure or
effusion.
IMPRESSION: Bibasilar airspace disease, possible pneumonia

## 2015-10-22 DIAGNOSIS — E119 Type 2 diabetes mellitus without complications: Secondary | ICD-10-CM | POA: Diagnosis not present

## 2015-10-22 DIAGNOSIS — B351 Tinea unguium: Secondary | ICD-10-CM | POA: Diagnosis not present

## 2015-10-30 ENCOUNTER — Ambulatory Visit (INDEPENDENT_AMBULATORY_CARE_PROVIDER_SITE_OTHER): Payer: Medicare Other | Admitting: Family Medicine

## 2015-10-30 ENCOUNTER — Ambulatory Visit
Admission: RE | Admit: 2015-10-30 | Discharge: 2015-10-30 | Disposition: A | Discharge status: Home / Self Care | Payer: Medicare Other | Source: Ambulatory Visit | Attending: Family Medicine | Admitting: Family Medicine

## 2015-10-30 ENCOUNTER — Telehealth: Payer: Self-pay

## 2015-10-30 ENCOUNTER — Encounter: Payer: Self-pay | Admitting: Family Medicine

## 2015-10-30 VITALS — BP 122/72 | HR 80 | Temp 98.6°F | Resp 16 | Wt 214.0 lb

## 2015-10-30 DIAGNOSIS — M25562 Pain in left knee: Secondary | ICD-10-CM | POA: Diagnosis not present

## 2015-10-30 DIAGNOSIS — M25551 Pain in right hip: Secondary | ICD-10-CM | POA: Diagnosis not present

## 2015-10-30 DIAGNOSIS — M545 Low back pain: Secondary | ICD-10-CM

## 2015-10-30 MED ORDER — HYDROCODONE-ACETAMINOPHEN 5-325 MG PO TABS: 1.0000 | ORAL_TABLET | Freq: Four times a day (QID) | ORAL | 0 refills | Status: DC | PRN

## 2015-10-30 NOTE — Telephone Encounter (Signed)
Advised patient as below. Please schedule patient for a color flow doppler. Thanks!

## 2015-10-30 NOTE — Telephone Encounter (Signed)
-----   Message from Jerrol Banana., MD sent at 10/30/2015  2:01 PM EDT ----- Significant arthritis on x-ray. Due to unusual finding on x-ray obtained color-flow.we'll duplex Doppler of left lower extremity to rule out popliteal arterial aneurysm

## 2015-10-30 NOTE — Progress Notes (Signed)
Patient: Michael Murphy Male    DOB: 06/05/1927   82 y.o.   MRN: [id number] Visit Date: 10/30/2015  Today's Provider: [name], MD   Chief Complaint  Patient presents with  . Back Pain    3 week follow up.    Subjective:    HPI Patient comes in today to follow up on back pain. Patient was seen in the office on 10/08/2015 and was treated with Norco which he reports helped a lot. Patient reports that he still has pain in his left knee and in his right hip. Patient denies any numbness or tingling. He is able to bear weight, but he just has a dull achy sensation in his joints.     Allergies  Allergen Reactions  . Accupril  [Quinapril Hcl] Swelling    throat swells  . Quinapril Swelling   Current Meds  Medication Sig  . alendronate (FOSAMAX) 70 MG tablet Take 1 tablet (70 mg total) by mouth once a week. Take with a full glass of water on an empty stomach.  Marland Kitchen amLODipine (NORVASC) 10 MG tablet Take 1 tablet by mouth daily.  . budesonide (PULMICORT) 180 MCG/ACT inhaler Inhale 2 puffs into the lungs 2 (two) times daily.  Marland Kitchen docusate sodium (COLACE) 100 MG capsule Take 2 capsules by mouth daily.  Marland Kitchen FLAXSEED, LINSEED, PO Take 2 capsules by mouth daily.  . Garlic Oil 123XX123 MG CAPS Take 1 capsule by mouth daily.  Marland Kitchen HYDROcodone-acetaminophen (NORCO/VICODIN) 5-325 MG tablet Take 1 tablet by mouth every 6 (six) hours as needed for moderate pain.  . metFORMIN (GLUCOPHAGE) 500 MG tablet Take 1 tablet by mouth daily.  . naproxen (NAPROSYN) 500 MG tablet Take 1 tablet (500 mg total) by mouth 2 (two) times daily with a meal.  . Omega-3 Fatty Acids (FISH OIL) 1000 MG CAPS Take 1 capsule by mouth daily.  . simvastatin (ZOCOR) 20 MG tablet Take 1 tablet by mouth at bedtime.    Review of Systems  Respiratory: Negative.   Cardiovascular: Negative.   Gastrointestinal: Negative.   Endocrine: Negative.   Genitourinary: Negative.   Musculoskeletal: Positive for arthralgias, back pain, joint  swelling and myalgias. Negative for gait problem, neck pain and neck stiffness.  Skin: Negative.   Neurological: Positive for tremors, syncope and weakness. Negative for dizziness, seizures, facial asymmetry, speech difficulty, light-headedness, numbness and headaches.    Social History  Substance Use Topics  . Smoking status: Former Smoker    Packs/day: 0.50    Years: 18.00    Types: Cigarettes    Quit date: 03/09/1991  . Smokeless tobacco: Not on file  . Alcohol use 1.2 oz/week    2 Standard drinks or equivalent per week     Comment: beers   Objective:   BP 122/72 (BP Location: Right Arm, Patient Position: Sitting, Cuff Size: Normal)   Pulse 80   Temp 98.6 F (37 C)   Resp 16   Wt 214 lb (97.1 kg)   BMI 32.54 kg/m   Physical Exam  Constitutional: He appears well-developed and well-nourished.  HENT:  Head: Normocephalic and atraumatic.  Eyes: Conjunctivae are normal.  Neck: No thyromegaly present.  Cardiovascular: Normal rate, regular rhythm and normal heart sounds.   Pulmonary/Chest: Effort normal and breath sounds normal.  Abdominal: Soft.  Musculoskeletal: He exhibits no edema, tenderness or deformity.  Lymphadenopathy:    He has no cervical adenopathy.  Skin: Skin is warm and dry.  Psychiatric:  He has a normal mood and affect. His behavior is normal. Judgment and thought content normal.        Assessment & Plan:     1. Left knee pain My guess is this is bone on bone arthritis. May need orthopedic referral. The other pain the patient had is  much better. - DG Knee Complete 4 Views Left; Future  2. Low back pain, unspecified back pain laterality, with sciatica presence unspecified  - HYDROcodone-acetaminophen (NORCO/VICODIN) 5-325 MG tablet; Take 1 tablet by mouth every 6 (six) hours as needed for moderate pain.  Dispense: 55 tablet; Refill: 0  3. Hip pain, right  - HYDROcodone-acetaminophen (NORCO/VICODIN) 5-325 MG tablet; Take 1 tablet by mouth every 6  (six) hours as needed for moderate pain.  Dispense: 55 tablet; Refill: 0      I have done the exam and reviewed the above chart and it is accurate to the best of my knowledge. I have done the exam and reviewed the above chart and it is accurate to the best of my knowledge.   Horrace Hanak Cranford Mon, MD  [facility]

## 2015-11-01 NOTE — Telephone Encounter (Signed)
Can you find out for me how to order this? I can not find any tests with this info never ordered it before-aa please and thank you

## 2015-11-01 NOTE — Telephone Encounter (Signed)
Please add order to EPIC °

## 2015-11-04 NOTE — Telephone Encounter (Signed)
Order for Left lower arterial duplex faxed to [facility].Their office will contact pt

## 2015-11-05 NOTE — Telephone Encounter (Signed)
Appointment scheduled at [facility] 11/06/15 at 1:00

## 2015-11-06 ENCOUNTER — Other Ambulatory Visit: Payer: Self-pay

## 2015-11-06 DIAGNOSIS — M79609 Pain in unspecified limb: Secondary | ICD-10-CM | POA: Diagnosis not present

## 2015-11-12 ENCOUNTER — Telehealth: Payer: Self-pay | Admitting: Family Medicine

## 2015-11-12 ENCOUNTER — Ambulatory Visit (INDEPENDENT_AMBULATORY_CARE_PROVIDER_SITE_OTHER): Payer: Medicare Other | Admitting: Family Medicine

## 2015-11-12 ENCOUNTER — Encounter: Payer: Self-pay | Admitting: Family Medicine

## 2015-11-12 VITALS — BP 142/70 | HR 72 | Temp 98.1°F | Resp 16 | Wt 216.0 lb

## 2015-11-12 DIAGNOSIS — M1990 Unspecified osteoarthritis, unspecified site: Secondary | ICD-10-CM

## 2015-11-12 DIAGNOSIS — I10 Essential (primary) hypertension: Secondary | ICD-10-CM

## 2015-11-12 MED ORDER — MELOXICAM 7.5 MG PO TABS: 7.5000 mg | ORAL_TABLET | Freq: Two times a day (BID) | ORAL | 0 refills | Status: DC | PRN

## 2015-11-12 NOTE — Telephone Encounter (Signed)
Pt calling for results on a knee [place].  His cll back is [phone]  Thanks Con Memos

## 2015-11-12 NOTE — Telephone Encounter (Signed)
Please review-aa 

## 2015-11-12 NOTE — Patient Instructions (Signed)
Start heating pad along with Meloxicam.

## 2015-11-12 NOTE — Progress Notes (Signed)
Patient: Michael Murphy Male    DOB: [birth year]   82 y.o.   MRN: [id number] Visit Date: 11/12/2015  Today's Provider: [name], MD   Chief Complaint  Patient presents with  . Hip Pain  . Back Pain   Subjective:    HPI Patient comes in today c/o right hip pain and lower back pain that has been worsening. Patient reports that his pain is worse in the mornings. Stiffness improves as the morning /day goes on.   Stiffness is worse early in the morning and improves as the day goes on Allergies  Allergen Reactions  . Accupril  [Quinapril Hcl] Swelling    throat swells  . Quinapril Swelling     Current Outpatient Prescriptions:  .  alendronate (FOSAMAX) 70 MG tablet, Take 1 tablet (70 mg total) by mouth once a week. Take with a full glass of water on an empty stomach., Disp: 12 tablet, Rfl: 3 .  amLODipine (NORVASC) 10 MG tablet, Take 1 tablet by mouth daily., Disp: , Rfl:  .  budesonide (PULMICORT) 180 MCG/ACT inhaler, Inhale 2 puffs into the lungs 2 (two) times daily., Disp: 1 Inhaler, Rfl: 0 .  docusate sodium (COLACE) 100 MG capsule, Take 2 capsules by mouth daily., Disp: , Rfl:  .  FLAXSEED, LINSEED, PO, Take 2 capsules by mouth daily., Disp: , Rfl:  .  Garlic Oil 123XX123 MG CAPS, Take 1 capsule by mouth daily., Disp: , Rfl:  .  HYDROcodone-acetaminophen (NORCO/VICODIN) 5-325 MG tablet, Take 1 tablet by mouth every 6 (six) hours as needed for moderate pain., Disp: 55 tablet, Rfl: 0 .  metFORMIN (GLUCOPHAGE) 500 MG tablet, Take 1 tablet by mouth daily., Disp: , Rfl:  .  naproxen (NAPROSYN) 500 MG tablet, Take 1 tablet (500 mg total) by mouth 2 (two) times daily with a meal., Disp: 30 tablet, Rfl: 0 .  Omega-3 Fatty Acids (FISH OIL) 1000 MG CAPS, Take 1 capsule by mouth daily., Disp: , Rfl:  .  simvastatin (ZOCOR) 20 MG tablet, Take 1 tablet by mouth at bedtime., Disp: , Rfl:   Review of Systems  Constitutional: Negative.   Respiratory: Negative.   Cardiovascular:  Negative.   Endocrine: Negative.   Musculoskeletal: Positive for arthralgias and back pain.  Allergic/Immunologic: Negative.   Neurological: Negative.   Psychiatric/Behavioral: Negative.     Social History  Substance Use Topics  . Smoking status: Former Smoker    Packs/day: 0.50    Years: 18.00    Types: Cigarettes    Quit date: 03/09/1991  . Smokeless tobacco: Not on file  . Alcohol use 1.2 oz/week    2 Standard drinks or equivalent per week     Comment: beers   Objective:   BP (!) 142/70 (BP Location: Right Arm, Patient Position: Sitting, Cuff Size: Normal)   Pulse 72   Temp 98.1 F (36.7 C)   Resp 16   Wt 216 lb (98 kg)   BMI 32.84 kg/m   Physical Exam  Constitutional: He is oriented to person, place, and time. He appears well-developed and well-nourished.  HENT:  Head: Normocephalic and atraumatic.  Eyes: Conjunctivae are normal. No scleral icterus.  Neck: Neck supple. No thyromegaly present.  Cardiovascular: Normal rate, regular rhythm and normal heart sounds.   Pulmonary/Chest: Effort normal and breath sounds normal.  Abdominal: Soft.  Lymphadenopathy:    He has no cervical adenopathy.  Neurological: He is alert and oriented to person, place, and  time.  Skin: Skin is warm and dry.  Psychiatric: He has a normal mood and affect. His behavior is normal. Judgment and thought content normal.        Assessment & Plan:      OA Try prn Meloxicam --no more than half of days--it helps with significant pain..   May need /benefit from ortho referral. HTN I have done the exam and reviewed the above chart and it is accurate to the best of my knowledge.   Breion Novacek Cranford Mon, MD  [facility]

## 2015-11-13 NOTE — Telephone Encounter (Signed)
Addressed already-aa

## 2015-12-03 ENCOUNTER — Ambulatory Visit (INDEPENDENT_AMBULATORY_CARE_PROVIDER_SITE_OTHER): Payer: Medicare Other | Admitting: Family Medicine

## 2015-12-03 VITALS — BP 148/56 | HR 88 | Resp 16 | Wt 216.0 lb

## 2015-12-03 DIAGNOSIS — M1990 Unspecified osteoarthritis, unspecified site: Secondary | ICD-10-CM | POA: Diagnosis not present

## 2015-12-03 DIAGNOSIS — Z23 Encounter for immunization: Secondary | ICD-10-CM

## 2015-12-03 MED ORDER — MELOXICAM 7.5 MG PO TABS: 7.5000 mg | ORAL_TABLET | Freq: Two times a day (BID) | ORAL | 11 refills | Status: DC | PRN

## 2015-12-03 NOTE — Progress Notes (Signed)
Michael Murphy  MRN: [id number] DOB: 03-24-1927  Subjective:  HPI   The patient is an 82 year old male who presents for follow up of his arthritis.  The patient was seen 3 weeks ago and had back and knee x-rays.  The back x-ray revealed significant arthritis for which he has been using Meloxicam and getting good results.   The patient states that he did not get the results of the knee x-ray, however, they were included with the back x-ray results.  He did not have a doppler done on the knee as recommended.  Patient Active Problem List   Diagnosis Date Noted  . Abnormal laboratory test 07/12/2014  . Carpal tunnel syndrome 07/12/2014  . Epididymitis 07/12/2014  . Post-operative state 07/12/2014  . Adiposity 07/12/2014  . Neurosis, posttraumatic 07/12/2014  . CA of prostate ([place]) 07/12/2014  . Excessive urination at night 01/23/2013  . ED (erectile dysfunction) of organic origin 01/23/2013  . Malignant neoplasm of prostate ([place]) 12/07/2011  . Diabetic neuropathy ([place]) 02/12/2009  . Allergic rhinitis 02/11/2007  . Benign essential HTN 02/11/2007  . Arthritis, degenerative 02/11/2007  . OP (osteoporosis) 02/11/2007  . Hypercholesterolemia without hypertriglyceridemia 02/11/2007  . Current tobacco use 02/11/2007    No past medical history on file.  Social History   Social History  . Marital status: Widowed    Spouse name: N/A  . Number of children: N/A  . Years of education: N/A   Occupational History  . Not on file.   Social History Main Topics  . Smoking status: Former Smoker    Packs/day: 0.50    Years: 18.00    Types: Cigarettes    Quit date: 03/09/1991  . Smokeless tobacco: Not on file  . Alcohol use 1.2 oz/week    2 Standard drinks or equivalent per week     Comment: beers  . Drug use: No  . Sexual activity: Not on file   Other Topics Concern  . Not on file   Social History Narrative  . No narrative on file    Outpatient Encounter Prescriptions as of  12/03/2015  Medication Sig Note  . alendronate (FOSAMAX) 70 MG tablet Take 1 tablet (70 mg total) by mouth once a week. Take with a full glass of water on an empty stomach.   Marland Kitchen amLODipine (NORVASC) 10 MG tablet Take 1 tablet by mouth daily. 07/12/2014: DX: 401.1 Received from: [place]:   . budesonide (PULMICORT) 180 MCG/ACT inhaler Inhale 2 puffs into the lungs 2 (two) times daily.   Marland Kitchen FLAXSEED, LINSEED, PO Take 2 capsules by mouth daily. 07/12/2014: Received from: [place]:   . Garlic Oil 123XX123 MG CAPS Take 1 capsule by mouth daily. 07/12/2014: Received from: [place]: GARLIC OIL, 1000MG  (Oral Capsule)  1 Every Day for 0 days  Quantity: 0.00;  Refills: 0   Ordered :25-Feb-2010  Doy Hutching ;  Started 14-Apr-2007 Active  . meloxicam (MOBIC) 7.5 MG tablet Take 1 tablet (7.5 mg total) by mouth 2 (two) times daily as needed for pain.   . metFORMIN (GLUCOPHAGE) 500 MG tablet Take 1 tablet by mouth daily. 07/12/2014: Received from: [place]:   . Omega-3 Fatty Acids (FISH OIL) 1000 MG CAPS Take 1 capsule by mouth daily. 07/12/2014: Received from: [place]: FISH OIL CONCENTRATE, 1000MG  (Oral Capsule)  1 Three Times Daily for 0 days  Quantity: 0.00;  Refills:  0   Ordered :25-Feb-2010  Doy Hutching ;  Started 14-Apr-2007 Active  . simvastatin (ZOCOR) 20 MG tablet Take 1 tablet by mouth at bedtime. 07/12/2014: Received from: [place]:   . [DISCONTINUED] docusate sodium (COLACE) 100 MG capsule Take 2 capsules by mouth daily. 07/12/2014: Received from: [place]:   . [DISCONTINUED] HYDROcodone-acetaminophen (NORCO/VICODIN) 5-325 MG tablet Take 1 tablet by mouth every 6 (six) hours as needed for moderate pain.   . [DISCONTINUED] naproxen (NAPROSYN) 500 MG tablet Take 1 tablet (500 mg total)  by mouth 2 (two) times daily with a meal.    No facility-administered encounter medications on file as of 12/03/2015.     Allergies  Allergen Reactions  . Accupril  [Quinapril Hcl] Swelling    throat swells  . Quinapril Swelling    Review of Systems  Constitutional: Negative for fever and malaise/fatigue.  Respiratory: Negative for cough, shortness of breath and wheezing.   Cardiovascular: Negative for chest pain, palpitations, orthopnea, claudication, leg swelling and PND.  Musculoskeletal: Positive for back pain (Not as bad since being on Meloxicam). Negative for myalgias. Joint pain: Not as bad since being on Meloxicam.  Neurological: Negative for weakness and headaches.   Objective:  BP (!) 148/56   Pulse 88   Resp 16   Wt 216 lb (98 kg)   BMI 32.84 kg/m   Physical Exam  Constitutional: He is well-developed, well-nourished, and in no distress.  HENT:  Head: Normocephalic.  Eyes: Pupils are equal, round, and reactive to light.  Neck: Normal range of motion.  Cardiovascular: Normal rate, regular rhythm and normal heart sounds.   Pulmonary/Chest: Effort normal and breath sounds normal.    Assessment and Plan :  1. Need for influenza vaccination  - Flu vaccine HIGH DOSE PF (Fluzone High dose)  2. Arthritis Advised him to take. Advised of the risk of peptic ulcer disease and kidney damage. - meloxicam (MOBIC) 7.5 MG tablet; Take 1 tablet (7.5 mg total) by mouth 2 (two) times daily as needed for pain.  Dispense: 60 tablet; Refill: 11 I have done the exam and reviewed the chart and it is accurate to the best of my knowledge. Miguel Aschoff M.D. [facility]     HPI, Exam and A&P Transcribed under the direction and in the presence of [name]., MD. Electronically Signed: Althea Charon, [place]

## 2015-12-05 ENCOUNTER — Encounter: Payer: Self-pay | Admitting: Family Medicine

## 2016-01-22 DIAGNOSIS — B351 Tinea unguium: Secondary | ICD-10-CM | POA: Diagnosis not present

## 2016-01-22 DIAGNOSIS — E119 Type 2 diabetes mellitus without complications: Secondary | ICD-10-CM | POA: Diagnosis not present

## 2016-01-28 DIAGNOSIS — Z8546 Personal history of malignant neoplasm of prostate: Secondary | ICD-10-CM | POA: Diagnosis not present

## 2016-02-04 ENCOUNTER — Ambulatory Visit (INDEPENDENT_AMBULATORY_CARE_PROVIDER_SITE_OTHER): Payer: Medicare Other | Admitting: Family Medicine

## 2016-02-04 VITALS — BP 150/74 | HR 94 | Temp 98.1°F | Resp 18 | Wt 215.0 lb

## 2016-02-04 DIAGNOSIS — J40 Bronchitis, not specified as acute or chronic: Secondary | ICD-10-CM

## 2016-02-04 DIAGNOSIS — M47817 Spondylosis without myelopathy or radiculopathy, lumbosacral region: Secondary | ICD-10-CM

## 2016-02-04 NOTE — Patient Instructions (Signed)
Generic over the counter Robitussin DM

## 2016-02-04 NOTE — Progress Notes (Addendum)
Michael Murphy  MRN: [id number] DOB: Sep 14, 1927  Subjective:  HPI   The patient is an 82 year old male who presents for follow up of back pain/arthritis.  He was last seen on 12/03/15 and was treated with Meloxicam.  He states he is ot longer needing to take the Meloxicam and has not had any more issues.    The patient states he has been having cold symptoms since coming back from the beach 4 days ago and 3 days ago started having some fever, chills, cough, and chest congestion.  He has only been using cough drops to treat his symptoms.  Patient Active Problem List   Diagnosis Date Noted  . Abnormal laboratory test 07/12/2014  . Carpal tunnel syndrome 07/12/2014  . Epididymitis 07/12/2014  . Post-operative state 07/12/2014  . Adiposity 07/12/2014  . Neurosis, posttraumatic 07/12/2014  . CA of prostate ([place]) 07/12/2014  . Excessive urination at night 01/23/2013  . ED (erectile dysfunction) of organic origin 01/23/2013  . Malignant neoplasm of prostate ([place]) 12/07/2011  . Diabetic neuropathy ([place]) 02/12/2009  . Allergic rhinitis 02/11/2007  . Benign essential HTN 02/11/2007  . Arthritis, degenerative 02/11/2007  . OP (osteoporosis) 02/11/2007  . Hypercholesterolemia without hypertriglyceridemia 02/11/2007  . Current tobacco use 02/11/2007    No past medical history on file.  Social History   Social History  . Marital status: Widowed    Spouse name: N/A  . Number of children: N/A  . Years of education: N/A   Occupational History  . Not on file.   Social History Main Topics  . Smoking status: Former Smoker    Packs/day: 0.50    Years: 18.00    Types: Cigarettes    Quit date: 03/09/1991  . Smokeless tobacco: Not on file  . Alcohol use 1.2 oz/week    2 Standard drinks or equivalent per week     Comment: beers  . Drug use: No  . Sexual activity: Not on file   Other Topics Concern  . Not on file   Social History Narrative  . No narrative on file    Outpatient  Encounter Prescriptions as of 02/04/2016  Medication Sig Note  . alendronate (FOSAMAX) 70 MG tablet Take 1 tablet (70 mg total) by mouth once a week. Take with a full glass of water on an empty stomach.   Marland Kitchen amLODipine (NORVASC) 10 MG tablet Take 1 tablet by mouth daily. 07/12/2014: DX: 401.1 Received from: [place]:   . budesonide (PULMICORT) 180 MCG/ACT inhaler Inhale 2 puffs into the lungs 2 (two) times daily.   Marland Kitchen FLAXSEED, LINSEED, PO Take 2 capsules by mouth daily. 07/12/2014: Received from: [place]:   . Garlic Oil 123XX123 MG CAPS Take 1 capsule by mouth daily. 07/12/2014: Received from: [place]: GARLIC OIL, 1000MG  (Oral Capsule)  1 Every Day for 0 days  Quantity: 0.00;  Refills: 0   Ordered :25-Feb-2010  Doy Hutching ;  Started 14-Apr-2007 Active  . meloxicam (MOBIC) 7.5 MG tablet Take 1 tablet (7.5 mg total) by mouth 2 (two) times daily as needed for pain.   . metFORMIN (GLUCOPHAGE) 500 MG tablet Take 1 tablet by mouth daily. 07/12/2014: Received from: [place]:   . Omega-3 Fatty Acids (FISH OIL) 1000 MG CAPS Take 1 capsule by mouth daily. 07/12/2014: Received from: [place]: FISH OIL CONCENTRATE, 1000MG  (Oral Capsule)  1 Three Times Daily for  0 days  Quantity: 0.00;  Refills: 0   Ordered :25-Feb-2010  Doy Hutching ;  Started 14-Apr-2007 Active  . simvastatin (ZOCOR) 20 MG tablet Take 1 tablet by mouth at bedtime. 07/12/2014: Received from: [place]:    No facility-administered encounter medications on file as of 02/04/2016.     Allergies  Allergen Reactions  . Accupril  [Quinapril Hcl] Swelling    throat swells  . Quinapril Swelling    Review of Systems  Constitutional: Positive for chills and fever. Negative for malaise/fatigue.  HENT: Positive for sore throat. Negative for congestion, ear  discharge, ear pain, hearing loss, sinus pain and tinnitus.   Respiratory: Positive for cough, sputum production (greenish in color) and shortness of breath. Negative for wheezing.   Cardiovascular: Negative for chest pain, palpitations, orthopnea and leg swelling.  Neurological: Negative for weakness.  Psychiatric/Behavioral: Negative.     Objective:  BP (!) 150/74 (BP Location: Right Arm, Patient Position: Sitting, Cuff Size: Normal)   Pulse 94   Temp 98.1 F (36.7 C) (Oral)   Resp 18   Wt 215 lb (97.5 kg)   BMI 32.69 kg/m   Physical Exam  Constitutional: He is oriented to person, place, and time and well-developed, well-nourished, and in no distress.  HENT:  Head: Normocephalic and atraumatic.  Right Ear: External ear normal.  Left Ear: External ear normal.  Nose: Nose normal.  Eyes: Conjunctivae are normal. No scleral icterus.  Neck: No thyromegaly present.  Cardiovascular: Normal rate, regular rhythm and normal heart sounds.   Pulmonary/Chest: Effort normal and breath sounds normal.  Abdominal: Soft.  Neurological: He is alert and oriented to person, place, and time.  Skin: Skin is warm and dry.  Psychiatric: Mood, memory, affect and judgment normal.    Assessment and Plan :  Osteoarthritis URI/bronchitis Treat with fluids and expectorants for now. Patient states he had pneumonia back in 1956.Doxycycline if needed. Prostate cancer    HPI, Exam and A&P Transcribed under the direction and in the presence of [name]., MD. Electronically Signed: Althea Charon, RMA I have done the exam and reviewed the chart and it is accurate to the best of my knowledge. Development worker, community has been used and  any errors in dictation or transcription are unintentional. Miguel Aschoff M.D. [facility]

## 2016-03-14 ENCOUNTER — Emergency Department
Admission: EM | Admit: 2016-03-14 | Discharge: 2016-03-14 | Disposition: A | Discharge status: Home / Self Care | Payer: Medicare Other | Attending: Emergency Medicine | Admitting: Emergency Medicine

## 2016-03-14 ENCOUNTER — Emergency Department: Payer: Medicare Other

## 2016-03-14 ENCOUNTER — Encounter: Payer: Self-pay | Admitting: Emergency Medicine

## 2016-03-14 DIAGNOSIS — Z7984 Long term (current) use of oral hypoglycemic drugs: Secondary | ICD-10-CM | POA: Diagnosis not present

## 2016-03-14 DIAGNOSIS — J40 Bronchitis, not specified as acute or chronic: Secondary | ICD-10-CM | POA: Insufficient documentation

## 2016-03-14 DIAGNOSIS — Z79899 Other long term (current) drug therapy: Secondary | ICD-10-CM | POA: Insufficient documentation

## 2016-03-14 DIAGNOSIS — Z87891 Personal history of nicotine dependence: Secondary | ICD-10-CM | POA: Insufficient documentation

## 2016-03-14 DIAGNOSIS — E119 Type 2 diabetes mellitus without complications: Secondary | ICD-10-CM | POA: Insufficient documentation

## 2016-03-14 DIAGNOSIS — I10 Essential (primary) hypertension: Secondary | ICD-10-CM | POA: Diagnosis not present

## 2016-03-14 DIAGNOSIS — R05 Cough: Secondary | ICD-10-CM | POA: Diagnosis not present

## 2016-03-14 DIAGNOSIS — F17210 Nicotine dependence, cigarettes, uncomplicated: Secondary | ICD-10-CM | POA: Diagnosis not present

## 2016-03-14 HISTORY — DX: Essential (primary) hypertension: I10

## 2016-03-14 HISTORY — DX: Type 2 diabetes mellitus without complications: E11.9

## 2016-03-14 MED ORDER — BENZONATATE 100 MG PO CAPS: 100.0000 mg | ORAL_CAPSULE | Freq: Three times a day (TID) | ORAL | 0 refills | Status: DC | PRN

## 2016-03-14 MED ORDER — AZITHROMYCIN 250 MG PO TABS: ORAL_TABLET | ORAL | 0 refills | Status: AC

## 2016-03-14 MED ORDER — HYDROCOD POLST-CPM POLST ER 10-8 MG/5ML PO SUER: 5.0000 mL | Freq: Once | ORAL | Status: DC

## 2016-03-14 NOTE — ED Provider Notes (Signed)
[facility] Emergency Department Provider Note   ____________________________________________   First MD Initiated Contact with Patient 03/14/16 1423     (approximate)  I have reviewed the triage vital signs and the nursing notes.   HISTORY  Chief Complaint Cough    HPI Michael Murphy is a 83 y.o. male patient complaining of productive cough for 6 days. Patient denies any fever or chills associated complaint. Patient denies nausea vomiting diarrhea. Patient rates his pain as a 6/10. Patient describes pain as "achy". No palliative measures taken for this complaint.   Past Medical History:  Diagnosis Date  . Diabetes mellitus without complication ([place])   . Hypertension     Patient Active Problem List   Diagnosis Date Noted  . Abnormal laboratory test 07/12/2014  . Carpal tunnel syndrome 07/12/2014  . Epididymitis 07/12/2014  . Post-operative state 07/12/2014  . Adiposity 07/12/2014  . Neurosis, posttraumatic 07/12/2014  . CA of prostate ([place]) 07/12/2014  . Excessive urination at night 01/23/2013  . ED (erectile dysfunction) of organic origin 01/23/2013  . Malignant neoplasm of prostate ([place]) 12/07/2011  . Diabetic neuropathy ([place]) 02/12/2009  . Allergic rhinitis 02/11/2007  . Benign essential HTN 02/11/2007  . Arthritis, degenerative 02/11/2007  . OP (osteoporosis) 02/11/2007  . Hypercholesterolemia without hypertriglyceridemia 02/11/2007  . Current tobacco use 02/11/2007    Past Surgical History:  Procedure Laterality Date  . CATARACT EXTRACTION    . KNEE ARTHROSCOPY     left  . PROSTATECTOMY    . ROTATOR CUFF REPAIR      Prior to Admission medications   Medication Sig Start Date End Date Taking? Authorizing Provider  alendronate (FOSAMAX) 70 MG tablet Take 1 tablet (70 mg total) by mouth once a week. Take with a full glass of water on an empty stomach. 07/08/15   Richard Maceo Pro., MD  amLODipine (NORVASC) 10 MG tablet Take 1  tablet by mouth daily. 10/15/10   Historical Provider, MD  azithromycin (ZITHROMAX Z-PAK) 250 MG tablet Take 2 tablets (500 mg) on  Day 1,  followed by 1 tablet (250 mg) once daily on Days 2 through 5. 03/14/16 03/19/16  Sable Feil, PA-C  benzonatate (TESSALON PERLES) 100 MG capsule Take 1 capsule (100 mg total) by mouth 3 (three) times daily as needed for cough. 03/14/16   Sable Feil, PA-C  budesonide (PULMICORT) 180 MCG/ACT inhaler Inhale 2 puffs into the lungs 2 (two) times daily. 12/21/14   Vickki Muff Chrismon, PA  FLAXSEED, LINSEED, PO Take 2 capsules by mouth daily. 02/25/10   Historical Provider, MD  Garlic Oil 123XX123 MG CAPS Take 1 capsule by mouth daily. 04/14/07   Historical Provider, MD  meloxicam (MOBIC) 7.5 MG tablet Take 1 tablet (7.5 mg total) by mouth 2 (two) times daily as needed for pain. 12/03/15   Richard Maceo Pro., MD  metFORMIN (GLUCOPHAGE) 500 MG tablet Take 1 tablet by mouth daily.    Historical Provider, MD  Omega-3 Fatty Acids (FISH OIL) 1000 MG CAPS Take 1 capsule by mouth daily. 04/14/07   Historical Provider, MD  simvastatin (ZOCOR) 20 MG tablet Take 1 tablet by mouth at bedtime.    Historical Provider, MD    Allergies Accupril  [quinapril hcl] and Quinapril  Family History  Problem Relation Age of Onset  . Hypertension Mother   . Stroke Mother   . Diabetes Sister   . Hypertension Sister   . Diabetes Brother   . Diabetes Brother  Social History Social History  Substance Use Topics  . Smoking status: Former Smoker    Packs/day: 0.50    Years: 18.00    Types: Cigarettes    Quit date: 03/09/1991  . Smokeless tobacco: Not on file  . Alcohol use 1.2 oz/week    2 Standard drinks or equivalent per week     Comment: beers    Review of Systems Constitutional: No fever/chills Eyes: No visual changes. ENT: No sore throat. Cardiovascular: Denies chest pain. Respiratory: Denies shortness of breath. Productive cough Gastrointestinal: No abdominal pain.  No  nausea, no vomiting.  No diarrhea.  No constipation. Genitourinary: Negative for dysuria. Musculoskeletal: Negative for back pain. Skin: Negative for rash. Neurological: Negative for headaches, focal weakness or numbness. Endocrine:Diabetes and hypertension Hematological/Lymphatic: Allergic/Immunilogical: See medication list __________________________________________   PHYSICAL EXAM:  VITAL SIGNS: ED Triage Vitals  Enc Vitals Group     BP 03/14/16 1406 (!) 182/84     Pulse Rate 03/14/16 1406 95     Resp 03/14/16 1406 (!) 2     Temp 03/14/16 1406 98.2 F (36.8 C)     Temp Source 03/14/16 1406 Oral     SpO2 03/14/16 1406 96 %     Weight 03/14/16 1407 200 lb (90.7 kg)     Height 03/14/16 1407 5\' 8"  (1.727 m)     Head Circumference --      Peak Flow --      Pain Score 03/14/16 1408 6     Pain Loc --      Pain Edu? --      Excl. in Opp? --     Constitutional: Alert and oriented. Well appearing and in no acute distress. Eyes: Conjunctivae are normal. PERRL. EOMI. Head: Atraumatic. Nose: No congestion/rhinnorhea. Mouth/Throat: Mucous membranes are moist.  Oropharynx non-erythematous. Neck: No stridor.  No cervical spine tenderness to palpation. Hematological/Lymphatic/Immunilogical: No cervical lymphadenopathy. Cardiovascular: Normal rate, regular rhythm. Grossly normal heart sounds.  Good peripheral circulation.Elevated blood pressure Respiratory: Normal respiratory effort.  No retractions. Lungs CTAB. Gastrointestinal: Soft and nontender. No distention. No abdominal bruits. No CVA tenderness. Musculoskeletal: No lower extremity tenderness nor edema.  No joint effusions. Neurologic:  Normal speech and language. No gross focal neurologic deficits are appreciated. No gait instability. Skin:  Skin is warm, dry and intact. No rash noted. Psychiatric: Mood and affect are normal. Speech and behavior are normal.  ____________________________________________   LABS (all labs  ordered are listed, but only abnormal results are displayed)  Labs Reviewed - No data to display ____________________________________________  EKG   ____________________________________________  RADIOLOGY  No acute abnormalities on the chest x-ray. Persistent fibrotic changes  present in the base of the lungs. ____________________________________________   PROCEDURES  Procedure(s) performed: None  Procedures  Critical Care performed: No  ____________________________________________   INITIAL IMPRESSION / ASSESSMENT AND PLAN / ED COURSE  Pertinent labs & imaging results that were available during my care of the patient were reviewed by me and considered in my medical decision making (see chart for details).  Bronchitis. Patient given discharge Instructions. Patient given a prescription for Zithromax and Tessalon Perles. Patient hasn't follow-up family doctor this condition persists.  Clinical Course      ____________________________________________   FINAL CLINICAL IMPRESSION(S) / ED DIAGNOSES  Final diagnoses:  Bronchitis      NEW MEDICATIONS STARTED DURING THIS VISIT:  New Prescriptions   AZITHROMYCIN (ZITHROMAX Z-PAK) 250 MG TABLET    Take 2 tablets (500 mg) on  Day  1,  followed by 1 tablet (250 mg) once daily on Days 2 through 5.   BENZONATATE (TESSALON PERLES) 100 MG CAPSULE    Take 1 capsule (100 mg total) by mouth 3 (three) times daily as needed for cough.     Note:  This document was prepared using Dragon voice recognition software and may include unintentional dictation errors.    Sable Feil, PA-C 03/14/16 [place], MD 03/16/16 1105

## 2016-03-14 NOTE — ED Triage Notes (Signed)
Cough x 6 days.

## 2016-04-29 DIAGNOSIS — E119 Type 2 diabetes mellitus without complications: Secondary | ICD-10-CM | POA: Diagnosis not present

## 2016-04-29 DIAGNOSIS — B351 Tinea unguium: Secondary | ICD-10-CM | POA: Diagnosis not present

## 2016-06-09 ENCOUNTER — Other Ambulatory Visit: Payer: Self-pay | Admitting: Family Medicine

## 2016-07-13 ENCOUNTER — Ambulatory Visit (INDEPENDENT_AMBULATORY_CARE_PROVIDER_SITE_OTHER): Payer: Medicare Other | Admitting: Family Medicine

## 2016-07-13 VITALS — BP 138/76 | HR 76 | Temp 98.2°F | Resp 16 | Wt 212.0 lb

## 2016-07-13 DIAGNOSIS — E119 Type 2 diabetes mellitus without complications: Secondary | ICD-10-CM | POA: Diagnosis not present

## 2016-07-13 DIAGNOSIS — I10 Essential (primary) hypertension: Secondary | ICD-10-CM

## 2016-07-13 DIAGNOSIS — E7800 Pure hypercholesterolemia, unspecified: Secondary | ICD-10-CM | POA: Diagnosis not present

## 2016-07-13 DIAGNOSIS — S86812A Strain of other muscle(s) and tendon(s) at lower leg level, left leg, initial encounter: Secondary | ICD-10-CM | POA: Diagnosis not present

## 2016-07-13 NOTE — Patient Instructions (Signed)
Can take Acetaminophen-Tylenol as needed for pain-2 tablets daily. Let us know if symptoms get worse.

## 2016-07-13 NOTE — Progress Notes (Signed)
Michael Murphy  MRN: [id number] DOB: [birth year]  Subjective:  HPI  Patient is here to evaluate left calf. Yesterday 07/12/16 he was getting ready for church and then stepped outside and all of a sudden developed severe pain-describes it as a sharp and a dull pain in the left calf. Pain does not radiate to other areas. Pain is better today. Does not recall any injury/trauma. No swelling, redness or warmth to the touch have been noted. Patient Active Problem List   Diagnosis Date Noted  . Abnormal laboratory test 07/12/2014  . Carpal tunnel syndrome 07/12/2014  . Epididymitis 07/12/2014  . Post-operative state 07/12/2014  . Adiposity 07/12/2014  . Neurosis, posttraumatic 07/12/2014  . CA of prostate ([place]) 07/12/2014  . Excessive urination at night 01/23/2013  . ED (erectile dysfunction) of organic origin 01/23/2013  . Malignant neoplasm of prostate ([place]) 12/07/2011  . Diabetic neuropathy ([place]) 02/12/2009  . Allergic rhinitis 02/11/2007  . Benign essential HTN 02/11/2007  . Arthritis, degenerative 02/11/2007  . OP (osteoporosis) 02/11/2007  . Hypercholesterolemia without hypertriglyceridemia 02/11/2007  . Current tobacco use 02/11/2007    Past Medical History:  Diagnosis Date  . Diabetes mellitus without complication ([place])   . Hypertension     Social History   Social History  . Marital status: Widowed    Spouse name: N/A  . Number of children: N/A  . Years of education: N/A   Occupational History  . Not on file.   Social History Main Topics  . Smoking status: Former Smoker    Packs/day: 0.50    Years: 18.00    Types: Cigarettes    Quit date: 03/09/1991  . Smokeless tobacco: Not on file  . Alcohol use 1.2 oz/week    2 Standard drinks or equivalent per week     Comment: beers  . Drug use: No  . Sexual activity: Not on file   Other Topics Concern  . Not on file   Social History Narrative  . No narrative on file    Outpatient Encounter Prescriptions as of  07/13/2016  Medication Sig Note  . alendronate (FOSAMAX) 70 MG tablet TAKE 1 TABLET ONCE EACH WEEK. TAKE WITH A FULL GLASS OF WATER ON AN EMPTY STOMACH   . amLODipine (NORVASC) 10 MG tablet Take 1 tablet by mouth daily. 07/12/2014: DX: 401.1 Received from: [place]:   . budesonide (PULMICORT) 180 MCG/ACT inhaler Inhale 2 puffs into the lungs 2 (two) times daily.   Marland Kitchen FLAXSEED, LINSEED, PO Take 2 capsules by mouth daily. 07/12/2014: Received from: [place]:   . Garlic Oil 7829 MG CAPS Take 1 capsule by mouth daily. 07/12/2014: Received from: [place]: GARLIC OIL, 1000MG  (Oral Capsule)  1 Every Day for 0 days  Quantity: 0.00;  Refills: 0   Ordered :25-Feb-2010  Doy Hutching ;  Started 14-Apr-2007 Active  . meloxicam (MOBIC) 7.5 MG tablet Take 1 tablet (7.5 mg total) by mouth 2 (two) times daily as needed for pain.   . metFORMIN (GLUCOPHAGE) 500 MG tablet Take 1 tablet by mouth daily. 07/12/2014: Received from: [place]:   . Omega-3 Fatty Acids (FISH OIL) 1000 MG CAPS Take 1 capsule by mouth daily. 07/12/2014: Received from: [place]: FISH OIL CONCENTRATE, 1000MG  (Oral Capsule)  1 Three Times Daily for 0 days  Quantity: 0.00;  Refills: 0   Ordered :25-Feb-2010  Doy Hutching ;  Started 14-Apr-2007 Active  .  simvastatin (ZOCOR) 20 MG tablet Take 1 tablet by mouth at bedtime. 07/12/2014: Received from: [place]:   . [DISCONTINUED] benzonatate (TESSALON PERLES) 100 MG capsule Take 1 capsule (100 mg total) by mouth 3 (three) times daily as needed for cough.    No facility-administered encounter medications on file as of 07/13/2016.     Allergies  Allergen Reactions  . Accupril  [Quinapril Hcl] Swelling    throat swells  . Quinapril Swelling    Review of Systems  Constitutional: Negative.   Eyes: Negative.     Respiratory: Negative.   Cardiovascular: Negative.   Gastrointestinal: Negative.   Genitourinary: Negative.   Musculoskeletal: Positive for myalgias.  Skin: Negative.   Neurological: Negative.   Endo/Heme/Allergies: Negative.   Psychiatric/Behavioral: Negative.     Objective:  BP 138/76   Pulse 76   Temp 98.2 F (36.8 C)   Resp 16   Wt 212 lb (96.2 kg)   BMI 32.23 kg/m   Physical Exam  Constitutional: He is oriented to person, place, and time and well-developed, well-nourished, and in no distress.  HENT:  Head: Normocephalic and atraumatic.  Eyes: Conjunctivae are normal. Pupils are equal, round, and reactive to light. No scleral icterus.  Neck: Normal range of motion. Neck supple.  Cardiovascular: Normal rate, regular rhythm, normal heart sounds and intact distal pulses.  Exam reveals no gallop.   No murmur heard. Pulmonary/Chest: Effort normal and breath sounds normal. No respiratory distress. He has no wheezes.  Abdominal: Soft.  Musculoskeletal: He exhibits no edema or tenderness.  Measurements today: 15 1/2 inches right and left calf-the same. No swelling today. Negative cords.  Neurological: He is alert and oriented to person, place, and time.  Skin: Skin is warm and dry.  Psychiatric: Mood, memory, affect and judgment normal.   Assessment and Plan :  1. Strain of calf muscle, left, initial encounter I think this is muscular, strain. Exam is normal today. Do not think this is a DVT issue. Advised patient to follow as needed.This will probably last for a few days/weeks, advised patient to use Tylenol and heating pad.  2. Essential hypertension 3. Hypercholesterolemia without hypertriglyceridemia 4. Type 2 diabetes mellitus without complication, without long-term current use of insulin (HCC)  HPI, Exam and A&P transcribed by Theressa Millard, RMA under direction and in the presence of Miguel Aschoff, MD. I have done the exam and reviewed the chart and it is  accurate to the best of my knowledge. Development worker, community has been used and  any errors in dictation or transcription are unintentional. Miguel Aschoff M.D. [facility]

## 2016-07-28 ENCOUNTER — Telehealth: Payer: Self-pay | Admitting: Family Medicine

## 2016-07-28 NOTE — Telephone Encounter (Signed)
Pt is requesting a call back from Dr Rosanna Randy to discuss a form that [facility] will be sending requesting records about patient diabetes.  Pt is also requesting a statement sent with these records from Dr Rosanna Randy about pt diabetes.  CB#[phone]/MW

## 2016-07-29 ENCOUNTER — Telehealth: Payer: Self-pay | Admitting: Family Medicine

## 2016-07-29 DIAGNOSIS — E119 Type 2 diabetes mellitus without complications: Secondary | ICD-10-CM | POA: Diagnosis not present

## 2016-07-29 DIAGNOSIS — B351 Tinea unguium: Secondary | ICD-10-CM | POA: Diagnosis not present

## 2016-07-29 NOTE — Telephone Encounter (Signed)
Left message to call back  

## 2016-07-29 NOTE — Telephone Encounter (Signed)
Left message saying that we have not received the form yet, but we will send over the necessary information once we receive it.

## 2016-07-29 NOTE — Telephone Encounter (Signed)
Pt returned call ° °teri °

## 2016-07-29 NOTE — Telephone Encounter (Signed)
Pt is returning call.  CB#[phone]/MW

## 2016-07-29 NOTE — Telephone Encounter (Signed)
PATIENT WOULD LIKE THIS MESSAGE TO GO TO DR. Tamala Julian: HE STATES DR. Tamala Julian WAS HIS PCP WHEN SHE WAS AT [place]. WHEN SHE LEFT TO COME HERE DR. Tamala Julian SUGGESTED HE TRANSFER HIS CARE TO DR. Miguel Aschoff. HE WOULD LIKE DR. Tamala Julian TO CALL AND DISCUSS HIS MEDICAL HISTORY WITH DR. GILBERT BECAUSE SHE IS VERY AWARE OF IT. HE SAID HE NEEDS THE [facility] TO KNOW HIS HISTORY AS WELL BECAUSE THEY DON'T HAVE ANY RECORD OF HIM BEING A DIABETIC. BEST PHONE [phone] (HOME) [place]

## 2016-08-04 ENCOUNTER — Ambulatory Visit (INDEPENDENT_AMBULATORY_CARE_PROVIDER_SITE_OTHER): Payer: Medicare Other | Admitting: Family Medicine

## 2016-08-04 ENCOUNTER — Ambulatory Visit (INDEPENDENT_AMBULATORY_CARE_PROVIDER_SITE_OTHER): Payer: Medicare Other

## 2016-08-04 VITALS — BP 150/72 | HR 80 | Temp 97.9°F | Ht 68.0 in | Wt 212.6 lb

## 2016-08-04 VITALS — BP 128/64 | HR 80 | Temp 97.9°F | Resp 14 | Wt 212.0 lb

## 2016-08-04 DIAGNOSIS — C61 Malignant neoplasm of prostate: Secondary | ICD-10-CM

## 2016-08-04 DIAGNOSIS — E119 Type 2 diabetes mellitus without complications: Secondary | ICD-10-CM

## 2016-08-04 DIAGNOSIS — I10 Essential (primary) hypertension: Secondary | ICD-10-CM

## 2016-08-04 DIAGNOSIS — E7800 Pure hypercholesterolemia, unspecified: Secondary | ICD-10-CM | POA: Diagnosis not present

## 2016-08-04 DIAGNOSIS — Z0000 Encounter for general adult medical examination without abnormal findings: Secondary | ICD-10-CM | POA: Diagnosis not present

## 2016-08-04 LAB — POCT UA - MICROALBUMIN: Microalbumin Ur, POC: 100 mg/L

## 2016-08-04 LAB — POCT GLYCOSYLATED HEMOGLOBIN (HGB A1C): Hemoglobin A1C: 6.7

## 2016-08-04 NOTE — Progress Notes (Signed)
Michael Murphy  MRN: [id number] DOB: 07-13-1927  Subjective:  HPI  Patient is here for follow up. Diabetes: patient checks his sugar once a week and reading fasting usually 125-132.  Lab Results  Component Value Date   HGBA1C 6.4 05/02/2015   B/P:  Not checking his b/p. BP Readings from Last 3 Encounters:  08/04/16 128/64  08/04/16 (!) 150/72  07/13/16 138/76   [facility] when he has his [facility] with them but he is not sure when it is. Patient Active Problem List   Diagnosis Date Noted  . Abnormal laboratory test 07/12/2014  . Carpal tunnel syndrome 07/12/2014  . Epididymitis 07/12/2014  . Post-operative state 07/12/2014  . Adiposity 07/12/2014  . Neurosis, posttraumatic 07/12/2014  . CA of prostate ([place]) 07/12/2014  . Excessive urination at night 01/23/2013  . ED (erectile dysfunction) of organic origin 01/23/2013  . Malignant neoplasm of prostate ([place]) 12/07/2011  . Diabetic neuropathy ([place]) 02/12/2009  . Allergic rhinitis 02/11/2007  . Benign essential HTN 02/11/2007  . Arthritis, degenerative 02/11/2007  . OP (osteoporosis) 02/11/2007  . Hypercholesterolemia without hypertriglyceridemia 02/11/2007  . Current tobacco use 02/11/2007    Past Medical History:  Diagnosis Date  . Diabetes mellitus without complication ([place])   . Hypertension     Social History   Social History  . Marital status: Widowed    Spouse name: N/A  . Number of children: N/A  . Years of education: N/A   Occupational History  . Not on file.   Social History Main Topics  . Smoking status: Former Smoker    Packs/day: 0.50    Years: 18.00    Types: Cigarettes    Quit date: 03/09/1991  . Smokeless tobacco: Never Used  . Alcohol use 3.0 oz/week    2 Standard drinks or equivalent, 3 Cans of beer per week  . Drug use: No  . Sexual activity: Not on file   Other Topics Concern  . Not on file   Social History Narrative  . No narrative on file    Outpatient  Encounter Prescriptions as of 08/04/2016  Medication Sig Note  . alendronate (FOSAMAX) 70 MG tablet TAKE 1 TABLET ONCE EACH WEEK. TAKE WITH A FULL GLASS OF WATER ON AN EMPTY STOMACH   . amLODipine (NORVASC) 10 MG tablet Take 1 tablet by mouth daily. 07/12/2014: DX: 401.1 Received from: [place]:   . aspirin 81 MG chewable tablet Chew by mouth daily.   . budesonide (PULMICORT) 180 MCG/ACT inhaler Inhale 2 puffs into the lungs 2 (two) times daily.   Marland Kitchen FLAXSEED, LINSEED, PO Take 2 capsules by mouth daily. 07/12/2014: Received from: [place]:   . Garlic Oil 6812 MG CAPS Take 1 capsule by mouth daily. 07/12/2014: Received from: [place]: GARLIC OIL, 1000MG  (Oral Capsule)  1 Every Day for 0 days  Quantity: 0.00;  Refills: 0   Ordered :25-Feb-2010  Doy Hutching ;  Started 14-Apr-2007 Active  . metFORMIN (GLUCOPHAGE) 500 MG tablet Take 1 tablet by mouth daily. 07/12/2014: Received from: [place]:   . Multiple Vitamins-Minerals (MENS MULTIVITAMIN PLUS PO) Take by mouth daily.   . Omega-3 Fatty Acids (FISH OIL) 1000 MG CAPS Take 1 capsule by mouth daily. 07/12/2014: Received from: [facility]: FISH OIL CONCENTRATE, 1000MG  (Oral Capsule)  1 Three Times Daily for 0 days  Quantity: 0.00;  Refills: 0  Ordered :25-Feb-2010  Doy Hutching ;  Started 14-Apr-2007 Active  . simvastatin (ZOCOR) 20 MG tablet Take 1 tablet by mouth at bedtime. 07/12/2014: Received from: [facility]:    No facility-administered encounter medications on file as of 08/04/2016.     Allergies  Allergen Reactions  . Accupril  [Quinapril Hcl] Swelling    throat swells  . Quinapril Swelling    Review of Systems  Constitutional: Positive for malaise/fatigue.  HENT: Positive for tinnitus.   Eyes: Positive for discharge and redness.  Respiratory:  Negative.   Cardiovascular: Negative.   Gastrointestinal: Positive for constipation.  Genitourinary:       Testicular pain, scrotal swelling, incontinence.  Musculoskeletal: Positive for joint pain.       Neck stiffness  Skin: Positive for rash.  Neurological:       Numbness  Endo/Heme/Allergies: Negative.   Psychiatric/Behavioral: The patient is nervous/anxious and has insomnia.     Objective:  BP 128/64   Pulse 80   Temp 97.9 F (36.6 C)   Resp 14   Wt 212 lb (96.2 kg)   BMI 32.23 kg/m   Physical Exam  Constitutional: He is oriented to person, place, and time and well-developed, well-nourished, and in no distress.  HENT:  Head: Normocephalic and atraumatic.  Right Ear: External ear normal.  Left Ear: External ear normal.  Nose: Nose normal.  Eyes: Conjunctivae are normal.  Neck: Neck supple.  Cardiovascular: Normal rate, regular rhythm and normal heart sounds.   Pulmonary/Chest: Effort normal and breath sounds normal.  Abdominal: Soft.  Neurological: He is alert and oriented to person, place, and time. Gait normal. GCS score is 15.  Skin: Skin is warm and dry.  Psychiatric: Mood, memory, affect and judgment normal.    Assessment and Plan :  1. Type 2 diabetes mellitus without complication, without long-term current use of insulin (HCC) A1C 6.7 - POCT HgB A1C - POCT UA - Microalbumin--100 Angioedema from ACEI. 2. Essential hypertension  - CBC with Differential/Platelet - TSH  3. Hypercholesterolemia without hypertriglyceridemia  - Lipid Panel With LDL/HDL Ratio - Comprehensive metabolic panel  4. CA of prostate (HCC)  - PSA 5.Neuropathy of both Feet 6.Constipsation Try pericolace daily.  HPI, Exam and A&P transcribed by Theressa Millard, RMA under direction and in the presence of Miguel Aschoff, MD. I have done the exam and reviewed the chart and it is accurate to the best of my knowledge. Development worker, community has been used and  any errors in  dictation or transcription are unintentional. Miguel Aschoff M.D. [facility]

## 2016-08-04 NOTE — Patient Instructions (Signed)
Michael Murphy , Thank you for taking time to come for your Medicare Wellness Visit. I appreciate your ongoing commitment to your health goals. Please review the following plan we discussed and let me know if I can assist you in the future.   Screening recommendations/referrals: Colonoscopy: completed 07/23/10 Recommended yearly ophthalmology/optometry visit for glaucoma screening and checkup Recommended yearly dental visit for hygiene and checkup  Vaccinations: Influenza vaccine: up to date, due 11/2016 Pneumococcal vaccine: completed series Tdap vaccine: completed 12/26/10, due 12/2020 Shingles vaccine: completed per patient   Advanced directives: Advance directive discussed with you today. Even though you declined this today please call our office should you change your mind and we can give you the proper paperwork for you to fill out.  Conditions/risks identified: Obesity, recommend increasing fruits and vegetables in diet to at least two servings a day.  Next appointment: None, need to schedule 1 year AWV.  Preventive Care 58 Years and Older, Male Preventive care refers to lifestyle choices and visits with your health care provider that can promote health and wellness. What does preventive care include?  A yearly physical exam. This is also called an annual well check.  Dental exams once or twice a year.  Routine eye exams. Ask your health care provider how often you should have your eyes checked.  Personal lifestyle choices, including:  Daily care of your teeth and gums.  Regular physical activity.  Eating a healthy diet.  Avoiding tobacco and drug use.  Limiting alcohol use.  Practicing safe sex.  Taking low doses of aspirin every day.  Taking vitamin and mineral supplements as recommended by your health care provider. What happens during an annual well check? The services and screenings done by your health care provider during your annual well check will depend on  your age, overall health, lifestyle risk factors, and family history of disease. Counseling  Your health care provider may ask you questions about your:  Alcohol use.  Tobacco use.  Drug use.  Emotional well-being.  Home and relationship well-being.  Sexual activity.  Eating habits.  History of falls.  Memory and ability to understand (cognition).  Work and work Statistician. Screening  You may have the following tests or measurements:  Height, weight, and BMI.  Blood pressure.  Lipid and cholesterol levels. These may be checked every 5 years, or more frequently if you are over 62 years old.  Skin check.  Lung cancer screening. You may have this screening every year starting at age 65 if you have a 30-pack-year history of smoking and currently smoke or have quit within the past 15 years.  Fecal occult blood test (FOBT) of the stool. You may have this test every year starting at age 75.  Flexible sigmoidoscopy or colonoscopy. You may have a sigmoidoscopy every 5 years or a colonoscopy every 10 years starting at age 61.  Prostate cancer screening. Recommendations will vary depending on your family history and other risks.  Hepatitis C blood test.  Hepatitis B blood test.  Sexually transmitted disease (STD) testing.  Diabetes screening. This is done by checking your blood sugar (glucose) after you have not eaten for a while (fasting). You may have this done every 1-3 years.  Abdominal aortic aneurysm (AAA) screening. You may need this if you are a current or former smoker.  Osteoporosis. You may be screened starting at age 48 if you are at high risk. Talk with your health care provider about your test results, treatment options, and  if necessary, the need for more tests. Vaccines  Your health care provider may recommend certain vaccines, such as:  Influenza vaccine. This is recommended every year.  Tetanus, diphtheria, and acellular pertussis (Tdap, Td) vaccine.  You may need a Td booster every 10 years.  Zoster vaccine. You may need this after age 25.  Pneumococcal 13-valent conjugate (PCV13) vaccine. One dose is recommended after age 20.  Pneumococcal polysaccharide (PPSV23) vaccine. One dose is recommended after age 48. Talk to your health care provider about which screenings and vaccines you need and how often you need them. This information is not intended to replace advice given to you by your health care provider. Make sure you discuss any questions you have with your health care provider. Document Released: 03/22/2015 Document Revised: 11/13/2015 Document Reviewed: 12/25/2014 Elsevier Interactive Patient Education  2017 [place] Prevention in the Home Falls can cause injuries. They can happen to people of all ages. There are many things you can do to make your home safe and to help prevent falls. What can I do on the outside of my home?  Regularly fix the edges of walkways and driveways and fix any cracks.  Remove anything that might make you trip as you walk through a door, such as a raised step or threshold.  Trim any bushes or trees on the path to your home.  Use bright outdoor lighting.  Clear any walking paths of anything that might make someone trip, such as rocks or tools.  Regularly check to see if handrails are loose or broken. Make sure that both sides of any steps have handrails.  Any raised decks and porches should have guardrails on the edges.  Have any leaves, snow, or ice cleared regularly.  Use sand or salt on walking paths during winter.  Clean up any spills in your garage right away. This includes oil or grease spills. What can I do in the bathroom?  Use night lights.  Install grab bars by the toilet and in the tub and shower. Do not use towel bars as grab bars.  Use non-skid mats or decals in the tub or shower.  If you need to sit down in the shower, use a plastic, non-slip stool.  Keep the  floor dry. Clean up any water that spills on the floor as soon as it happens.  Remove soap buildup in the tub or shower regularly.  Attach bath mats securely with double-sided non-slip rug tape.  Do not have throw rugs and other things on the floor that can make you trip. What can I do in the bedroom?  Use night lights.  Make sure that you have a light by your bed that is easy to reach.  Do not use any sheets or blankets that are too big for your bed. They should not hang down onto the floor.  Have a firm chair that has side arms. You can use this for support while you get dressed.  Do not have throw rugs and other things on the floor that can make you trip. What can I do in the kitchen?  Clean up any spills right away.  Avoid walking on wet floors.  Keep items that you use a lot in easy-to-reach places.  If you need to reach something above you, use a strong step stool that has a grab bar.  Keep electrical cords out of the way.  Do not use floor polish or wax that makes floors slippery. If you  must use wax, use non-skid floor wax.  Do not have throw rugs and other things on the floor that can make you trip. What can I do with my stairs?  Do not leave any items on the stairs.  Make sure that there are handrails on both sides of the stairs and use them. Fix handrails that are broken or loose. Make sure that handrails are as long as the stairways.  Check any carpeting to make sure that it is firmly attached to the stairs. Fix any carpet that is loose or worn.  Avoid having throw rugs at the top or bottom of the stairs. If you do have throw rugs, attach them to the floor with carpet tape.  Make sure that you have a light switch at the top of the stairs and the bottom of the stairs. If you do not have them, ask someone to add them for you. What else can I do to help prevent falls?  Wear shoes that:  Do not have high heels.  Have rubber bottoms.  Are comfortable and fit  you well.  Are closed at the toe. Do not wear sandals.  If you use a stepladder:  Make sure that it is fully opened. Do not climb a closed stepladder.  Make sure that both sides of the stepladder are locked into place.  Ask someone to hold it for you, if possible.  Clearly mark and make sure that you can see:  Any grab bars or handrails.  First and last steps.  Where the edge of each step is.  Use tools that help you move around (mobility aids) if they are needed. These include:  Canes.  Walkers.  Scooters.  Crutches.  Turn on the lights when you go into a dark area. Replace any light bulbs as soon as they burn out.  Set up your furniture so you have a clear path. Avoid moving your furniture around.  If any of your floors are uneven, fix them.  If there are any pets around you, be aware of where they are.  Review your medicines with your doctor. Some medicines can make you feel dizzy. This can increase your chance of falling. Ask your doctor what other things that you can do to help prevent falls. This information is not intended to replace advice given to you by your health care provider. Make sure you discuss any questions you have with your health care provider. Document Released: 12/20/2008 Document Revised: 08/01/2015 Document Reviewed: 03/30/2014 Elsevier Interactive Patient Education  2017 [employer].

## 2016-08-04 NOTE — Progress Notes (Signed)
Subjective:   Michael Murphy is a 83 y.o. male who presents for Medicare Annual/Subsequent preventive examination.  Review of Systems:  N/A  Cardiac Risk Factors include: advanced age (>87men, >4 women);diabetes mellitus;dyslipidemia;hypertension;obesity (BMI >30kg/m2)     Objective:    Vitals: BP (!) 150/72 (BP Location: Right Arm)   Pulse 80   Temp 97.9 F (36.6 C) (Oral)   Ht 5\' 8"  (1.727 m)   Wt 212 lb 9.6 oz (96.4 kg)   BMI 32.33 kg/m   Body mass index is 32.33 kg/m.  Tobacco History  Smoking Status  . Former Smoker  . Packs/day: 0.50  . Years: 18.00  . Types: Cigarettes  . Quit date: 03/09/1991  Smokeless Tobacco  . Never Used     Counseling given: Not Answered   Past Medical History:  Diagnosis Date  . Diabetes mellitus without complication ([place])   . Hypertension    Past Surgical History:  Procedure Laterality Date  . CATARACT EXTRACTION    . KNEE ARTHROSCOPY     left  . PROSTATECTOMY    . ROTATOR CUFF REPAIR     Family History  Problem Relation Age of Onset  . Hypertension Mother   . Stroke Mother   . Diabetes Sister   . Hypertension Sister   . Diabetes Brother   . Diabetes Brother    History  Sexual Activity  . Sexual activity: Not on file    Outpatient Encounter Prescriptions as of 08/04/2016  Medication Sig  . alendronate (FOSAMAX) 70 MG tablet TAKE 1 TABLET ONCE EACH WEEK. TAKE WITH A FULL GLASS OF WATER ON AN EMPTY STOMACH  . amLODipine (NORVASC) 10 MG tablet Take 1 tablet by mouth daily.  Marland Kitchen aspirin 81 MG chewable tablet Chew by mouth daily.  . budesonide (PULMICORT) 180 MCG/ACT inhaler Inhale 2 puffs into the lungs 2 (two) times daily.  Marland Kitchen FLAXSEED, LINSEED, PO Take 2 capsules by mouth daily.  . Garlic Oil 1610 MG CAPS Take 1 capsule by mouth daily.  . metFORMIN (GLUCOPHAGE) 500 MG tablet Take 1 tablet by mouth daily.  . Multiple Vitamins-Minerals (MENS MULTIVITAMIN PLUS PO) Take by mouth daily.  . Omega-3 Fatty Acids (FISH OIL)  1000 MG CAPS Take 1 capsule by mouth daily.  . simvastatin (ZOCOR) 20 MG tablet Take 1 tablet by mouth at bedtime.  . [DISCONTINUED] meloxicam (MOBIC) 7.5 MG tablet Take 1 tablet (7.5 mg total) by mouth 2 (two) times daily as needed for pain. (Patient not taking: Reported on 08/04/2016)   No facility-administered encounter medications on file as of 08/04/2016.     Activities of Daily Living In your present state of health, do you have any difficulty performing the following activities: 08/04/2016  Hearing? Y  Vision? N  Difficulty concentrating or making decisions? N  Walking or climbing stairs? Y  Dressing or bathing? N  Doing errands, shopping? N  Preparing Food and eating ? N  Using the Toilet? N  In the past six months, have you accidently leaked urine? Y  Do you have problems with loss of bowel control? N  Managing your Medications? N  Managing your Finances? N  Housekeeping or managing your Housekeeping? N  Some recent data might be hidden    Patient Care Team: Jerrol Banana., MD as PCP - General (Family Medicine) Sharlotte Alamo, DPM as Consulting Physician (Podiatry) Beacher May, MD as Referring Physician (Internal Medicine)   Assessment:     Exercise Activities and Dietary recommendations  Current Exercise Habits: The patient does not participate in regular exercise at present, Exercise limited by: None identified (arthritis)  Goals    . Eat more fruits and vegetables          Recommend increasing fruits and vegetables in diet to at least two servings a day.      Fall Risk Fall Risk  08/04/2016 11/06/2015 08/22/2014  Falls in the past year? No Yes No  Number falls in past yr: - 1 -  Injury with Fall? - Yes -   Depression Screen PHQ 2/9 Scores 08/04/2016 08/22/2014  PHQ - 2 Score 5 0  PHQ- 9 Score 18 -    Cognitive Function     6CIT Screen 08/04/2016  What Year? 0 points  What month? 0 points  What time? 0 points  Count back from 20 0 points    Months in reverse 0 points  Repeat phrase 2 points  Total Score 2    Immunization History  Administered Date(s) Administered  . Influenza Split 11/24/2008, 12/03/2009, 12/26/2010  . Influenza, High Dose Seasonal PF 12/03/2015  . Influenza-Unspecified 12/08/2014  . Pneumococcal Conjugate-13 09/18/2013, 08/22/2014  . Tdap 12/26/2010   Screening Tests Health Maintenance  Topic Date Due  . FOOT EXAM  02/18/1938  . URINE MICROALBUMIN  02/18/1940  . HEMOGLOBIN A1C  10/30/2015  . OPHTHALMOLOGY EXAM  09/06/2016  . INFLUENZA VACCINE  10/07/2016  . TETANUS/TDAP  12/25/2020  . PNA vac Low Risk Adult  Completed      Plan:  I have personally reviewed and addressed the Medicare Annual Wellness questionnaire and have noted the following in the patient's chart:  A. Medical and social history B. Use of alcohol, tobacco or illicit drugs  C. Current medications and supplements D. Functional ability and status E.  Nutritional status F.  Physical activity G. Advance directives H. List of other physicians I.  Hospitalizations, surgeries, and ER visits in previous 12 months J.  [place] such as hearing and vision if needed, cognitive and depression L. Referrals and appointments - none  In addition, I have reviewed and discussed with patient certain preventive protocols, quality metrics, and best practice recommendations. A written personalized care plan for preventive services as well as general preventive health recommendations were provided to patient.  See attached scanned questionnaire for additional information.   Signed,  Fabio Neighbors, LPN Nurse Health Advisor   MD Recommendations: Pt needs a diabetic foot exam, urine microalbumin and Hgb A1c done today at [facility].  I have reviewed the health advisors note, was  available for consultation and I agree with documentation and plan. Miguel Aschoff MD [facility]

## 2016-08-05 DIAGNOSIS — I10 Essential (primary) hypertension: Secondary | ICD-10-CM | POA: Diagnosis not present

## 2016-08-05 DIAGNOSIS — C61 Malignant neoplasm of prostate: Secondary | ICD-10-CM | POA: Diagnosis not present

## 2016-08-05 DIAGNOSIS — E7800 Pure hypercholesterolemia, unspecified: Secondary | ICD-10-CM | POA: Diagnosis not present

## 2016-08-06 LAB — CBC WITH DIFFERENTIAL/PLATELET
Basophils Absolute: 0 10*3/uL (ref 0.0–0.2)
Basos: 0 %
EOS (ABSOLUTE): 0.1 10*3/uL (ref 0.0–0.4)
Eos: 2 %
Hematocrit: 42.8 % (ref 37.5–51.0)
Hemoglobin: 14.1 g/dL (ref 13.0–17.7)
Immature Grans (Abs): 0 10*3/uL (ref 0.0–0.1)
Immature Granulocytes: 0 %
Lymphocytes Absolute: 2.5 10*3/uL (ref 0.7–3.1)
Lymphs: 33 %
MCH: 28.1 pg (ref 26.6–33.0)
MCHC: 32.9 g/dL (ref 31.5–35.7)
MCV: 85 fL (ref 79–97)
Monocytes Absolute: 0.6 10*3/uL (ref 0.1–0.9)
Monocytes: 8 %
Neutrophils Absolute: 4.2 10*3/uL (ref 1.4–7.0)
Neutrophils: 57 %
Platelets: 204 10*3/uL (ref 150–379)
RBC: 5.02 x10E6/uL (ref 4.14–5.80)
RDW: 15.2 % (ref 12.3–15.4)
WBC: 7.4 10*3/uL (ref 3.4–10.8)

## 2016-08-06 LAB — PSA: Prostate Specific Ag, Serum: 0.9 ng/mL (ref 0.0–4.0)

## 2016-08-06 LAB — COMPREHENSIVE METABOLIC PANEL
ALT: 18 IU/L (ref 0–44)
AST: 20 IU/L (ref 0–40)
Albumin/Globulin Ratio: 1.5 (ref 1.2–2.2)
Albumin: 4.8 g/dL — ABNORMAL HIGH (ref 3.5–4.7)
Alkaline Phosphatase: 56 IU/L (ref 39–117)
BUN/Creatinine Ratio: 11 (ref 10–24)
BUN: 10 mg/dL (ref 8–27)
Bilirubin Total: 0.6 mg/dL (ref 0.0–1.2)
CO2: 22 mmol/L (ref 18–29)
Calcium: 9.5 mg/dL (ref 8.6–10.2)
Chloride: 104 mmol/L (ref 96–106)
Creatinine, Ser: 0.94 mg/dL (ref 0.76–1.27)
GFR calc Af Amer: 85 mL/min/{1.73_m2} (ref >59)
GFR calc non Af Amer: 73 mL/min/{1.73_m2} (ref >59)
Globulin, Total: 3.1 g/dL (ref 1.5–4.5)
Glucose: 110 mg/dL — ABNORMAL HIGH (ref 65–99)
Potassium: 4.4 mmol/L (ref 3.5–5.2)
Sodium: 141 mmol/L (ref 134–144)
Total Protein: 7.9 g/dL (ref 6.0–8.5)

## 2016-08-06 LAB — LIPID PANEL WITH LDL/HDL RATIO
Cholesterol, Total: 185 mg/dL (ref 100–199)
HDL: 68 mg/dL (ref >39)
LDL Calculated: 104 mg/dL — ABNORMAL HIGH (ref 0–99)
LDl/HDL Ratio: 1.5 ratio (ref 0.0–3.6)
Triglycerides: 66 mg/dL (ref 0–149)
VLDL Cholesterol Cal: 13 mg/dL (ref 5–40)

## 2016-08-06 LAB — TSH: TSH: 1.48 u[IU]/mL (ref 0.450–4.500)

## 2016-10-29 DIAGNOSIS — B351 Tinea unguium: Secondary | ICD-10-CM | POA: Diagnosis not present

## 2016-10-29 DIAGNOSIS — E119 Type 2 diabetes mellitus without complications: Secondary | ICD-10-CM | POA: Diagnosis not present

## 2016-10-30 ENCOUNTER — Ambulatory Visit (INDEPENDENT_AMBULATORY_CARE_PROVIDER_SITE_OTHER): Payer: Medicare Other | Admitting: Physician Assistant

## 2016-10-30 ENCOUNTER — Ambulatory Visit
Admission: RE | Admit: 2016-10-30 | Discharge: 2016-10-30 | Disposition: A | Discharge status: Home / Self Care | Payer: Medicare Other | Source: Ambulatory Visit | Attending: Physician Assistant | Admitting: Physician Assistant

## 2016-10-30 ENCOUNTER — Encounter: Payer: Self-pay | Admitting: Physician Assistant

## 2016-10-30 ENCOUNTER — Telehealth: Payer: Self-pay

## 2016-10-30 VITALS — BP 134/78 | HR 79 | Temp 98.1°F | Wt 211.8 lb

## 2016-10-30 DIAGNOSIS — R229 Localized swelling, mass and lump, unspecified: Secondary | ICD-10-CM | POA: Diagnosis not present

## 2016-10-30 DIAGNOSIS — I700 Atherosclerosis of aorta: Secondary | ICD-10-CM | POA: Diagnosis not present

## 2016-10-30 DIAGNOSIS — M898X1 Other specified disorders of bone, shoulder: Secondary | ICD-10-CM | POA: Insufficient documentation

## 2016-10-30 DIAGNOSIS — R079 Chest pain, unspecified: Secondary | ICD-10-CM | POA: Diagnosis not present

## 2016-10-30 NOTE — Telephone Encounter (Signed)
-----   Message from Trinna Post, [place] sent at 10/30/2016 12:07 PM EDT ----- Normal CXR. Call us back with problems or if not improving.

## 2016-10-30 NOTE — Patient Instructions (Signed)
Lipoma A lipoma is a noncancerous (benign) tumor that is made up of fat cells. This is a very common type of soft-tissue growth. Lipomas are usually found under the skin (subcutaneous). They may occur in any tissue of the body that contains fat. Common areas for lipomas to appear include the back, shoulders, buttocks, and thighs. Lipomas grow slowly, and they are usually painless. Most lipomas do not cause problems and do not require treatment. What are the causes? The cause of this condition is not known. What increases the risk? This condition is more likely to develop in:  People who are 40-60 years old.  People who have a family history of lipomas.  What are the signs or symptoms? A lipoma usually appears as a small, round bump under the skin. It may feel soft or rubbery, but the firmness can vary. Most lipomas are not painful. However, a lipoma may become painful if it is located in an area where it pushes on nerves. How is this diagnosed? A lipoma can usually be diagnosed with a physical exam. You may also have tests to confirm the diagnosis and to rule out other conditions. Tests may include:  Imaging tests, such as a CT scan or MRI.  Removal of a tissue sample to be looked at under a microscope (biopsy).  How is this treated? Treatment is not needed for small lipomas that are not causing problems. If a lipoma continues to get bigger or it causes problems, removal is often the best option. Lipomas can also be removed to improve appearance. Removal of a lipoma is usually done with a surgery in which the fatty cells and the surrounding capsule are removed. Most often, a medicine that numbs the area (local anesthetic) is used for this procedure. Follow these instructions at home:  Keep all follow-up visits as directed by your health care provider. This is important. Contact a health care provider if:  Your lipoma becomes larger or hard.  Your lipoma becomes painful, red, or  increasingly swollen. These could be signs of infection or a more serious condition. This information is not intended to replace advice given to you by your health care provider. Make sure you discuss any questions you have with your health care provider. Document Released: 02/13/2002 Document Revised: 08/01/2015 Document Reviewed: 02/19/2014 Elsevier Interactive Patient Education  2018 [employer]  

## 2016-10-30 NOTE — Progress Notes (Signed)
Patient: Michael Murphy Male    DOB: [birth year]   83 y.o.   MRN: [id number] Visit Date: 10/30/2016  Today's Provider: Trinna Post, PA-C   Chief Complaint  Patient presents with  . Neck Pain   Subjective:     Michael Murphy is an 83 y/o man with history of osteoporosis on Fosamax presenting today with neck pain. He says it started yesterday suddenly with pain between collar bones. No trouble breathing, swallowing, chest pain, abdominal pain. No redness. There is some swelling in the area, no pus or drainage.   Neck Pain   This is a new problem. The current episode started yesterday. The problem occurs constantly. The problem has been unchanged. The pain is associated with nothing. Pain location: midline front of the neck. The quality of the pain is described as aching. The symptoms are aggravated by position (movement). The pain is worse during the night. Pertinent negatives include no chest pain, trouble swallowing or weakness. He has tried nothing for the symptoms.      Previous Medications   ALENDRONATE (FOSAMAX) 70 MG TABLET    TAKE 1 TABLET ONCE EACH WEEK. TAKE WITH A FULL GLASS OF WATER ON AN EMPTY STOMACH   AMLODIPINE (NORVASC) 10 MG TABLET    Take 1 tablet by mouth daily.   ASPIRIN 81 MG CHEWABLE TABLET    Chew by mouth daily.   BUDESONIDE (PULMICORT) 180 MCG/ACT INHALER    Inhale 2 puffs into the lungs 2 (two) times daily.   FLAXSEED, LINSEED, PO    Take 2 capsules by mouth daily.   GARLIC OIL 9485 MG CAPS    Take 1 capsule by mouth daily.   METFORMIN (GLUCOPHAGE) 500 MG TABLET    Take 1 tablet by mouth daily.   MULTIPLE VITAMINS-MINERALS (MENS MULTIVITAMIN PLUS PO)    Take by mouth daily.   OMEGA-3 FATTY ACIDS (FISH OIL) 1000 MG CAPS    Take 1 capsule by mouth daily.   SIMVASTATIN (ZOCOR) 20 MG TABLET    Take 1 tablet by mouth at bedtime.   SIMVASTATIN (ZOCOR) 40 MG TABLET        Review of Systems  Constitutional: Negative.   HENT: Negative for trouble swallowing.     Respiratory: Negative.   Cardiovascular: Negative.  Negative for chest pain.  Musculoskeletal: Positive for neck pain.  Neurological: Negative for weakness.    Social History  Substance Use Topics  . Smoking status: Former Smoker    Packs/day: 0.50    Years: 18.00    Types: Cigarettes    Quit date: 03/09/1991  . Smokeless tobacco: Never Used  . Alcohol use 3.0 oz/week    2 Standard drinks or equivalent, 3 Cans of beer per week   Objective:   BP 134/78 (BP Location: Left Arm, Patient Position: Sitting, Cuff Size: Normal)   Pulse 79   Temp 98.1 F (36.7 C) (Oral)   Wt 211 lb 12.8 oz (96.1 kg)   SpO2 98%   BMI 32.20 kg/m   Physical Exam  Constitutional: He is oriented to person, place, and time.  Cardiovascular: Normal rate and regular rhythm.   Pulmonary/Chest: Effort normal. No respiratory distress.  Coarse sounding in bilateral lower lung fields.  Neurological: He is alert and oriented to person, place, and time.  Skin: Skin is warm and dry.     Psychiatric: He has a normal mood and affect. His behavior is normal.        Assessment &  Plan:     1. Soft tissue swelling  Discussed with Dr. Caryn Section. Likely early small cyst. Non pulsatile, patient with Normal vitals and NAD. Normal CXR. Call back if increasing in redness or not improving.   - DG Chest 2 View; Future  2. Collar bone pain  - DG Chest 2 View; Future    Follow up: Return if symptoms worsen or fail to improve.

## 2016-10-30 NOTE — Telephone Encounter (Signed)
LMTCB

## 2016-10-30 NOTE — Telephone Encounter (Signed)
Pt advised-aa 

## 2016-11-02 DIAGNOSIS — M1711 Unilateral primary osteoarthritis, right knee: Secondary | ICD-10-CM | POA: Diagnosis not present

## 2016-12-01 ENCOUNTER — Ambulatory Visit (INDEPENDENT_AMBULATORY_CARE_PROVIDER_SITE_OTHER): Payer: Medicare Other | Admitting: Family Medicine

## 2016-12-01 ENCOUNTER — Encounter: Payer: Self-pay | Admitting: Family Medicine

## 2016-12-01 VITALS — BP 122/60 | HR 72 | Temp 97.8°F | Resp 16 | Wt 216.0 lb

## 2016-12-01 DIAGNOSIS — E119 Type 2 diabetes mellitus without complications: Secondary | ICD-10-CM | POA: Diagnosis not present

## 2016-12-01 DIAGNOSIS — Z23 Encounter for immunization: Secondary | ICD-10-CM

## 2016-12-01 DIAGNOSIS — E7800 Pure hypercholesterolemia, unspecified: Secondary | ICD-10-CM

## 2016-12-01 DIAGNOSIS — I10 Essential (primary) hypertension: Secondary | ICD-10-CM | POA: Diagnosis not present

## 2016-12-01 LAB — POCT GLYCOSYLATED HEMOGLOBIN (HGB A1C): Hemoglobin A1C: 7

## 2016-12-01 NOTE — Progress Notes (Signed)
Patient: Michael Murphy Male    DOB: March 11, 1927   83 y.o.   MRN: [id number] Visit Date: 12/01/2016  Today's Provider: [name], MD   Chief Complaint  Patient presents with  . Diabetes  . Hypertension  . Hyperlipidemia   Subjective:    HPI  Diabetes Mellitus Type II, Follow-up:   Lab Results  Component Value Date   HGBA1C 6.7 08/04/2016   HGBA1C 6.4 05/02/2015   HGBA1C 6.5 (A) 09/18/2013    Last seen for diabetes 4 months ago.  Management since then includes none. He reports good compliance with treatment. He is not having side effects.  Home blood sugar records: 130's  Episodes of hypoglycemia? no   Current Insulin Regimen: n/a Most Recent Eye Exam: 09/07/15 Weight trend: stable  Pertinent Labs:    Component Value Date/Time   CHOL 185 08/05/2016 0916   TRIG 66 08/05/2016 0916   HDL 68 08/05/2016 0916   LDLCALC 104 (H) 08/05/2016 0916   CREATININE 0.94 08/05/2016 0916    Wt Readings from Last 3 Encounters:  12/01/16 216 lb (98 kg)  10/30/16 211 lb 12.8 oz (96.1 kg)  08/04/16 212 lb (96.2 kg)    ------------------------------------------------------------------------      Allergies  Allergen Reactions  . Accupril  [Quinapril Hcl] Swelling    throat swells  . Quinapril Swelling     Current Outpatient Prescriptions:  .  alendronate (FOSAMAX) 70 MG tablet, TAKE 1 TABLET ONCE EACH WEEK. TAKE WITH A FULL GLASS OF WATER ON AN EMPTY STOMACH, Disp: 12 tablet, Rfl: 3 .  amLODipine (NORVASC) 10 MG tablet, Take 1 tablet by mouth daily., Disp: , Rfl:  .  aspirin 81 MG chewable tablet, Chew by mouth daily., Disp: , Rfl:  .  budesonide (PULMICORT) 180 MCG/ACT inhaler, Inhale 2 puffs into the lungs 2 (two) times daily., Disp: 1 Inhaler, Rfl: 0 .  FLAXSEED, LINSEED, PO, Take 2 capsules by mouth daily., Disp: , Rfl:  .  Garlic Oil 7494 MG CAPS, Take 1 capsule by mouth daily., Disp: , Rfl:  .  metFORMIN (GLUCOPHAGE) 500 MG tablet, Take 1 tablet by  mouth daily., Disp: , Rfl:  .  Multiple Vitamins-Minerals (MENS MULTIVITAMIN PLUS PO), Take by mouth daily., Disp: , Rfl:  .  Omega-3 Fatty Acids (FISH OIL) 1000 MG CAPS, Take 1 capsule by mouth daily., Disp: , Rfl:  .  simvastatin (ZOCOR) 20 MG tablet, Take 1 tablet by mouth at bedtime., Disp: , Rfl:  .  simvastatin (ZOCOR) 40 MG tablet, , Disp: , Rfl:   Review of Systems  Constitutional: Negative.   HENT: Negative.   Eyes: Negative.   Respiratory: Negative.   Cardiovascular: Negative.   Gastrointestinal: Negative.   Endocrine: Negative.   Genitourinary: Negative.   Musculoskeletal: Negative.        Tingling in feet   Skin: Negative.   Allergic/Immunologic: Negative.   Neurological: Negative.   Hematological: Negative.   Psychiatric/Behavioral: Negative.     Social History  Substance Use Topics  . Smoking status: Former Smoker    Packs/day: 0.50    Years: 18.00    Types: Cigarettes    Quit date: 03/09/1991  . Smokeless tobacco: Never Used  . Alcohol use 3.0 oz/week    2 Standard drinks or equivalent, 3 Cans of beer per week   Objective:   BP 122/60 (BP Location: Left Arm, Patient Position: Sitting, Cuff Size: Large)   Pulse 72  Temp 97.8 F (36.6 C) (Oral)   Resp 16   Wt 216 lb (98 kg)   BMI 32.84 kg/m  Vitals:   12/01/16 1421  BP: 122/60  Pulse: 72  Resp: 16  Temp: 97.8 F (36.6 C)  TempSrc: Oral  Weight: 216 lb (98 kg)     Physical Exam  Constitutional: He is oriented to person, place, and time. He appears well-developed and well-nourished.  HENT:  Head: Normocephalic and atraumatic.  Right Ear: External ear normal.  Left Ear: External ear normal.  Nose: Nose normal.  Eyes: Pupils are equal, round, and reactive to light. Conjunctivae and EOM are normal. No scleral icterus.  Neck: Normal range of motion. Neck supple. No thyromegaly present.  Cardiovascular: Normal rate, regular rhythm, normal heart sounds and intact distal pulses.     Pulmonary/Chest: Effort normal and breath sounds normal.  Abdominal: Soft.  Musculoskeletal: Normal range of motion.  Lymphadenopathy:    He has no cervical adenopathy.  Neurological: He is alert and oriented to person, place, and time. He has normal reflexes.  Skin: Skin is warm and dry.  Psychiatric: He has a normal mood and affect. His behavior is normal. Judgment and thought content normal.        Assessment & Plan:     1. Type 2 diabetes mellitus without complication, without long-term current use of insulin (HCC)  - POCT HgB A1C 7.0 today.   2. Essential hypertension   3. Hypercholesterolemia without hypertriglyceridemia   4. Need for influenza vaccination  - Flu vaccine HIGH DOSE PF   5. Need for pneumococcal vaccination  - Pneumococcal polysaccharide vaccine 23-valent greater than or equal to 2yo subcutaneous/IM 6.ED    HPI, Exam, and A&P Transcribed under the direction and in the presence of Richard L. Cranford Mon, MD  Electronically Signed: Katina Dung, CMA  I have done the exam and reviewed the above chart and it is accurate to the best of my knowledge. Development worker, community has been used in this note in any air is in the dictation or transcription are unintentional.  [name], MD  [place]

## 2016-12-31 ENCOUNTER — Ambulatory Visit (INDEPENDENT_AMBULATORY_CARE_PROVIDER_SITE_OTHER): Payer: Medicare Other | Admitting: Urology

## 2016-12-31 ENCOUNTER — Encounter: Payer: Self-pay | Admitting: Urology

## 2016-12-31 VITALS — BP 159/63 | HR 97 | Ht 68.0 in | Wt 190.0 lb

## 2016-12-31 DIAGNOSIS — C61 Malignant neoplasm of prostate: Secondary | ICD-10-CM | POA: Diagnosis not present

## 2016-12-31 NOTE — Progress Notes (Signed)
12/31/2016 9:46 AM   Michael Murphy 10-21-1927 [id number]  Referring provider: Jerrol Banana., MD [address] [place], [place] [address]  Chief Complaint  Patient presents with  . Prostate Cancer    1year    HPI: 83 y.o. presents for prostate cancer follow-up.  He is status post radical prostatectomy by Dr. Quillian Quince in 2003.  Prior records were not available in the stage/grade of his cancer are unknown.  PSA the last several years has been in the 0.7-0.9 range.  He has stable nocturia x3-4 which is not bothersome.  Denies dysuria or gross hematuria.  Denies flank, abdominal, pelvic or scrotal pain.  He has erectile dysfunction but does not desire treatment.  He has been given Cialis in the past but was not comfortable trying.  [name] was 0.96.   PMH: Past Medical History:  Diagnosis Date  . Adiposity 07/12/2014  . Allergic rhinitis 02/11/2007  . Arthritis, degenerative 02/11/2007  . Benign essential HTN 02/11/2007   Goal BP< 130/80 due DMII.   Marland Kitchen CA of prostate ([place]) 07/12/2014  . Carpal tunnel syndrome 07/12/2014  . Diabetes mellitus without complication ([place])   . Diabetic neuropathy ([place]) 02/12/2009   s/p Lifestyle Education Diabetic Classes 2012.  Monofilament intact 09/18/2013-normal bilaterally.   . ED (erectile dysfunction) of organic origin 01/23/2013  . Epididymitis 07/12/2014  . Excessive urination at night 01/23/2013  . Hypercholesterolemia without hypertriglyceridemia 02/11/2007  . Hypertension   . Neurosis, posttraumatic 07/12/2014   ([facility]) and ([place]).   . OP (osteoporosis) 02/11/2007    Surgical History: Past Surgical History:  Procedure Laterality Date  . CATARACT EXTRACTION    . KNEE ARTHROSCOPY     left  . PROSTATECTOMY    . ROTATOR CUFF REPAIR      Home Medications:  Allergies as of 12/31/2016      Reactions   Accupril  [quinapril Hcl] Swelling   throat swells   Quinapril Swelling      Medication List         Accurate as of 12/31/16  9:46 AM. Always use your most recent med list.          alendronate 70 MG tablet Commonly known as:  FOSAMAX TAKE 1 TABLET ONCE EACH WEEK. TAKE WITH A FULL GLASS OF WATER ON AN EMPTY STOMACH   amLODipine 10 MG tablet Commonly known as:  NORVASC Take 1 tablet by mouth daily.   aspirin 81 MG chewable tablet Chew by mouth daily.   budesonide 180 MCG/ACT inhaler Commonly known as:  PULMICORT Inhale 2 puffs into the lungs 2 (two) times daily.   Fish Oil 1000 MG Caps Take 1 capsule by mouth daily.   FLAXSEED (LINSEED) PO Take 2 capsules by mouth daily.   Garlic Oil 1914 MG Caps Take 1 capsule by mouth daily.   MENS MULTIVITAMIN PLUS PO Take by mouth daily.   metFORMIN 500 MG tablet Commonly known as:  GLUCOPHAGE Take 1 tablet by mouth daily.   simvastatin 20 MG tablet Commonly known as:  ZOCOR Take 1 tablet by mouth at bedtime.   terazosin 10 MG capsule Commonly known as:  HYTRIN       Allergies:  Allergies  Allergen Reactions  . Accupril  [Quinapril Hcl] Swelling    throat swells  . Quinapril Swelling    Family History: Family History  Problem Relation Age of Onset  . Hypertension Mother   . Stroke Mother   .  Diabetes Sister   . Hypertension Sister   . Diabetes Brother   . Diabetes Brother     Social History:  reports that he quit smoking about 25 years ago. His smoking use included Cigarettes. He has a 9.00 pack-year smoking history. He has never used smokeless tobacco. He reports that he drinks about 3.0 oz of alcohol per week . He reports that he does not use drugs.  ROS: UROLOGY Frequent Urination?: Yes Hard to postpone urination?: No Burning/pain with urination?: No Get up at night to urinate?: Yes Leakage of urine?: Yes Urine stream starts and stops?: No Trouble starting stream?: No Do you have to strain to urinate?: No Blood in urine?: No Urinary tract infection?: No Sexually transmitted disease?: No Injury  to kidneys or bladder?: No Painful intercourse?: No Weak stream?: No Erection problems?: No Penile pain?: No  Gastrointestinal Nausea?: No Vomiting?: No Indigestion/heartburn?: No Diarrhea?: No Constipation?: No  Constitutional Fever: No Night sweats?: No Weight loss?: No Fatigue?: No  Skin Skin rash/lesions?: No Itching?: No  Eyes Blurred vision?: No Double vision?: No  Ears/Nose/Throat Sore throat?: No Sinus problems?: No  Hematologic/Lymphatic Swollen glands?: No Easy bruising?: No  Cardiovascular Leg swelling?: No Chest pain?: No  Respiratory Cough?: No Shortness of breath?: No  Endocrine Excessive thirst?: No  Musculoskeletal Back pain?: No Joint pain?: No  Neurological Headaches?: No Dizziness?: No  Psychologic Depression?: No Anxiety?: No  Physical Exam: BP (!) 159/63   Pulse 97   Ht 5\' 8"  (1.727 m)   Wt 190 lb (86.2 kg)   BMI 28.89 kg/m   Constitutional:  Alert and oriented, No acute distress. HEENT: [place] AT, moist mucus membranes.  Trachea midline, no masses. Cardiovascular: No clubbing, cyanosis, or edema. Respiratory: Normal respiratory effort, no increased work of breathing. GI: Abdomen is soft, nontender, nondistended, no abdominal masses GU: No CVA tenderness.  Skin: No rashes, bruises or suspicious lesions. Lymph: No cervical or inguinal adenopathy. Neurologic: Grossly intact, no focal deficits, moving all 4 extremities. Psychiatric: Normal mood and affect.  Laboratory Data: Lab Results  Component Value Date   WBC 7.4 08/05/2016   HGB 14.1 08/05/2016   HCT 42.8 08/05/2016   MCV 85 08/05/2016   PLT 204 08/05/2016    Lab Results  Component Value Date   CREATININE 0.94 08/05/2016    Lab Results  Component Value Date   PSA1 0.9 08/05/2016    Lab Results  Component Value Date   HGBA1C 7.0 12/01/2016     Assessment & Plan:    1.  Personal history prostate cancer  PSA drawn today and if stable he will  continue annual follow-up.   - PSA   Return in about 1 year (around 12/31/2017) for Recheck, PSA.     Abbie Sons, [place] [address], [place], [place] [address] [phone]

## 2017-01-01 ENCOUNTER — Telehealth: Payer: Self-pay | Admitting: *Deleted

## 2017-01-01 LAB — PSA: Prostate Specific Ag, Serum: 1 ng/mL (ref 0.0–4.0)

## 2017-01-01 NOTE — Telephone Encounter (Signed)
LMOM for patient to call office back about labs. 

## 2017-01-01 NOTE — Telephone Encounter (Signed)
Spoke with patient no further questions   Michael Murphy

## 2017-01-01 NOTE — Telephone Encounter (Signed)
-----   Message from Abbie Sons, MD sent at 01/01/2017  7:51 AM EDT ----- PSA stable at 1.0

## 2017-02-01 DIAGNOSIS — E119 Type 2 diabetes mellitus without complications: Secondary | ICD-10-CM | POA: Diagnosis not present

## 2017-02-01 DIAGNOSIS — B351 Tinea unguium: Secondary | ICD-10-CM | POA: Diagnosis not present

## 2017-03-04 ENCOUNTER — Telehealth: Payer: Self-pay | Admitting: Family Medicine

## 2017-03-04 MED ORDER — AZITHROMYCIN 250 MG PO TABS: ORAL_TABLET | ORAL | 0 refills | Status: DC

## 2017-03-04 NOTE — Telephone Encounter (Signed)
Son advised and the patient was also on the phone. RX sent in for [employer], RMA

## 2017-03-04 NOTE — Telephone Encounter (Signed)
Pt requesting an antibiotic be sent to [facility].  States he has the bug; cough and aching.  Suggested for him to make an appt and he stated he wanted a message sent back so he could have the medication sent in; he didn't want to make an appt.

## 2017-03-04 NOTE — Telephone Encounter (Signed)
Please advise. Appointment?-Anastasiya Estell Harpin, RMA

## 2017-03-04 NOTE — Telephone Encounter (Signed)
Zpak/OTC robitussin DM

## 2017-03-09 ENCOUNTER — Other Ambulatory Visit: Payer: Self-pay

## 2017-03-09 ENCOUNTER — Encounter: Payer: Self-pay | Admitting: Emergency Medicine

## 2017-03-09 ENCOUNTER — Emergency Department
Admission: EM | Admit: 2017-03-09 | Discharge: 2017-03-09 | Disposition: A | Discharge status: Home / Self Care | Payer: Medicare Other | Attending: Emergency Medicine | Admitting: Emergency Medicine

## 2017-03-09 ENCOUNTER — Emergency Department: Payer: Medicare Other

## 2017-03-09 DIAGNOSIS — R0602 Shortness of breath: Secondary | ICD-10-CM

## 2017-03-09 DIAGNOSIS — J40 Bronchitis, not specified as acute or chronic: Secondary | ICD-10-CM | POA: Insufficient documentation

## 2017-03-09 DIAGNOSIS — R05 Cough: Secondary | ICD-10-CM | POA: Diagnosis not present

## 2017-03-09 DIAGNOSIS — E119 Type 2 diabetes mellitus without complications: Secondary | ICD-10-CM | POA: Insufficient documentation

## 2017-03-09 DIAGNOSIS — I10 Essential (primary) hypertension: Secondary | ICD-10-CM | POA: Insufficient documentation

## 2017-03-09 DIAGNOSIS — Z7982 Long term (current) use of aspirin: Secondary | ICD-10-CM | POA: Diagnosis not present

## 2017-03-09 DIAGNOSIS — R059 Cough, unspecified: Secondary | ICD-10-CM

## 2017-03-09 DIAGNOSIS — Z79899 Other long term (current) drug therapy: Secondary | ICD-10-CM | POA: Diagnosis not present

## 2017-03-09 DIAGNOSIS — Z87891 Personal history of nicotine dependence: Secondary | ICD-10-CM | POA: Insufficient documentation

## 2017-03-09 DIAGNOSIS — Z7984 Long term (current) use of oral hypoglycemic drugs: Secondary | ICD-10-CM | POA: Insufficient documentation

## 2017-03-09 LAB — CBC
HCT: 36.5 % — ABNORMAL LOW (ref 40.0–52.0)
Hemoglobin: 12 g/dL — ABNORMAL LOW (ref 13.0–18.0)
MCH: 28 pg (ref 26.0–34.0)
MCHC: 33 g/dL (ref 32.0–36.0)
MCV: 84.8 fL (ref 80.0–100.0)
Platelets: 135 10*3/uL — ABNORMAL LOW (ref 150–440)
RBC: 4.3 MIL/uL — ABNORMAL LOW (ref 4.40–5.90)
RDW: 14.5 % (ref 11.5–14.5)
WBC: 5.9 10*3/uL (ref 3.8–10.6)

## 2017-03-09 LAB — BASIC METABOLIC PANEL
Anion gap: 9 (ref 5–15)
BUN: 8 mg/dL (ref 6–20)
CO2: 24 mmol/L (ref 22–32)
Calcium: 8.9 mg/dL (ref 8.9–10.3)
Chloride: 105 mmol/L (ref 101–111)
Creatinine, Ser: 0.83 mg/dL (ref 0.61–1.24)
GFR calc Af Amer: >60 mL/min (ref >60)
GFR calc non Af Amer: >60 mL/min (ref >60)
Glucose, Bld: 113 mg/dL — ABNORMAL HIGH (ref 65–99)
Potassium: 3.6 mmol/L (ref 3.5–5.1)
Sodium: 138 mmol/L (ref 135–145)

## 2017-03-09 LAB — TROPONIN I
Troponin I: <0.03 ng/mL (ref <0.03)
Troponin I: <0.03 ng/mL (ref <0.03)

## 2017-03-09 MED ORDER — IPRATROPIUM-ALBUTEROL 0.5-2.5 (3) MG/3ML IN SOLN
3.0000 mL | Freq: Once | RESPIRATORY_TRACT | Status: AC
  Administered 2017-03-09: 3 mL via RESPIRATORY_TRACT
  Filled 2017-03-09: qty 3

## 2017-03-09 MED ORDER — ALBUTEROL SULFATE HFA 108 (90 BASE) MCG/ACT IN AERS: 2.0000 | INHALATION_SPRAY | Freq: Four times a day (QID) | RESPIRATORY_TRACT | 0 refills | Status: DC | PRN

## 2017-03-09 MED ORDER — METHYLPREDNISOLONE SODIUM SUCC 125 MG IJ SOLR
125.0000 mg | Freq: Once | INTRAMUSCULAR | Status: AC
  Administered 2017-03-09: 125 mg via INTRAVENOUS
  Filled 2017-03-09: qty 2

## 2017-03-09 MED ORDER — PREDNISONE 20 MG PO TABS: 40.0000 mg | ORAL_TABLET | Freq: Every day | ORAL | 0 refills | Status: DC

## 2017-03-09 MED ORDER — MAGNESIUM SULFATE 2 GM/50ML IV SOLN
2.0000 g | Freq: Once | INTRAVENOUS | Status: AC
  Administered 2017-03-09: 2 g via INTRAVENOUS
  Filled 2017-03-09: qty 50

## 2017-03-09 NOTE — ED Provider Notes (Signed)
[facility] Emergency Department Provider Note   ____________________________________________   First MD Initiated Contact with Patient 03/09/17 0601     (approximate)  I have reviewed the triage vital signs and the nursing notes.   HISTORY  Chief Complaint Shortness of Breath    HPI Michael Murphy is a 84 y.o. male who comes into the hospital today with some shortness of breath.  He reports that it started a few months ago but he contracted a virus that he thinks has made it worse.  The patient has had a cough since about December 25 that has made him more short of breath.  The patient states that his primary care physician prescribed him a Z-Pak as he had a fever as well.  He finished it today but was still feeling short of breath.  He is bringing up some white appearing sputum.  He has some mild chest pain with his cough.  He has not had any sick contacts or fevers aside from that day.  He denies chills but has some mild achiness in his chest.  The patient is here today for evaluation of his symptoms.  Past Medical History:  Diagnosis Date  . Adiposity 07/12/2014  . Allergic rhinitis 02/11/2007  . Arthritis, degenerative 02/11/2007  . Benign essential HTN 02/11/2007   Goal BP< 130/80 due DMII.   Marland Kitchen CA of prostate ([place]) 07/12/2014  . Carpal tunnel syndrome 07/12/2014  . Diabetes mellitus without complication ([place])   . Diabetic neuropathy ([place]) 02/12/2009   s/p Lifestyle Education Diabetic Classes 2012.  Monofilament intact 09/18/2013-normal bilaterally.   . ED (erectile dysfunction) of organic origin 01/23/2013  . Epididymitis 07/12/2014  . Excessive urination at night 01/23/2013  . Hypercholesterolemia without hypertriglyceridemia 02/11/2007  . Hypertension   . Neurosis, posttraumatic 07/12/2014   ([place] Vet) and ([place]).   . OP (osteoporosis) 02/11/2007    Patient Active Problem List   Diagnosis Date Noted  . Abnormal laboratory test 07/12/2014  . Carpal  tunnel syndrome 07/12/2014  . Epididymitis 07/12/2014  . Post-operative state 07/12/2014  . Adiposity 07/12/2014  . Neurosis, posttraumatic 07/12/2014  . CA of prostate ([place]) 07/12/2014  . Excessive urination at night 01/23/2013  . ED (erectile dysfunction) of organic origin 01/23/2013  . Malignant neoplasm of prostate ([place]) 12/07/2011  . Personal history of prostate cancer 12/07/2011  . Diabetic neuropathy ([place]) 02/12/2009  . Allergic rhinitis 02/11/2007  . Benign essential HTN 02/11/2007  . Arthritis, degenerative 02/11/2007  . OP (osteoporosis) 02/11/2007  . Hypercholesterolemia without hypertriglyceridemia 02/11/2007  . Current tobacco use 02/11/2007    Past Surgical History:  Procedure Laterality Date  . CATARACT EXTRACTION    . KNEE ARTHROSCOPY     left  . PROSTATECTOMY    . ROTATOR CUFF REPAIR      Prior to Admission medications   Medication Sig Start Date End Date Taking? Authorizing Provider  alendronate (FOSAMAX) 70 MG tablet TAKE 1 TABLET ONCE EACH WEEK. TAKE WITH A FULL GLASS OF WATER ON AN EMPTY STOMACH 06/09/16   Jerrol Banana., MD  amLODipine (NORVASC) 10 MG tablet Take 1 tablet by mouth daily. 10/15/10   [provider]  aspirin 81 MG chewable tablet Chew by mouth daily.    [provider]  azithromycin (ZITHROMAX) 250 MG tablet As directed 03/04/17   Jerrol Banana., MD  budesonide (PULMICORT) 180 MCG/ACT inhaler Inhale 2 puffs into the lungs 2 (two) times daily. 12/21/14  Chrismon, Dennis E, PA  FLAXSEED, LINSEED, PO Take 2 capsules by mouth daily. 02/25/10   [provider]  Garlic Oil 5956 MG CAPS Take 1 capsule by mouth daily. 04/14/07   [provider]  metFORMIN (GLUCOPHAGE) 500 MG tablet Take 1 tablet by mouth daily.    [provider]  Multiple Vitamins-Minerals (MENS MULTIVITAMIN PLUS PO) Take by mouth daily.    [provider]  Omega-3 Fatty Acids (FISH OIL) 1000 MG CAPS Take 1 capsule by  mouth daily. 04/14/07   [provider]  simvastatin (ZOCOR) 20 MG tablet Take 1 tablet by mouth at bedtime.    [provider]  terazosin (HYTRIN) 10 MG capsule  10/22/16   [provider]    Allergies Accupril  [quinapril hcl] and Quinapril  Family History  Problem Relation Age of Onset  . Hypertension Mother   . Stroke Mother   . Diabetes Sister   . Hypertension Sister   . Diabetes Brother   . Diabetes Brother     Social History Social History   Tobacco Use  . Smoking status: Former Smoker    Packs/day: 0.50    Years: 18.00    Pack years: 9.00    Types: Cigarettes    Last attempt to quit: 03/09/1991    Years since quitting: 26.0  . Smokeless tobacco: Never Used  Substance Use Topics  . Alcohol use: Yes    Alcohol/week: 3.0 oz    Types: 2 Standard drinks or equivalent, 3 Cans of beer per week  . Drug use: No    Review of Systems  Constitutional: No fever/chills Eyes: No visual changes. ENT: No sore throat. Cardiovascular:  chest pain. Respiratory: Cough and shortness of breath. Gastrointestinal: No abdominal pain.  No nausea, no vomiting.  No diarrhea.  No constipation. Genitourinary: Negative for dysuria. Musculoskeletal: Negative for back pain. Skin: Negative for rash. Neurological: Negative for headaches, focal weakness or numbness.   ____________________________________________   PHYSICAL EXAM:  VITAL SIGNS: ED Triage Vitals  Enc Vitals Group     BP 03/09/17 0630 (!) 142/74     Pulse Rate 03/09/17 0630 68     Resp --      Temp --      Temp src --      SpO2 03/09/17 0630 96 %     Weight 03/09/17 0547 200 lb (90.7 kg)     Height 03/09/17 0547 5\' 8"  (1.727 m)     Head Circumference --      Peak Flow --      Pain Score --      Pain Loc --      Pain Edu? --      Excl. in [place]? --     Constitutional: Alert and oriented. Well appearing and in mild respiratory distress. Eyes: Conjunctivae are normal. PERRL. EOMI. Head:  Atraumatic. Nose: No congestion/rhinnorhea. Mouth/Throat: Mucous membranes are moist.  Oropharynx non-erythematous. Cardiovascular: Normal rate, regular rhythm. Grossly normal heart sounds.  Good peripheral circulation. Respiratory: Normal respiratory effort.  No retractions.  Expiratory wheezes in all lung fields Gastrointestinal: Soft and nontender. No distention.  Musculoskeletal: No lower extremity tenderness nor edema.   Neurologic:  Normal speech and language.  Skin:  Skin is warm, dry and intact.  Psychiatric: Mood and affect are normal.   ____________________________________________   LABS (all labs ordered are listed, but only abnormal results are displayed)  Labs Reviewed  CBC - Abnormal; Notable for the following components:  Result Value   RBC 4.30 (*)    Hemoglobin 12.0 (*)    HCT 36.5 (*)    Platelets 135 (*)    All other components within normal limits  BASIC METABOLIC PANEL - Abnormal; Notable for the following components:   Glucose, Bld 113 (*)    All other components within normal limits  TROPONIN I  TROPONIN I   ____________________________________________  EKG  ED ECG REPORT I, Loney Hering, the attending physician, personally viewed and interpreted this ECG.   Date: 03/09/2017  EKG Time: 644  Rate: 62  Rhythm: normal sinus rhythm  Axis: normal  Intervals:none  ST&T Change: none  ____________________________________________  RADIOLOGY  Dg Chest Portable 1 View  Result Date: 03/09/2017 CLINICAL DATA:  84 y/o  M; shortness of breath. EXAM: PORTABLE CHEST 1 VIEW COMPARISON:  10/30/2016 chest radiograph FINDINGS: Stable normal cardiac silhouette given projection and technique. Aortic atherosclerosis with calcification. Clear lungs. No pleural effusion or pneumothorax. No acute osseous abnormality is evident. IMPRESSION: No active disease. Electronically Signed   By: Kristine Garbe M.D.   On: 03/09/2017 06:12     ____________________________________________   PROCEDURES  Procedure(s) performed: None  Procedures  Critical Care performed: No  ____________________________________________   INITIAL IMPRESSION / ASSESSMENT AND PLAN / ED COURSE  As part of my medical decision making, I reviewed the following data within the electronic MEDICAL RECORD NUMBER Notes from prior ED visits and [place] Controlled Substance Database   This is an 84 year old male who comes into the hospital today with some shortness of breath.  He has had a cough with a fever on Christmas day and some persistent cough.  Although the patient has completed a Z-Pak he still feeling short of breath.  My differential diagnosis includes bronchitis, pneumonia, COPD.  The patient does have some wheezing on x-ray so I will give him 2 DuoNeb treatments and some Solu-Medrol.  I will check some blood work on the patient.  We did do a chest x-ray which did not show any pneumonia.  Once he is received his medications he will be reassessed and his disposition will be determined.   Clinical Course as of Mar 09 824  Tue Mar 09, 2017  0824 I did going to reassess the patient.  He still has some wheezing so I will give him some more duo nebs and albuterol.  He will also receive a dose of magnesium sulfate.  I will sign his care out to Dr. Kerman Passey who will reassess the patient and determine his disposition.  [AW]    Clinical Course User Index [AW] Loney Hering, MD    ____________________________________________   FINAL CLINICAL IMPRESSION(S) / ED DIAGNOSES  Final diagnoses:  Bronchitis  Shortness of breath  Cough     ED Discharge Orders    None       Note:  This document was prepared using Dragon voice recognition software and may include unintentional dictation errors.    Loney Hering, MD 03/09/17 [phone]

## 2017-03-09 NOTE — ED Provider Notes (Signed)
-----------------------------------------   10:44 AM on 03/09/2017 -----------------------------------------  Patient appears very well.  States he feels much better than when he came in.  Clear lung sounds during my examination patient taken off of supplemental oxygen, after several minutes continues to have a room air saturation between 94 and 96%.  I discussed the options with the patient, and he is agreeable to a trial of going home with steroids and an albuterol inhaler.  I discussed return precautions for any worsening trouble breathing chest pain or development of significant fever.  Patient agreeable to this plan of care.   Harvest Dark, MD 03/09/17 1046

## 2017-03-09 NOTE — Discharge Instructions (Signed)
As we discussed please use your inhaler every 4-6 hours as needed for shortness of breath.  Please take your steroids each day for the next 5 days as prescribed.  Please follow-up with your doctor as soon as possible for recheck/reevaluation.  Return to the emergency department for any difficulty breathing chest pain, or any other symptom personally concerning to yourself.

## 2017-03-09 NOTE — ED Triage Notes (Signed)
Pt to triage via w/c with no distress noted; reports SHOB last several months, worse last 2 days with prod cough white sputum

## 2017-03-09 NOTE — ED Notes (Signed)
Pt 88% RA. Pt placed on 2L of O2.

## 2017-03-11 ENCOUNTER — Ambulatory Visit (INDEPENDENT_AMBULATORY_CARE_PROVIDER_SITE_OTHER): Payer: Medicare Other | Admitting: Family Medicine

## 2017-03-11 VITALS — BP 150/72 | HR 88 | Temp 98.4°F | Resp 18 | Wt 213.0 lb

## 2017-03-11 DIAGNOSIS — I10 Essential (primary) hypertension: Secondary | ICD-10-CM | POA: Diagnosis not present

## 2017-03-11 DIAGNOSIS — E1149 Type 2 diabetes mellitus with other diabetic neurological complication: Secondary | ICD-10-CM

## 2017-03-11 MED ORDER — ALBUTEROL SULFATE HFA 108 (90 BASE) MCG/ACT IN AERS: 2.0000 | INHALATION_SPRAY | Freq: Four times a day (QID) | RESPIRATORY_TRACT | 12 refills | Status: AC | PRN

## 2017-03-11 NOTE — Progress Notes (Signed)
Michael Murphy  MRN: [id number] DOB: 10/05/1927  Subjective:  HPI   The patient is an 84 year old male who presents for follow up/transition of care after being seen in the emergency room on 03/09/17.   The patient presented to the hospital with shortness of breath.  He reported having cough for about 1 week that had been treated with a ZPak He completed the antibiotics and was not better and therefor presented to the ED.  The ED diagnosed the patient with Bronchitis, dyspnea and cough.  He was treated with Prednisone, Nebulizer x 2, Solu-Medrol, Prednisone, and Albuterol inhaler.  He had normal chest x-ray and labs were WNL other than slightly low RBC, Hgb anf Hct.  The patient reports that he is still somewhat short of breath but definitely improved.  He is still sleeping in the recliner but is not having any difficulty being able to go to sleep.  He is using the Albuterol inhaler every 6 hours and requests that we send in another for him.   Patient Active Problem List   Diagnosis Date Noted  . Abnormal laboratory test 07/12/2014  . Carpal tunnel syndrome 07/12/2014  . Epididymitis 07/12/2014  . Post-operative state 07/12/2014  . Adiposity 07/12/2014  . Neurosis, posttraumatic 07/12/2014  . CA of prostate ([place]) 07/12/2014  . Excessive urination at night 01/23/2013  . ED (erectile dysfunction) of organic origin 01/23/2013  . Malignant neoplasm of prostate ([place]) 12/07/2011  . Personal history of prostate cancer 12/07/2011  . Diabetic neuropathy ([place]) 02/12/2009  . Allergic rhinitis 02/11/2007  . Benign essential HTN 02/11/2007  . Arthritis, degenerative 02/11/2007  . OP (osteoporosis) 02/11/2007  . Hypercholesterolemia without hypertriglyceridemia 02/11/2007  . Current tobacco use 02/11/2007    Past Medical History:  Diagnosis Date  . Adiposity 07/12/2014  . Allergic rhinitis 02/11/2007  . Arthritis, degenerative 02/11/2007  . Benign essential HTN 02/11/2007   Goal BP< 130/80 due  DMII.   Marland Kitchen CA of prostate ([place]) 07/12/2014  . Carpal tunnel syndrome 07/12/2014  . Diabetes mellitus without complication ([place])   . Diabetic neuropathy ([place]) 02/12/2009   s/p Lifestyle Education Diabetic Classes 2012.  Monofilament intact 09/18/2013-normal bilaterally.   . ED (erectile dysfunction) of organic origin 01/23/2013  . Epididymitis 07/12/2014  . Excessive urination at night 01/23/2013  . Hypercholesterolemia without hypertriglyceridemia 02/11/2007  . Hypertension   . Neurosis, posttraumatic 07/12/2014   ([place] Vet) and ([place]).   . OP (osteoporosis) 02/11/2007    Social History   Socioeconomic History  . Marital status: Widowed    Spouse name: Not on file  . Number of children: Not on file  . Years of education: Not on file  . Highest education level: Not on file  Social Needs  . Financial resource strain: Not on file  . Food insecurity - worry: Not on file  . Food insecurity - inability: Not on file  . Transportation needs - medical: Not on file  . Transportation needs - non-medical: Not on file  Occupational History  . Not on file  Tobacco Use  . Smoking status: Former Smoker    Packs/day: 0.50    Years: 18.00    Pack years: 9.00    Types: Cigarettes    Last attempt to quit: 03/09/1991    Years since quitting: 26.0  . Smokeless tobacco: Never Used  Substance and Sexual Activity  . Alcohol use: Yes    Alcohol/week: 3.0 oz    Types: 2 Standard drinks or  equivalent, 3 Cans of beer per week  . Drug use: No  . Sexual activity: Not on file  Other Topics Concern  . Not on file  Social History Narrative  . Not on file    Outpatient Encounter Medications as of 03/11/2017  Medication Sig Note  . albuterol (PROVENTIL HFA;VENTOLIN HFA) 108 (90 Base) MCG/ACT inhaler Inhale 2 puffs into the lungs every 6 (six) hours as needed for wheezing or shortness of breath.   Marland Kitchen alendronate (FOSAMAX) 70 MG tablet TAKE 1 TABLET ONCE EACH WEEK. TAKE WITH A FULL GLASS OF WATER ON AN  EMPTY STOMACH   . amLODipine (NORVASC) 10 MG tablet Take 1 tablet by mouth daily. 07/12/2014: DX: 401.1 Received from: [place]:   . aspirin 81 MG chewable tablet Chew by mouth daily.   Marland Kitchen FLAXSEED, LINSEED, PO Take 2 capsules by mouth daily. 07/12/2014: Received from: [place]:   . Garlic Oil 2440 MG CAPS Take 1 capsule by mouth daily. 07/12/2014: Received from: [place]: GARLIC OIL, 1000MG  (Oral Capsule)  1 Every Day for 0 days  Quantity: 0.00;  Refills: 0   Ordered :25-Feb-2010  Doy Hutching ;  Started 14-Apr-2007 Active  . metFORMIN (GLUCOPHAGE) 500 MG tablet Take 1 tablet by mouth daily. 07/12/2014: Received from: [place]:   . Multiple Vitamins-Minerals (MENS MULTIVITAMIN PLUS PO) Take by mouth daily.   . Omega-3 Fatty Acids (FISH OIL) 1000 MG CAPS Take 1 capsule by mouth daily. 07/12/2014: Received from: [place]: FISH OIL CONCENTRATE, 1000MG  (Oral Capsule)  1 Three Times Daily for 0 days  Quantity: 0.00;  Refills: 0   Ordered :25-Feb-2010  Doy Hutching ;  Started 14-Apr-2007 Active  . predniSONE (DELTASONE) 20 MG tablet Take 2 tablets (40 mg total) by mouth daily.   . simvastatin (ZOCOR) 20 MG tablet Take 1 tablet by mouth at bedtime. 07/12/2014: Received from: [place]:   . terazosin (HYTRIN) 10 MG capsule    . [DISCONTINUED] azithromycin (ZITHROMAX) 250 MG tablet As directed   . [DISCONTINUED] budesonide (PULMICORT) 180 MCG/ACT inhaler Inhale 2 puffs into the lungs 2 (two) times daily.    No facility-administered encounter medications on file as of 03/11/2017.     Allergies  Allergen Reactions  . Accupril  [Quinapril Hcl] Swelling    throat swells  . Quinapril Swelling    Review of Systems  Constitutional: Positive for malaise/fatigue. Negative for chills and fever.  Eyes: Negative.     Respiratory: Positive for cough, sputum production, shortness of breath and wheezing. Negative for hemoptysis.   Cardiovascular: Positive for chest pain (possibly from coughing so much, discomfort when breathing in deep.) and orthopnea. Negative for palpitations, claudication and leg swelling.  Gastrointestinal: Negative.   Musculoskeletal: Positive for joint pain.  Skin: Negative.   Neurological: Negative for dizziness, weakness and headaches.  Endo/Heme/Allergies: Negative.   Psychiatric/Behavioral: Negative.     Objective:  BP (!) 150/72 (BP Location: Right Arm, Patient Position: Sitting, Cuff Size: Normal)   Pulse 88   Temp 98.4 F (36.9 C) (Oral)   Resp 18   Wt 213 lb (96.6 kg)   SpO2 95%   BMI 32.39 kg/m   Physical Exam  Constitutional: He is oriented to person, place, and time and well-developed, well-nourished, and in no distress.  HENT:  Head: Normocephalic and atraumatic.  Right Ear: External ear normal.  Left Ear: External ear  normal.  Nose: Nose normal.  Mouth/Throat: Oropharynx is clear and moist.  Eyes: Conjunctivae are normal.  Neck: No thyromegaly present.  Cardiovascular: Normal rate, regular rhythm and normal heart sounds.  Pulmonary/Chest: Effort normal and breath sounds normal.  Abdominal: Soft.  Lymphadenopathy:    He has no cervical adenopathy.  Neurological: He is alert and oriented to person, place, and time. Gait normal. GCS score is 15.  Skin: Skin is warm and dry.  Psychiatric: Mood, memory, affect and judgment normal.    Assessment and Plan :  Chronic Bronchitis Improving. HTN RTC 1-2 months. TIIDM  I have done the exam and reviewed the chart and it is accurate to the best of my knowledge. Development worker, community has been used and  any errors in dictation or transcription are unintentional. Miguel Aschoff M.D. [facility]

## 2017-03-20 LAB — BLOOD GAS, VENOUS
Patient temperature: 37
pCO2, Ven: 49 mmHg (ref 44.0–60.0)
pH, Ven: 7.38 (ref 7.250–7.430)

## 2017-05-04 ENCOUNTER — Ambulatory Visit (INDEPENDENT_AMBULATORY_CARE_PROVIDER_SITE_OTHER): Payer: Medicare Other | Admitting: Family Medicine

## 2017-05-04 ENCOUNTER — Encounter: Payer: Self-pay | Admitting: Family Medicine

## 2017-05-04 VITALS — BP 140/60 | HR 82 | Temp 97.6°F | Resp 16 | Wt 215.0 lb

## 2017-05-04 DIAGNOSIS — B351 Tinea unguium: Secondary | ICD-10-CM | POA: Diagnosis not present

## 2017-05-04 DIAGNOSIS — E119 Type 2 diabetes mellitus without complications: Secondary | ICD-10-CM

## 2017-05-04 DIAGNOSIS — I10 Essential (primary) hypertension: Secondary | ICD-10-CM | POA: Diagnosis not present

## 2017-05-04 LAB — POCT GLYCOSYLATED HEMOGLOBIN (HGB A1C): Hemoglobin A1C: 6.6

## 2017-05-04 NOTE — Progress Notes (Signed)
Patient: Michael Murphy Male    DOB: [birth year]   84 y.o.   MRN: [id number] Visit Date: 05/04/2017  Today's Provider: [name], MD   Chief Complaint  Patient presents with  . Diabetes  . Hypertension   Subjective:    HPI  Diabetes Mellitus Type II, Follow-up:   Lab Results  Component Value Date   HGBA1C 7.0 12/01/2016   HGBA1C 6.7 08/04/2016   HGBA1C 6.4 05/02/2015    Last seen for diabetes 4 months ago.  Management since then includes none. He reports good compliance with treatment. He is not having side effects.  Home blood sugar records: checks his blood sugar once a week. Running in the 130's  Episodes of hypoglycemia? no   Current Insulin Regimen: n/a Most Recent Eye Exam: Pt will get eye exam at [facility] next month Current exercise: none  Pertinent Labs:    Component Value Date/Time   CHOL 185 08/05/2016 0916   TRIG 66 08/05/2016 0916   HDL 68 08/05/2016 0916   LDLCALC 104 (H) 08/05/2016 0916   CREATININE 0.83 03/09/2017 0642    Wt Readings from Last 3 Encounters:  05/04/17 215 lb (97.5 kg)  03/11/17 213 lb (96.6 kg)  03/09/17 200 lb (90.7 kg)    ------------------------------------------------------------------------  Hypertension, follow-up:  BP Readings from Last 3 Encounters:  05/04/17 140/60  03/11/17 (!) 150/72  03/09/17 (!) 162/77    He was last seen for hypertension 5 months ago.  BP at that visit was 150/72. Management since that visit includes none. He reports good compliance with treatment. He is not having side effects.  He is not exercising. He is adherent to low salt diet.   Outside blood pressures are not being checked. Wt Readings from Last 3 Encounters:  05/04/17 215 lb (97.5 kg)  03/11/17 213 lb (96.6 kg)  03/09/17 200 lb (90.7 kg)   ------------------------------------------------------------------------     Allergies  Allergen Reactions  . Accupril  [Quinapril Hcl] Swelling    throat swells  .  Quinapril Swelling     Current Outpatient Medications:  .  albuterol (PROVENTIL HFA;VENTOLIN HFA) 108 (90 Base) MCG/ACT inhaler, Inhale 2 puffs into the lungs every 6 (six) hours as needed for wheezing or shortness of breath., Disp: 1 Inhaler, Rfl: 12 .  alendronate (FOSAMAX) 70 MG tablet, TAKE 1 TABLET ONCE EACH WEEK. TAKE WITH A FULL GLASS OF WATER ON AN EMPTY STOMACH, Disp: 12 tablet, Rfl: 3 .  amLODipine (NORVASC) 10 MG tablet, Take 1 tablet by mouth daily., Disp: , Rfl:  .  aspirin 81 MG chewable tablet, Chew by mouth daily., Disp: , Rfl:  .  FLAXSEED, LINSEED, PO, Take 2 capsules by mouth daily., Disp: , Rfl:  .  Garlic Oil 1275 MG CAPS, Take 1 capsule by mouth daily., Disp: , Rfl:  .  metFORMIN (GLUCOPHAGE) 500 MG tablet, Take 1 tablet by mouth daily., Disp: , Rfl:  .  Multiple Vitamins-Minerals (MENS MULTIVITAMIN PLUS PO), Take by mouth daily., Disp: , Rfl:  .  Omega-3 Fatty Acids (FISH OIL) 1000 MG CAPS, Take 1 capsule by mouth daily., Disp: , Rfl:  .  simvastatin (ZOCOR) 20 MG tablet, Take 1 tablet by mouth at bedtime., Disp: , Rfl:  .  terazosin (HYTRIN) 10 MG capsule, , Disp: , Rfl:   Review of Systems  Constitutional: Negative.   HENT: Negative.   Eyes: Negative.   Respiratory: Positive for shortness of breath (nothing new for  pt. ).   Cardiovascular: Negative.   Gastrointestinal: Negative.   Endocrine: Negative.   Genitourinary: Negative.   Musculoskeletal: Positive for arthralgias.  Skin: Negative.   Allergic/Immunologic: Negative.   Neurological: Negative.   Hematological: Negative.   Psychiatric/Behavioral: Negative.     Social History   Tobacco Use  . Smoking status: Former Smoker    Packs/day: 0.50    Years: 18.00    Pack years: 9.00    Types: Cigarettes    Last attempt to quit: 03/09/1991    Years since quitting: 26.1  . Smokeless tobacco: Never Used  Substance Use Topics  . Alcohol use: Yes    Alcohol/week: 3.0 oz    Types: 2 Standard drinks or  equivalent, 3 Cans of beer per week   Objective:   BP 140/60 (BP Location: Left Arm, Patient Position: Sitting, Cuff Size: Large)   Pulse 82   Temp 97.6 F (36.4 C) (Oral)   Resp 16   Wt 215 lb (97.5 kg)   SpO2 97%   BMI 32.69 kg/m  Vitals:   05/04/17 1349  BP: 140/60  Pulse: 82  Resp: 16  Temp: 97.6 F (36.4 C)  TempSrc: Oral  SpO2: 97%  Weight: 215 lb (97.5 kg)     Physical Exam  Constitutional: He is oriented to person, place, and time. He appears well-developed and well-nourished.  HENT:  Head: Normocephalic and atraumatic.  Right Ear: External ear normal.  Nose: Nose normal.  Eyes: Conjunctivae are normal.  Neck: No thyromegaly present.  Cardiovascular: Normal rate, regular rhythm and normal heart sounds.  Pulmonary/Chest: Effort normal and breath sounds normal.  Musculoskeletal: He exhibits edema.  Trace edema.  Neurological: He is oriented to person, place, and time.  Skin: Skin is warm and dry.  Psychiatric: He has a normal mood and affect. His behavior is normal. Thought content normal.        Assessment & Plan:      1. Benign essential HTN   2. Type 2 diabetes mellitus without complication, without long-term current use of insulin (HCC)  - POCT HgB A1C--6.6 today.      I have done the exam and reviewed the above chart and it is accurate to the best of my knowledge. Development worker, community has been used in this note in any air is in the dictation or transcription are unintentional.  [name], MD  [place].

## 2017-05-04 NOTE — Patient Instructions (Signed)
We will order labs at your next office visit.  [facility]. Thanks! :)

## 2017-05-11 ENCOUNTER — Other Ambulatory Visit: Payer: Self-pay | Admitting: Family Medicine

## 2017-08-04 ENCOUNTER — Encounter: Payer: Self-pay | Admitting: Family Medicine

## 2017-08-19 DIAGNOSIS — B351 Tinea unguium: Secondary | ICD-10-CM | POA: Diagnosis not present

## 2017-08-19 DIAGNOSIS — E119 Type 2 diabetes mellitus without complications: Secondary | ICD-10-CM | POA: Diagnosis not present

## 2017-08-27 ENCOUNTER — Ambulatory Visit (INDEPENDENT_AMBULATORY_CARE_PROVIDER_SITE_OTHER): Payer: Medicare Other

## 2017-08-27 VITALS — BP 136/58 | HR 90 | Temp 98.3°F | Ht 68.0 in | Wt 203.2 lb

## 2017-08-27 DIAGNOSIS — Z0000 Encounter for general adult medical examination without abnormal findings: Secondary | ICD-10-CM

## 2017-08-27 NOTE — Progress Notes (Signed)
Subjective:   Michael Murphy is a 84 y.o. male who presents for Medicare Annual/Subsequent preventive examination.  Review of Systems:  N/A  Cardiac Risk Factors include: advanced age (>9men, >53 women);male gender;hypertension;diabetes mellitus;dyslipidemia;obesity (BMI >30kg/m2)     Objective:    Vitals: BP (!) 136/58 (BP Location: Right Arm)   Pulse 90   Temp 98.3 F (36.8 C) (Oral)   Ht 5\' 8"  (1.727 m)   Wt 203 lb 3.2 oz (92.2 kg)   BMI 30.90 kg/m   Body mass index is 30.9 kg/m.  Advanced Directives 08/27/2017 03/09/2017 08/04/2016 03/14/2016 02/04/2016 12/03/2015 10/08/2015  Does Patient Have a Medical Advance Directive? Yes No No No No No No  Type of Advance Directive Living will - - - - - -  Would patient like information on creating a medical advance directive? - No - Patient declined No - Patient declined No - Patient declined - - -    Tobacco Social History   Tobacco Use  Smoking Status Former Smoker  . Packs/day: 0.50  . Years: 18.00  . Pack years: 9.00  . Types: Cigarettes  . Last attempt to quit: 03/09/1991  . Years since quitting: 26.4  Smokeless Tobacco Never Used     Counseling given: Not Answered   Clinical Intake:  Pre-visit preparation completed: Yes  Pain : 0-10 Pain Score: 6  Pain Type: Chronic pain Pain Location: Back Pain Orientation: Lower Pain Descriptors / Indicators: Aching Pain Frequency: Intermittent     Nutritional Status: BMI > 30  Obese Nutritional Risks: None Diabetes: Yes(type 2) CBG done?: No Did pt. bring in CBG monitor from home?: No  How often do you need to have someone help you when you read instructions, pamphlets, or other written materials from your doctor or pharmacy?: 1 - Never  Interpreter Needed?: No  Information entered by :: [facility], LPN  Past Medical History:  Diagnosis Date  . Adiposity 07/12/2014  . Allergic rhinitis 02/11/2007  . Arthritis, degenerative 02/11/2007  . Benign essential HTN 02/11/2007   Goal BP< 130/80 due DMII.   Marland Kitchen CA of prostate ([place]) 07/12/2014  . Carpal tunnel syndrome 07/12/2014  . Diabetes mellitus without complication ([place])   . Diabetic neuropathy ([place]) 02/12/2009   s/p Lifestyle Education Diabetic Classes 2012.  Monofilament intact 09/18/2013-normal bilaterally.   . ED (erectile dysfunction) of organic origin 01/23/2013  . Epididymitis 07/12/2014  . Excessive urination at night 01/23/2013  . Hypercholesterolemia without hypertriglyceridemia 02/11/2007  . Hypertension   . Neurosis, posttraumatic 07/12/2014   ([place] [facility]) and ([place]).   . OP (osteoporosis) 02/11/2007   Past Surgical History:  Procedure Laterality Date  . CATARACT EXTRACTION    . KNEE ARTHROSCOPY     left  . PROSTATECTOMY    . ROTATOR CUFF REPAIR     Family History  Problem Relation Age of Onset  . Hypertension Mother   . Stroke Mother   . Diabetes Sister   . Hypertension Sister   . Diabetes Brother   . Diabetes Brother    Social History   Socioeconomic History  . Marital status: Widowed    Spouse name: Not on file  . Number of children: 8  . Years of education: Not on file  . Highest education level: GED or equivalent  Occupational History  . Occupation: retired  Scientific laboratory technician  . Financial resource strain: Not hard at all  . Food insecurity:    Worry: Never true    Inability: Never true  .  Transportation needs:    Medical: No    Non-medical: No  Tobacco Use  . Smoking status: Former Smoker    Packs/day: 0.50    Years: 18.00    Pack years: 9.00    Types: Cigarettes    Last attempt to quit: 03/09/1991    Years since quitting: 26.4  . Smokeless tobacco: Never Used  Substance and Sexual Activity  . Alcohol use: Yes    Alcohol/week: 1.2 - 1.8 oz    Types: 2 - 3 Cans of beer per week  . Drug use: No  . Sexual activity: Not on file  Lifestyle  . Physical activity:    Days per week: Not on file    Minutes per session: Not on file  . Stress: Only a little  Relationships    . Social connections:    Talks on phone: Not on file    Gets together: Not on file    Attends religious service: Not on file    Active member of club or organization: Not on file    Attends meetings of clubs or organizations: Not on file    Relationship status: Not on file  Other Topics Concern  . Not on file  Social History Narrative  . Not on file    Outpatient Encounter Medications as of 08/27/2017  Medication Sig  . albuterol (PROVENTIL HFA;VENTOLIN HFA) 108 (90 Base) MCG/ACT inhaler Inhale 2 puffs into the lungs every 6 (six) hours as needed for wheezing or shortness of breath.  Marland Kitchen alendronate (FOSAMAX) 70 MG tablet TAKE 1 TABLET ONCE EACH WEEK. TAKE WITH A FULL GLASS OF WATER ON AN EMPTY STOMACH  . amLODipine (NORVASC) 10 MG tablet Take 1 tablet by mouth daily.  Marland Kitchen aspirin 81 MG chewable tablet Chew by mouth daily.  . calcium carbonate (CALCIUM 600) 600 MG TABS tablet Take 600 mg by mouth daily.  Marland Kitchen FLAXSEED, LINSEED, PO Take 2 capsules by mouth daily.  . Garlic Oil 7564 MG CAPS Take 1 capsule by mouth daily.  . hydroxychloroquine (PLAQUENIL) 200 MG tablet Take 400 mg by mouth daily.   . metFORMIN (GLUCOPHAGE) 500 MG tablet Take 1 tablet by mouth daily.  . Multiple Vitamins-Minerals (MENS MULTIVITAMIN PLUS PO) Take by mouth daily.  . Omega-3 Fatty Acids (FISH OIL) 1000 MG CAPS Take 1 capsule by mouth daily.  . predniSONE (DELTASONE) 5 MG tablet Take 5 mg by mouth daily with breakfast.   . simvastatin (ZOCOR) 20 MG tablet Take 1 tablet by mouth at bedtime.  Marland Kitchen terazosin (HYTRIN) 10 MG capsule Take 10 mg by mouth at bedtime.   Marland Kitchen ULTRA FRESH 0.5 % SOLN 1 drop daily as needed.    No facility-administered encounter medications on file as of 08/27/2017.     Activities of Daily Living In your present state of health, do you have any difficulty performing the following activities: 08/27/2017  Hearing? N  Vision? N  Difficulty concentrating or making decisions? N  Walking or climbing  stairs? Y  Comment Due to SOB.  Dressing or bathing? N  Doing errands, shopping? N  Preparing Food and eating ? N  Using the Toilet? N  In the past six months, have you accidently leaked urine? N  Do you have problems with loss of bowel control? N  Managing your Medications? N  Managing your Finances? N  Housekeeping or managing your Housekeeping? N  Some recent data might be hidden    Patient Care Team: Jerrol Banana.,  MD as PCP - General (Family Medicine) Sharlotte Alamo, DPM as Consulting Physician (Podiatry) Lendon Collar, MD as Referring Physician (Family Medicine)   Assessment:   This is a routine wellness examination for Darryll.  Exercise Activities and Dietary recommendations Current Exercise Habits: Home exercise routine, Type of exercise: stretching;strength training/weights, Time (Minutes): 10(to 15 minutes), Frequency (Times/Week): 3, Weekly Exercise (Minutes/Week): 30, Intensity: Mild, Exercise limited by: None identified  Goals    . DIET - INCREASE WATER INTAKE     Recommend increasing water intake to 6 glasses a day.        Fall Risk Fall Risk  08/27/2017 08/04/2016 11/06/2015 08/22/2014  Falls in the past year? Yes No Yes No  Comment - - Emmi Telephone Survey: data to providers prior to load -  Number falls in past yr: 1 - 1 -  Comment - - Emmi Telephone Survey Actual Response = 1 -  Injury with Fall? No - Yes -  Follow up Falls prevention discussed - - -   Is the patient's home free of loose throw rugs in walkways, pet beds, electrical cords, etc?   yes      Grab bars in the bathroom? yes      Handrails on the stairs?   yes      Adequate lighting?   yes  Timed Get Up and Go Performed: N/A  Depression Screen PHQ 2/9 Scores 08/27/2017 08/04/2016 08/22/2014  PHQ - 2 Score 2 5 0  PHQ- 9 Score 6 18 -    Cognitive Function: Pt declined screening today.      6CIT Screen 08/04/2016  What Year? 0 points  What month? 0 points  What time? 0 points  Count  back from 20 0 points  Months in reverse 0 points  Repeat phrase 2 points  Total Score 2    Immunization History  Administered Date(s) Administered  . Influenza Split 11/24/2008, 12/03/2009, 12/26/2010  . Influenza, High Dose Seasonal PF 12/03/2015, 12/01/2016  . Influenza-Unspecified 12/08/2014  . Pneumococcal Conjugate-13 08/22/2014  . Pneumococcal Polysaccharide-23 12/01/2016  . Tdap 12/26/2010    Qualifies for Shingles Vaccine? Due for Shingles vaccine. Declined my offer to administer today. Education has been provided regarding the importance of this vaccine. Pt has been advised to call her insurance company to determine her out of pocket expense. Advised she may also receive this vaccine at her local pharmacy or Health Dept. Verbalized acceptance and understanding.  Screening Tests Health Maintenance  Topic Date Due  . FOOT EXAM  02/18/1938  . OPHTHALMOLOGY EXAM  09/06/2016  . URINE MICROALBUMIN  08/04/2017  . INFLUENZA VACCINE  10/07/2017  . HEMOGLOBIN A1C  11/01/2017  . TETANUS/TDAP  12/25/2020  . PNA vac Low Risk Adult  Completed   Cancer Screenings: Lung: Low Dose CT Chest recommended if Age 42-80 years, 30 pack-year currently smoking OR have quit w/in 15years. Patient does not qualify. Colorectal: Up to date  Additional Screenings:  Hepatitis C Screening: N/A      Plan:  I have personally reviewed and addressed the Medicare Annual Wellness questionnaire and have noted the following in the patient's chart:  A. Medical and social history B. Use of alcohol, tobacco or illicit drugs  C. Current medications and supplements D. Functional ability and status E.  Nutritional status F.  Physical activity G. Advance directives H. List of other physicians I.  Hospitalizations, surgeries, and ER visits in previous 12 months J.  [place] such as hearing and vision  if needed, cognitive and depression L. Referrals and appointments - none  In addition, I have  reviewed and discussed with patient certain preventive protocols, quality metrics, and best practice recommendations. A written personalized care plan for preventive services as well as general preventive health recommendations were provided to patient.  See attached scanned questionnaire for additional information.   Signed,  Fabio Neighbors, LPN Nurse Health Advisor   Nurse Recommendations: Pt needs a diabetic foot exam and urine microalbumin checked at next OV. [facility] and will request OV notes to be sent to our office to update HM.

## 2017-08-27 NOTE — Patient Instructions (Addendum)
Michael Murphy , Thank you for taking time to come for your Medicare Wellness Visit. I appreciate your ongoing commitment to your health goals. Please review the following plan we discussed and let me know if I can assist you in the future.   Screening recommendations/referrals: Colonoscopy: Up to date Recommended yearly ophthalmology/optometry visit for glaucoma screening and checkup Recommended yearly dental visit for hygiene and checkup  Vaccinations: Influenza vaccine: Up to date Pneumococcal vaccine: Up to date Tdap vaccine: Up to date Shingles vaccine: Pt declines today.     Advanced directives: Please bring a copy of your POA (Power of Attorney) and/or Living Will to your next appointment.   Conditions/risks identified: Recommend increasing water intake to 6 glasses a day.   Next appointment: 09/01/17 @ 1:20 PM with Dr Rosanna Randy.   Preventive Care 36 Years and Older, Male Preventive care refers to lifestyle choices and visits with your health care provider that can promote health and wellness. What does preventive care include?  A yearly physical exam. This is also called an annual well check.  Dental exams once or twice a year.  Routine eye exams. Ask your health care provider how often you should have your eyes checked.  Personal lifestyle choices, including:  Daily care of your teeth and gums.  Regular physical activity.  Eating a healthy diet.  Avoiding tobacco and drug use.  Limiting alcohol use.  Practicing safe sex.  Taking low doses of aspirin every day.  Taking vitamin and mineral supplements as recommended by your health care provider. What happens during an annual well check? The services and screenings done by your health care provider during your annual well check will depend on your age, overall health, lifestyle risk factors, and family history of disease. Counseling  Your health care provider may ask you questions about your:  Alcohol  use.  Tobacco use.  Drug use.  Emotional well-being.  Home and relationship well-being.  Sexual activity.  Eating habits.  History of falls.  Memory and ability to understand (cognition).  Work and work Statistician. Screening  You may have the following tests or measurements:  Height, weight, and BMI.  Blood pressure.  Lipid and cholesterol levels. These may be checked every 5 years, or more frequently if you are over 59 years old.  Skin check.  Lung cancer screening. You may have this screening every year starting at age 74 if you have a 30-pack-year history of smoking and currently smoke or have quit within the past 15 years.  Fecal occult blood test (FOBT) of the stool. You may have this test every year starting at age 19.  Flexible sigmoidoscopy or colonoscopy. You may have a sigmoidoscopy every 5 years or a colonoscopy every 10 years starting at age 38.  Prostate cancer screening. Recommendations will vary depending on your family history and other risks.  Hepatitis C blood test.  Hepatitis B blood test.  Sexually transmitted disease (STD) testing.  Diabetes screening. This is done by checking your blood sugar (glucose) after you have not eaten for a while (fasting). You may have this done every 1-3 years.  Abdominal aortic aneurysm (AAA) screening. You may need this if you are a current or former smoker.  Osteoporosis. You may be screened starting at age 47 if you are at high risk. Talk with your health care provider about your test results, treatment options, and if necessary, the need for more tests. Vaccines  Your health care provider may recommend certain vaccines, such as:  Influenza vaccine. This is recommended every year.  Tetanus, diphtheria, and acellular pertussis (Tdap, Td) vaccine. You may need a Td booster every 10 years.  Zoster vaccine. You may need this after age 19.  Pneumococcal 13-valent conjugate (PCV13) vaccine. One dose is  recommended after age 32.  Pneumococcal polysaccharide (PPSV23) vaccine. One dose is recommended after age 51. Talk to your health care provider about which screenings and vaccines you need and how often you need them. This information is not intended to replace advice given to you by your health care provider. Make sure you discuss any questions you have with your health care provider. Document Released: 03/22/2015 Document Revised: 11/13/2015 Document Reviewed: 12/25/2014 Elsevier Interactive Patient Education  2017 [place] Prevention in the Home Falls can cause injuries. They can happen to people of all ages. There are many things you can do to make your home safe and to help prevent falls. What can I do on the outside of my home?  Regularly fix the edges of walkways and driveways and fix any cracks.  Remove anything that might make you trip as you walk through a door, such as a raised step or threshold.  Trim any bushes or trees on the path to your home.  Use bright outdoor lighting.  Clear any walking paths of anything that might make someone trip, such as rocks or tools.  Regularly check to see if handrails are loose or broken. Make sure that both sides of any steps have handrails.  Any raised decks and porches should have guardrails on the edges.  Have any leaves, snow, or ice cleared regularly.  Use sand or salt on walking paths during winter.  Clean up any spills in your garage right away. This includes oil or grease spills. What can I do in the bathroom?  Use night lights.  Install grab bars by the toilet and in the tub and shower. Do not use towel bars as grab bars.  Use non-skid mats or decals in the tub or shower.  If you need to sit down in the shower, use a plastic, non-slip stool.  Keep the floor dry. Clean up any water that spills on the floor as soon as it happens.  Remove soap buildup in the tub or shower regularly.  Attach bath mats  securely with double-sided non-slip rug tape.  Do not have throw rugs and other things on the floor that can make you trip. What can I do in the bedroom?  Use night lights.  Make sure that you have a light by your bed that is easy to reach.  Do not use any sheets or blankets that are too big for your bed. They should not hang down onto the floor.  Have a firm chair that has side arms. You can use this for support while you get dressed.  Do not have throw rugs and other things on the floor that can make you trip. What can I do in the kitchen?  Clean up any spills right away.  Avoid walking on wet floors.  Keep items that you use a lot in easy-to-reach places.  If you need to reach something above you, use a strong step stool that has a grab bar.  Keep electrical cords out of the way.  Do not use floor polish or wax that makes floors slippery. If you must use wax, use non-skid floor wax.  Do not have throw rugs and other things on the floor that  can make you trip. What can I do with my stairs?  Do not leave any items on the stairs.  Make sure that there are handrails on both sides of the stairs and use them. Fix handrails that are broken or loose. Make sure that handrails are as long as the stairways.  Check any carpeting to make sure that it is firmly attached to the stairs. Fix any carpet that is loose or worn.  Avoid having throw rugs at the top or bottom of the stairs. If you do have throw rugs, attach them to the floor with carpet tape.  Make sure that you have a light switch at the top of the stairs and the bottom of the stairs. If you do not have them, ask someone to add them for you. What else can I do to help prevent falls?  Wear shoes that:  Do not have high heels.  Have rubber bottoms.  Are comfortable and fit you well.  Are closed at the toe. Do not wear sandals.  If you use a stepladder:  Make sure that it is fully opened. Do not climb a closed  stepladder.  Make sure that both sides of the stepladder are locked into place.  Ask someone to hold it for you, if possible.  Clearly mark and make sure that you can see:  Any grab bars or handrails.  First and last steps.  Where the edge of each step is.  Use tools that help you move around (mobility aids) if they are needed. These include:  Canes.  Walkers.  Scooters.  Crutches.  Turn on the lights when you go into a dark area. Replace any light bulbs as soon as they burn out.  Set up your furniture so you have a clear path. Avoid moving your furniture around.  If any of your floors are uneven, fix them.  If there are any pets around you, be aware of where they are.  Review your medicines with your doctor. Some medicines can make you feel dizzy. This can increase your chance of falling. Ask your doctor what other things that you can do to help prevent falls. This information is not intended to replace advice given to you by your health care provider. Make sure you discuss any questions you have with your health care provider. Document Released: 12/20/2008 Document Revised: 08/01/2015 Document Reviewed: 03/30/2014 Elsevier Interactive Patient Education  2017 [employer].

## 2017-09-01 ENCOUNTER — Ambulatory Visit (INDEPENDENT_AMBULATORY_CARE_PROVIDER_SITE_OTHER): Payer: Medicare Other | Admitting: Family Medicine

## 2017-09-01 ENCOUNTER — Encounter: Payer: Self-pay | Admitting: Family Medicine

## 2017-09-01 VITALS — BP 132/70 | HR 80 | Temp 98.5°F | Resp 20 | Ht 68.0 in | Wt 211.0 lb

## 2017-09-01 DIAGNOSIS — I10 Essential (primary) hypertension: Secondary | ICD-10-CM

## 2017-09-01 DIAGNOSIS — E119 Type 2 diabetes mellitus without complications: Secondary | ICD-10-CM

## 2017-09-01 DIAGNOSIS — C61 Malignant neoplasm of prostate: Secondary | ICD-10-CM | POA: Diagnosis not present

## 2017-09-01 LAB — POCT GLYCOSYLATED HEMOGLOBIN (HGB A1C): Hemoglobin A1C: 6.8 % — AB (ref 4.0–5.6)

## 2017-09-01 NOTE — Progress Notes (Signed)
Patient: Michael Murphy Male    DOB: 1928/01/02   84 y.o.   MRN: [id number] Visit Date: 09/01/2017  Today's Provider: [name], MD   Chief Complaint  Patient presents with  . Diabetes  . Hypertension   Subjective:    HPI  Diabetes Mellitus Type II, Follow-up:   Lab Results  Component Value Date   HGBA1C 6.8 (A) 09/01/2017   HGBA1C 6.6 05/04/2017   HGBA1C 7.0 12/01/2016    Last seen for diabetes 4 months ago.  Management since then includes no changes. He reports good compliance with treatment. He is not having side effects.  Current symptoms include none and have been stable. Home blood sugar records: trend: stable  Episodes of hypoglycemia? No  Weight trend: stable Prior visit with dietician: no Current diet: well balanced Current exercise: no regular exercise  Pertinent Labs:    Component Value Date/Time   CHOL 185 08/05/2016 0916   TRIG 66 08/05/2016 0916   HDL 68 08/05/2016 0916   LDLCALC 104 (H) 08/05/2016 0916   CREATININE 0.83 03/09/2017 0642    Wt Readings from Last 3 Encounters:  09/01/17 211 lb (95.7 kg)  08/27/17 203 lb 3.2 oz (92.2 kg)  05/04/17 215 lb (97.5 kg)    Hypertension, follow-up:  BP Readings from Last 3 Encounters:  09/01/17 132/70  08/27/17 (!) 136/58  05/04/17 140/60    He was last seen for hypertension 4 months ago.  BP at that visit was 140/60. Management since that visit includes no changes. He reports excellent compliance with treatment. He is not having side effects.  He is not exercising. He is adherent to low salt diet.   Outside blood pressures are checked occasionally. He is experiencing none.  Patient denies exertional chest pressure/discomfort, lower extremity edema and palpitations.   Cardiovascular risk factors include diabetes mellitus.      Allergies  Allergen Reactions  . Accupril  [Quinapril Hcl] Swelling    throat swells  . Quinapril Swelling     Current Outpatient  Medications:  .  albuterol (PROVENTIL HFA;VENTOLIN HFA) 108 (90 Base) MCG/ACT inhaler, Inhale 2 puffs into the lungs every 6 (six) hours as needed for wheezing or shortness of breath., Disp: 1 Inhaler, Rfl: 12 .  alendronate (FOSAMAX) 70 MG tablet, TAKE 1 TABLET ONCE EACH WEEK. TAKE WITH A FULL GLASS OF WATER ON AN EMPTY STOMACH, Disp: 12 tablet, Rfl: 3 .  amLODipine (NORVASC) 10 MG tablet, Take 1 tablet by mouth daily., Disp: , Rfl:  .  aspirin 81 MG chewable tablet, Chew by mouth daily., Disp: , Rfl:  .  calcium carbonate (CALCIUM 600) 600 MG TABS tablet, Take 600 mg by mouth daily., Disp: , Rfl:  .  FLAXSEED, LINSEED, PO, Take 2 capsules by mouth daily., Disp: , Rfl:  .  Garlic Oil 2694 MG CAPS, Take 1 capsule by mouth daily., Disp: , Rfl:  .  hydroxychloroquine (PLAQUENIL) 200 MG tablet, Take 400 mg by mouth daily. , Disp: , Rfl:  .  metFORMIN (GLUCOPHAGE) 500 MG tablet, Take 1 tablet by mouth daily., Disp: , Rfl:  .  Multiple Vitamins-Minerals (MENS MULTIVITAMIN PLUS PO), Take by mouth daily., Disp: , Rfl:  .  Omega-3 Fatty Acids (FISH OIL) 1000 MG CAPS, Take 1 capsule by mouth daily., Disp: , Rfl:  .  predniSONE (DELTASONE) 5 MG tablet, Take 5 mg by mouth daily with breakfast. , Disp: , Rfl:  .  simvastatin (ZOCOR) 20  MG tablet, Take 1 tablet by mouth at bedtime., Disp: , Rfl:  .  terazosin (HYTRIN) 10 MG capsule, Take 10 mg by mouth at bedtime. , Disp: , Rfl:  .  ULTRA FRESH 0.5 % SOLN, 1 drop daily as needed. , Disp: , Rfl:   Review of Systems  Constitutional: Negative.   HENT: Negative.   Eyes: Negative.   Respiratory: Negative.   Cardiovascular: Negative.   Endocrine: Negative for cold intolerance, heat intolerance, polydipsia, polyphagia and polyuria.  Musculoskeletal: Negative.   Allergic/Immunologic: Negative.   Neurological: Negative for dizziness, weakness and headaches.  Hematological: Negative.   Psychiatric/Behavioral: Negative.     Social History   Tobacco Use  .  Smoking status: Former Smoker    Packs/day: 0.50    Years: 18.00    Pack years: 9.00    Types: Cigarettes    Last attempt to quit: 03/09/1991    Years since quitting: 26.5  . Smokeless tobacco: Never Used  Substance Use Topics  . Alcohol use: Yes    Alcohol/week: 1.2 - 1.8 oz    Types: 2 - 3 Cans of beer per week   Objective:   BP 132/70 (BP Location: Right Arm, Patient Position: Sitting, Cuff Size: Normal)   Pulse 80   Temp 98.5 F (36.9 C)   Resp 20   Ht 5\' 8"  (1.727 m)   Wt 211 lb (95.7 kg)   SpO2 96%   BMI 32.08 kg/m  Vitals:   09/01/17 1334  BP: 132/70  Pulse: 80  Resp: 20  Temp: 98.5 F (36.9 C)  SpO2: 96%  Weight: 211 lb (95.7 kg)  Height: 5\' 8"  (1.727 m)     Physical Exam  Constitutional: He is oriented to person, place, and time. He appears well-developed and well-nourished.  HENT:  Head: Normocephalic and atraumatic.  Right Ear: External ear normal.  Left Ear: External ear normal.  Nose: Nose normal.  Eyes: Conjunctivae are normal. No scleral icterus.  Neck: No thyromegaly present.  Cardiovascular: Normal rate, regular rhythm and normal heart sounds.  Pulmonary/Chest: Effort normal and breath sounds normal.  Abdominal: Soft.  Musculoskeletal: He exhibits no edema.  Neurological: He is alert and oriented to person, place, and time.  Skin: Skin is warm and dry.  Psychiatric: He has a normal mood and affect. His behavior is normal. Judgment and thought content normal.        Assessment & Plan:     1. Type 2 diabetes mellitus without complication, without long-term current use of insulin (HCC) Good control. - POCT glycosylated hemoglobin (Hb A1C)--6.8  2.HTN 3.HLD 4.Mild COPD Pt quit smoking 1992.  I have done the exam and reviewed the chart and it is accurate to the best of my knowledge. Development worker, community has been used and  any errors in dictation or transcription are unintentional. Miguel Aschoff M.D. [place], MD  [facility]

## 2017-12-07 ENCOUNTER — Telehealth: Payer: Self-pay | Admitting: Family Medicine

## 2017-12-07 NOTE — Telephone Encounter (Signed)
Pt wants to continue care w/ Dr. Rosanna Randy.  Pt doesn't want [facility] including [facility]. Pt made a 4 month f/u appt in Dec with Dr. Rosanna Randy.  Thanks, [employer]

## 2017-12-07 NOTE — Telephone Encounter (Signed)
Spoke with the patient and he just wanted [place] to know that he didn't want to have to go to the [place] for everything.  He wants to stay here.  Advised him that we would continue caring for him and if he needed to go to the [place] for anything it would not prevent him from being seen here as well.

## 2017-12-24 ENCOUNTER — Other Ambulatory Visit: Payer: Self-pay | Admitting: Family Medicine

## 2017-12-24 DIAGNOSIS — C61 Malignant neoplasm of prostate: Secondary | ICD-10-CM

## 2017-12-27 ENCOUNTER — Other Ambulatory Visit: Payer: Medicare Other

## 2017-12-27 DIAGNOSIS — C61 Malignant neoplasm of prostate: Secondary | ICD-10-CM

## 2017-12-28 LAB — PSA: Prostate Specific Ag, Serum: 1.1 ng/mL (ref 0.0–4.0)

## 2017-12-30 ENCOUNTER — Ambulatory Visit: Payer: Medicare Other | Admitting: Urology

## 2018-01-06 ENCOUNTER — Ambulatory Visit (INDEPENDENT_AMBULATORY_CARE_PROVIDER_SITE_OTHER): Payer: Medicare Other | Admitting: Urology

## 2018-01-06 ENCOUNTER — Telehealth: Payer: Self-pay | Admitting: Urology

## 2018-01-06 ENCOUNTER — Encounter: Payer: Self-pay | Admitting: Urology

## 2018-01-06 VITALS — BP 138/68 | HR 101 | Ht 68.0 in | Wt 203.8 lb

## 2018-01-06 DIAGNOSIS — C61 Malignant neoplasm of prostate: Secondary | ICD-10-CM | POA: Diagnosis not present

## 2018-01-06 DIAGNOSIS — N529 Male erectile dysfunction, unspecified: Secondary | ICD-10-CM

## 2018-01-06 NOTE — Progress Notes (Signed)
01/06/2018 4:07 PM   Michael Murphy 1927/05/22 [id number]  Referring provider: Jerrol Banana., MD [address] [place], [place] [address]  Chief Complaint  Patient presents with  . Prostate Cancer   Urologic history: 1.  Prostate cancer -Radical prostatectomy Dr. Quillian Quince 2003; pathologic stage/grade unknown -PSA detectable last several years 0.7-0.9 range  2.  Erectile dysfunction   HPI: 84 year old male presents for annual follow-up.  He denies bothersome lower urinary tract symptoms.  He denies dysuria, gross hematuria or flank/abdominal/pelvic/scrotal pain. PSA 12/27/2017 was 1.1.  He has complained of the ED the past several years and wanted to try Cialis.  He is given prescriptions but has been reluctant to take this medication.  He inquired again whether I think this would be safe for him to try.  He is active and is not on nitrates.   PMH: Past Medical History:  Diagnosis Date  . Adiposity 07/12/2014  . Allergic rhinitis 02/11/2007  . Arthritis, degenerative 02/11/2007  . Benign essential HTN 02/11/2007   Goal BP< 130/80 due DMII.   Marland Kitchen CA of prostate ([place]) 07/12/2014  . Carpal tunnel syndrome 07/12/2014  . Diabetes mellitus without complication ([place])   . Diabetic neuropathy ([place]) 02/12/2009   s/p Lifestyle Education Diabetic Classes 2012.  Monofilament intact 09/18/2013-normal bilaterally.   . ED (erectile dysfunction) of organic origin 01/23/2013  . Epididymitis 07/12/2014  . Excessive urination at night 01/23/2013  . Hypercholesterolemia without hypertriglyceridemia 02/11/2007  . Hypertension   . Neurosis, posttraumatic 07/12/2014   ([place] [facility]) and ([place]).   . OP (osteoporosis) 02/11/2007    Surgical History: Past Surgical History:  Procedure Laterality Date  . CATARACT EXTRACTION    . KNEE ARTHROSCOPY     left  . PROSTATECTOMY    . ROTATOR CUFF REPAIR      Home Medications:  Allergies as of 01/06/2018      Reactions   Accupril   [quinapril Hcl] Swelling   throat swells   Quinapril Swelling      Medication List        Accurate as of 01/06/18  4:07 PM. Always use your most recent med list.          albuterol 108 (90 Base) MCG/ACT inhaler Commonly known as:  PROVENTIL HFA;VENTOLIN HFA Inhale 2 puffs into the lungs every 6 (six) hours as needed for wheezing or shortness of breath.   alendronate 70 MG tablet Commonly known as:  FOSAMAX TAKE 1 TABLET ONCE EACH WEEK. TAKE WITH A FULL GLASS OF WATER ON AN EMPTY STOMACH   amLODipine 10 MG tablet Commonly known as:  NORVASC Take 1 tablet by mouth daily.   aspirin 81 MG chewable tablet Chew by mouth daily.   CALCIUM 600 600 MG Tabs tablet Generic drug:  calcium carbonate Take 600 mg by mouth daily.   Fish Oil 1000 MG Caps Take 1 capsule by mouth daily.   FLAXSEED (LINSEED) PO Take 2 capsules by mouth daily.   Garlic Oil 4128 MG Caps Take 1 capsule by mouth daily.   hydroxychloroquine 200 MG tablet Commonly known as:  PLAQUENIL Take 400 mg by mouth daily.   losartan 50 MG tablet Commonly known as:  COZAAR Take 50 mg by mouth daily.   LUBRICATING PLUS EYE DROPS 0.5 % Soln Generic drug:  Carboxymethylcellulose Sod PF   MENS MULTIVITAMIN PLUS PO Take by mouth daily.   metFORMIN 500 MG tablet Commonly known as:  GLUCOPHAGE Take 1 tablet by  mouth daily.   predniSONE 5 MG tablet Commonly known as:  DELTASONE Take 5 mg by mouth daily with breakfast.   simvastatin 20 MG tablet Commonly known as:  ZOCOR Take 1 tablet by mouth at bedtime.   SPIRIVA HANDIHALER 18 MCG inhalation capsule Generic drug:  tiotropium Place 18 mcg into inhaler and inhale every morning.   terazosin 10 MG capsule Commonly known as:  HYTRIN Take 10 mg by mouth at bedtime.       Allergies:  Allergies  Allergen Reactions  . Accupril  [Quinapril Hcl] Swelling    throat swells  . Quinapril Swelling    Family History: Family History  Problem Relation Age of  Onset  . Hypertension Mother   . Stroke Mother   . Diabetes Sister   . Hypertension Sister   . Diabetes Brother   . Diabetes Brother     Social History:  reports that he quit smoking about 26 years ago. His smoking use included cigarettes. He has a 9.00 pack-year smoking history. He has never used smokeless tobacco. He reports that he drinks about 2.0 - 3.0 standard drinks of alcohol per week. He reports that he does not use drugs.  ROS: UROLOGY Frequent Urination?: No Hard to postpone urination?: No Burning/pain with urination?: No Get up at night to urinate?: No Leakage of urine?: No Urine stream starts and stops?: No Trouble starting stream?: No Do you have to strain to urinate?: No Blood in urine?: No Urinary tract infection?: No Sexually transmitted disease?: No Injury to kidneys or bladder?: No Painful intercourse?: No Weak stream?: No Erection problems?: No Penile pain?: No  Gastrointestinal Nausea?: No Vomiting?: No Indigestion/heartburn?: No Diarrhea?: No Constipation?: No  Constitutional Fever: No Night sweats?: No Weight loss?: No Fatigue?: No  Skin Skin rash/lesions?: No Itching?: No  Eyes Blurred vision?: No Double vision?: No  Ears/Nose/Throat Sore throat?: No Sinus problems?: No  Hematologic/Lymphatic Swollen glands?: No Easy bruising?: No  Cardiovascular Leg swelling?: No Chest pain?: No  Respiratory Cough?: No Shortness of breath?: No  Endocrine Excessive thirst?: No  Musculoskeletal Back pain?: No Joint pain?: No  Neurological Headaches?: No Dizziness?: No  Psychologic Depression?: No Anxiety?: No  Physical Exam: BP 138/68 (BP Location: Left Arm, Patient Position: Sitting, Cuff Size: Normal)   Pulse (!) 101   Ht 5\' 8"  (1.727 m)   Wt 203 lb 12.8 oz (92.4 kg)   BMI 30.99 kg/m   Constitutional:  Alert and oriented, No acute distress. HEENT: [place] AT, moist mucus membranes.  Trachea midline, no  masses. Cardiovascular: No clubbing, cyanosis, or edema. Respiratory: Normal respiratory effort, no increased work of breathing. GI: Abdomen is soft, nontender, nondistended, no abdominal masses GU: No CVA tenderness Lymph: No cervical or inguinal lymphadenopathy. Skin: No rashes, bruises or suspicious lesions. Neurologic: Grossly intact, no focal deficits, moving all 4 extremities. Psychiatric: Normal mood and affect.   Assessment & Plan:   84 year old male with history of prostate cancer with a detectable and slowly rising PSA.  Potential etiologies were discussed including residual benign prostate tissue or recurrent cancer.  Based on his age and lack of symptoms we will continue to monitor.  Follow-up annually.  I do think a trial of tadalafil would be safe for him and Rx tadalafil 10 mg was sent to his pharmacy.  Return in about 1 year (around 01/07/2019) for Recheck.   Abbie Sons, [place] [address], [place], [place] [address] [phone]

## 2018-01-06 NOTE — Telephone Encounter (Signed)
Pt called office stating that the Rx for Cialis hasn't been sent to his pharmacy[facility] [place]. Please advise. Thanks.

## 2018-01-07 ENCOUNTER — Other Ambulatory Visit: Payer: Self-pay | Admitting: Family Medicine

## 2018-01-07 NOTE — Telephone Encounter (Signed)
LMOM that per [facility] patient received a paper RX to take to the pharmacy along with a coupon card.

## 2018-01-11 DIAGNOSIS — E119 Type 2 diabetes mellitus without complications: Secondary | ICD-10-CM | POA: Diagnosis not present

## 2018-01-11 DIAGNOSIS — B351 Tinea unguium: Secondary | ICD-10-CM | POA: Diagnosis not present

## 2018-01-31 DIAGNOSIS — M1711 Unilateral primary osteoarthritis, right knee: Secondary | ICD-10-CM | POA: Diagnosis not present

## 2018-02-08 ENCOUNTER — Encounter: Payer: Self-pay | Admitting: Family Medicine

## 2018-02-08 ENCOUNTER — Ambulatory Visit (INDEPENDENT_AMBULATORY_CARE_PROVIDER_SITE_OTHER): Payer: Medicare Other | Admitting: Family Medicine

## 2018-02-08 VITALS — BP 128/60 | HR 86 | Temp 98.6°F | Resp 16 | Wt 209.0 lb

## 2018-02-08 DIAGNOSIS — E1149 Type 2 diabetes mellitus with other diabetic neurological complication: Secondary | ICD-10-CM | POA: Diagnosis not present

## 2018-02-08 DIAGNOSIS — E119 Type 2 diabetes mellitus without complications: Secondary | ICD-10-CM | POA: Diagnosis not present

## 2018-02-08 DIAGNOSIS — I10 Essential (primary) hypertension: Secondary | ICD-10-CM

## 2018-02-08 DIAGNOSIS — E7800 Pure hypercholesterolemia, unspecified: Secondary | ICD-10-CM | POA: Diagnosis not present

## 2018-02-08 DIAGNOSIS — D510 Vitamin B12 deficiency anemia due to intrinsic factor deficiency: Secondary | ICD-10-CM

## 2018-02-08 DIAGNOSIS — D649 Anemia, unspecified: Secondary | ICD-10-CM | POA: Diagnosis not present

## 2018-02-08 DIAGNOSIS — G629 Polyneuropathy, unspecified: Secondary | ICD-10-CM

## 2018-02-08 LAB — POCT GLYCOSYLATED HEMOGLOBIN (HGB A1C): Hemoglobin A1C: 6.3 % — AB (ref 4.0–5.6)

## 2018-02-08 NOTE — Progress Notes (Signed)
Patient: Michael Murphy Male    DOB: Nov 28, 1927   83 y.o.   MRN: [id number] Visit Date: 02/08/2018  Today's Provider: [name], MD   Chief Complaint  Patient presents with  . Diabetes   Subjective:     HPI   Patient is married father of 40.  He is not even sure how many grandchildren he has.  Overall he feels well.  His only new issue today is 1 of tingling in his toes and fingertips for the past month  He does have chronic arthritic pain.  Diabetes Mellitus Type II, Follow-up:   Lab Results  Component Value Date   HGBA1C 6.3 (A) 02/08/2018   HGBA1C 6.8 (A) 09/01/2017   HGBA1C 6.6 05/04/2017    Last seen for diabetes 4 months ago.  Management since then includes no changes. He reports good compliance with treatment. He is not having side effects.  Current symptoms include none and have been stable. Home blood sugar records: fasting range: 120s  Episodes of hypoglycemia? no   Current Insulin Regimen: none Most Recent Eye Exam: up to date Weight trend: stable Prior visit with dietician: no Current diet: well balanced Current exercise: no regular exercise, but he does stay active.   Pertinent Labs:    Component Value Date/Time   CHOL 185 08/05/2016 0916   TRIG 66 08/05/2016 0916   HDL 68 08/05/2016 0916   LDLCALC 104 (H) 08/05/2016 0916   CREATININE 0.83 03/09/2017 0642    Wt Readings from Last 3 Encounters:  02/08/18 209 lb (94.8 kg)  01/06/18 203 lb 12.8 oz (92.4 kg)  09/01/17 211 lb (95.7 kg)      Allergies  Allergen Reactions  . Accupril  [Quinapril Hcl] Swelling    throat swells  . Quinapril Swelling     Current Outpatient Medications:  .  albuterol (PROVENTIL HFA;VENTOLIN HFA) 108 (90 Base) MCG/ACT inhaler, Inhale 2 puffs into the lungs every 6 (six) hours as needed for wheezing or shortness of breath., Disp: 1 Inhaler, Rfl: 12 .  alendronate (FOSAMAX) 70 MG tablet, TAKE 1 TABLET ONCE EACH WEEK. TAKE WITH A FULL GLASS OF WATER  ON AN EMPTY STOMACH, Disp: 12 tablet, Rfl: 3 .  amLODipine (NORVASC) 10 MG tablet, Take 1 tablet by mouth daily., Disp: , Rfl:  .  aspirin 81 MG chewable tablet, Chew by mouth daily., Disp: , Rfl:  .  calcium carbonate (CALCIUM 600) 600 MG TABS tablet, Take 600 mg by mouth daily., Disp: , Rfl:  .  FLAXSEED, LINSEED, PO, Take 2 capsules by mouth daily., Disp: , Rfl:  .  Garlic Oil 3419 MG CAPS, Take 1 capsule by mouth daily., Disp: , Rfl:  .  hydroxychloroquine (PLAQUENIL) 200 MG tablet, Take 400 mg by mouth daily. , Disp: , Rfl:  .  losartan (COZAAR) 50 MG tablet, Take 50 mg by mouth daily., Disp: , Rfl:  .  LUBRICATING PLUS EYE DROPS 0.5 % SOLN, , Disp: , Rfl:  .  metFORMIN (GLUCOPHAGE) 500 MG tablet, Take 1 tablet by mouth daily., Disp: , Rfl:  .  Multiple Vitamins-Minerals (MENS MULTIVITAMIN PLUS PO), Take by mouth daily., Disp: , Rfl:  .  Omega-3 Fatty Acids (FISH OIL) 1000 MG CAPS, Take 1 capsule by mouth daily., Disp: , Rfl:  .  predniSONE (DELTASONE) 5 MG tablet, Take 5 mg by mouth daily with breakfast. , Disp: , Rfl:  .  simvastatin (ZOCOR) 20 MG tablet, Take 1  tablet by mouth at bedtime., Disp: , Rfl:  .  terazosin (HYTRIN) 10 MG capsule, Take 10 mg by mouth at bedtime. , Disp: , Rfl:  .  tiotropium (SPIRIVA HANDIHALER) 18 MCG inhalation capsule, Place 18 mcg into inhaler and inhale every morning., Disp: , Rfl:   Review of Systems  Constitutional: Negative.   HENT: Negative.   Eyes: Negative.   Respiratory: Negative for cough and shortness of breath.   Cardiovascular: Negative for chest pain, palpitations and leg swelling.  Gastrointestinal: Negative.   Endocrine: Negative.   Musculoskeletal: Negative for arthralgias, back pain and joint swelling.  Skin: Negative.   Allergic/Immunologic: Negative.   Neurological: Negative for dizziness, light-headedness and numbness.  Psychiatric/Behavioral: Negative.     Social History   Tobacco Use  . Smoking status: Former Smoker     Packs/day: 0.50    Years: 18.00    Pack years: 9.00    Types: Cigarettes    Last attempt to quit: 03/09/1991    Years since quitting: 26.9  . Smokeless tobacco: Never Used  Substance Use Topics  . Alcohol use: Yes    Alcohol/week: 2.0 - 3.0 standard drinks    Types: 2 - 3 Cans of beer per week   Objective:   BP 128/60 (BP Location: Left Arm, Patient Position: Sitting, Cuff Size: Normal)   Pulse 86   Temp 98.6 F (37 C)   Resp 16   Wt 209 lb (94.8 kg)   SpO2 98%   BMI 31.78 kg/m  Vitals:   02/08/18 1002  BP: 128/60  Pulse: 86  Resp: 16  Temp: 98.6 F (37 C)  SpO2: 98%  Weight: 209 lb (94.8 kg)     Physical Exam  Constitutional: He is oriented to person, place, and time. He appears well-developed and well-nourished.  HENT:  Head: Normocephalic and atraumatic.  Right Ear: External ear normal.  Left Ear: External ear normal.  Nose: Nose normal.  Eyes: Conjunctivae are normal. No scleral icterus.  Neck: No thyromegaly present.  Cardiovascular: Normal rate, regular rhythm and normal heart sounds.  Pulmonary/Chest: Effort normal and breath sounds normal.  Abdominal: Soft.  Musculoskeletal: He exhibits no edema.  Lymphadenopathy:    He has no cervical adenopathy.  Neurological: He is alert and oriented to person, place, and time. No cranial nerve deficit or sensory deficit. He exhibits normal muscle tone. Coordination normal.  Skin: Skin is warm and dry.  Psychiatric: He has a normal mood and affect. His behavior is normal. Judgment and thought content normal.        Assessment & Plan:     1. Type 2 diabetes mellitus without complication, without long-term current use of insulin (HCC) [facility]ed at the [facility] - POCT glycosylated hemoglobin (Hb A1C)  2. Benign essential HTN Controlled - Comprehensive metabolic panel - CBC with Differential/Platelet   4. Hypercholesterolemia without hypertriglyceridemia  - Lipid panel - TSH  5. Neuropathy A1c, TSH, B12.  May  need neurology referral  6. Other diabetic neurological complication associated with type 2 diabetes mellitus ([place])   7. Anemia, unspecified type   8. Pernicious anemia Check labs.  More than 50% of more than 25-minute visit spent in counseling and coordination of care. - Vitamin B12       [name], MD  [facility]

## 2018-02-09 ENCOUNTER — Ambulatory Visit: Payer: Self-pay | Admitting: Family Medicine

## 2018-02-09 DIAGNOSIS — D510 Vitamin B12 deficiency anemia due to intrinsic factor deficiency: Secondary | ICD-10-CM | POA: Diagnosis not present

## 2018-02-09 DIAGNOSIS — I10 Essential (primary) hypertension: Secondary | ICD-10-CM | POA: Diagnosis not present

## 2018-02-09 DIAGNOSIS — E7800 Pure hypercholesterolemia, unspecified: Secondary | ICD-10-CM | POA: Diagnosis not present

## 2018-02-10 LAB — CBC WITH DIFFERENTIAL/PLATELET
Basophils Absolute: 0 10*3/uL (ref 0.0–0.2)
Basos: 0 %
EOS (ABSOLUTE): 0.4 10*3/uL (ref 0.0–0.4)
Eos: 4 %
Hematocrit: 38.8 % (ref 37.5–51.0)
Hemoglobin: 13 g/dL (ref 13.0–17.7)
Immature Grans (Abs): 0 10*3/uL (ref 0.0–0.1)
Immature Granulocytes: 0 %
Lymphocytes Absolute: 2 10*3/uL (ref 0.7–3.1)
Lymphs: 22 %
MCH: 27.7 pg (ref 26.6–33.0)
MCHC: 33.5 g/dL (ref 31.5–35.7)
MCV: 83 fL (ref 79–97)
Monocytes Absolute: 0.8 10*3/uL (ref 0.1–0.9)
Monocytes: 9 %
Neutrophils Absolute: 5.6 10*3/uL (ref 1.4–7.0)
Neutrophils: 65 %
Platelets: 213 10*3/uL (ref 150–450)
RBC: 4.69 x10E6/uL (ref 4.14–5.80)
RDW: 14.1 % (ref 12.3–15.4)
WBC: 8.8 10*3/uL (ref 3.4–10.8)

## 2018-02-10 LAB — COMPREHENSIVE METABOLIC PANEL
ALT: 17 IU/L (ref 0–44)
AST: 20 IU/L (ref 0–40)
Albumin/Globulin Ratio: 1.9 (ref 1.2–2.2)
Albumin: 4.5 g/dL (ref 3.5–4.7)
Alkaline Phosphatase: 52 IU/L (ref 39–117)
BUN/Creatinine Ratio: 11 (ref 10–24)
BUN: 10 mg/dL (ref 8–27)
Bilirubin Total: 0.7 mg/dL (ref 0.0–1.2)
CO2: 23 mmol/L (ref 20–29)
Calcium: 9.4 mg/dL (ref 8.6–10.2)
Chloride: 104 mmol/L (ref 96–106)
Creatinine, Ser: 0.9 mg/dL (ref 0.76–1.27)
GFR calc Af Amer: 88 mL/min/{1.73_m2} (ref >59)
GFR calc non Af Amer: 77 mL/min/{1.73_m2} (ref >59)
Globulin, Total: 2.4 g/dL (ref 1.5–4.5)
Glucose: 89 mg/dL (ref 65–99)
Potassium: 4.3 mmol/L (ref 3.5–5.2)
Sodium: 142 mmol/L (ref 134–144)
Total Protein: 6.9 g/dL (ref 6.0–8.5)

## 2018-02-10 LAB — LIPID PANEL
Chol/HDL Ratio: 2.3 ratio (ref 0.0–5.0)
Cholesterol, Total: 183 mg/dL (ref 100–199)
HDL: 78 mg/dL (ref >39)
LDL Calculated: 95 mg/dL (ref 0–99)
Triglycerides: 51 mg/dL (ref 0–149)
VLDL Cholesterol Cal: 10 mg/dL (ref 5–40)

## 2018-02-10 LAB — VITAMIN B12: Vitamin B-12: 640 pg/mL (ref 232–1245)

## 2018-02-10 LAB — TSH: TSH: 1.3 u[IU]/mL (ref 0.450–4.500)

## 2018-02-14 ENCOUNTER — Telehealth: Payer: Self-pay

## 2018-02-14 NOTE — Telephone Encounter (Signed)
-----   Message from Jerrol Banana., MD sent at 02/14/2018  8:19 AM EST ----- Labs good.

## 2018-02-14 NOTE — Telephone Encounter (Signed)
Patient advised as directed below. 

## 2018-03-10 ENCOUNTER — Emergency Department: Payer: Medicare Other

## 2018-03-10 ENCOUNTER — Encounter: Payer: Self-pay | Admitting: Emergency Medicine

## 2018-03-10 ENCOUNTER — Emergency Department
Admission: EM | Admit: 2018-03-10 | Discharge: 2018-03-10 | Disposition: A | Discharge status: Home / Self Care | Payer: Medicare Other | Attending: Emergency Medicine | Admitting: Emergency Medicine

## 2018-03-10 ENCOUNTER — Other Ambulatory Visit: Payer: Self-pay

## 2018-03-10 DIAGNOSIS — Z7982 Long term (current) use of aspirin: Secondary | ICD-10-CM | POA: Insufficient documentation

## 2018-03-10 DIAGNOSIS — Z87891 Personal history of nicotine dependence: Secondary | ICD-10-CM | POA: Insufficient documentation

## 2018-03-10 DIAGNOSIS — J209 Acute bronchitis, unspecified: Secondary | ICD-10-CM | POA: Diagnosis not present

## 2018-03-10 DIAGNOSIS — I10 Essential (primary) hypertension: Secondary | ICD-10-CM | POA: Diagnosis not present

## 2018-03-10 DIAGNOSIS — J449 Chronic obstructive pulmonary disease, unspecified: Secondary | ICD-10-CM | POA: Diagnosis not present

## 2018-03-10 DIAGNOSIS — E1140 Type 2 diabetes mellitus with diabetic neuropathy, unspecified: Secondary | ICD-10-CM | POA: Insufficient documentation

## 2018-03-10 DIAGNOSIS — Z7984 Long term (current) use of oral hypoglycemic drugs: Secondary | ICD-10-CM | POA: Diagnosis not present

## 2018-03-10 DIAGNOSIS — Z79899 Other long term (current) drug therapy: Secondary | ICD-10-CM | POA: Diagnosis not present

## 2018-03-10 DIAGNOSIS — R05 Cough: Secondary | ICD-10-CM | POA: Diagnosis not present

## 2018-03-10 DIAGNOSIS — R0602 Shortness of breath: Secondary | ICD-10-CM | POA: Diagnosis not present

## 2018-03-10 LAB — CBC
HCT: 37.3 % — ABNORMAL LOW (ref 39.0–52.0)
Hemoglobin: 12.2 g/dL — ABNORMAL LOW (ref 13.0–17.0)
MCH: 28.1 pg (ref 26.0–34.0)
MCHC: 32.7 g/dL (ref 30.0–36.0)
MCV: 85.9 fL (ref 80.0–100.0)
Platelets: 187 10*3/uL (ref 150–400)
RBC: 4.34 MIL/uL (ref 4.22–5.81)
RDW: 13.4 % (ref 11.5–15.5)
WBC: 8.7 10*3/uL (ref 4.0–10.5)
nRBC: 0 % (ref 0.0–0.2)

## 2018-03-10 LAB — BASIC METABOLIC PANEL
Anion gap: 8 (ref 5–15)
BUN: 11 mg/dL (ref 8–23)
CO2: 24 mmol/L (ref 22–32)
Calcium: 9 mg/dL (ref 8.9–10.3)
Chloride: 106 mmol/L (ref 98–111)
Creatinine, Ser: 0.85 mg/dL (ref 0.61–1.24)
GFR calc Af Amer: >60 mL/min (ref >60)
GFR calc non Af Amer: >60 mL/min (ref >60)
Glucose, Bld: 148 mg/dL — ABNORMAL HIGH (ref 70–99)
Potassium: 3.6 mmol/L (ref 3.5–5.1)
Sodium: 138 mmol/L (ref 135–145)

## 2018-03-10 LAB — INFLUENZA PANEL BY PCR (TYPE A & B)
Influenza A By PCR: NEGATIVE
Influenza B By PCR: NEGATIVE

## 2018-03-10 LAB — TROPONIN I: Troponin I: <0.03 ng/mL (ref <0.03)

## 2018-03-10 MED ORDER — PREDNISONE 20 MG PO TABS
60.0000 mg | ORAL_TABLET | Freq: Once | ORAL | Status: AC
  Administered 2018-03-10: 60 mg via ORAL
  Filled 2018-03-10: qty 3

## 2018-03-10 MED ORDER — IPRATROPIUM-ALBUTEROL 0.5-2.5 (3) MG/3ML IN SOLN
3.0000 mL | Freq: Once | RESPIRATORY_TRACT | Status: AC
  Administered 2018-03-10: 3 mL via RESPIRATORY_TRACT
  Filled 2018-03-10: qty 3

## 2018-03-10 MED ORDER — PREDNISONE 20 MG PO TABS: ORAL_TABLET | ORAL | 0 refills | Status: DC

## 2018-03-10 NOTE — ED Provider Notes (Signed)
[facility] Emergency Department Provider Note ____________________________________________   First MD Initiated Contact with Patient 03/10/18 1530     (approximate)  I have reviewed the triage vital signs and the nursing notes.   HISTORY  Chief Complaint Shortness of Breath    HPI Michael Murphy is a 85 y.o. male with PMH as noted below who presents with shortness of breath over the last few days, gradual onset, worsening, not relieved by his inhaler at home, and associated with cough productive of yellow/brown sputum.  He denies fever, chest pain, or vomiting.   Past Medical History:  Diagnosis Date  . Adiposity 07/12/2014  . Allergic rhinitis 02/11/2007  . Arthritis, degenerative 02/11/2007  . Benign essential HTN 02/11/2007   Goal BP< 130/80 due DMII.   Marland Kitchen CA of prostate ([place]) 07/12/2014  . Carpal tunnel syndrome 07/12/2014  . Diabetes mellitus without complication ([place])   . Diabetic neuropathy ([place]) 02/12/2009   s/p Lifestyle Education Diabetic Classes 2012.  Monofilament intact 09/18/2013-normal bilaterally.   . ED (erectile dysfunction) of organic origin 01/23/2013  . Epididymitis 07/12/2014  . Excessive urination at night 01/23/2013  . Hypercholesterolemia without hypertriglyceridemia 02/11/2007  . Hypertension   . Neurosis, posttraumatic 07/12/2014   ([place]) and ([place]).   . OP (osteoporosis) 02/11/2007    Patient Active Problem List   Diagnosis Date Noted  . Type 2 diabetes mellitus without complication, without long-term current use of insulin ([place]) 05/04/2017  . Abnormal laboratory test 07/12/2014  . Carpal tunnel syndrome 07/12/2014  . Adiposity 07/12/2014  . Neurosis, posttraumatic 07/12/2014  . CA of prostate ([place]) 07/12/2014  . Excessive urination at night 01/23/2013  . ED (erectile dysfunction) of organic origin 01/23/2013  . Malignant neoplasm of prostate ([place]) 12/07/2011  . Personal history of prostate cancer 12/07/2011  .  Diabetic neuropathy ([place]) 02/12/2009  . Allergic rhinitis 02/11/2007  . Benign essential HTN 02/11/2007  . Arthritis, degenerative 02/11/2007  . OP (osteoporosis) 02/11/2007  . Hypercholesterolemia without hypertriglyceridemia 02/11/2007  . Current tobacco use 02/11/2007    Past Surgical History:  Procedure Laterality Date  . CATARACT EXTRACTION    . KNEE ARTHROSCOPY     left  . PROSTATECTOMY    . ROTATOR CUFF REPAIR      Prior to Admission medications   Medication Sig Start Date End Date Taking? Authorizing Provider  albuterol (PROVENTIL HFA;VENTOLIN HFA) 108 (90 Base) MCG/ACT inhaler Inhale 2 puffs into the lungs every 6 (six) hours as needed for wheezing or shortness of breath. 03/11/17   Jerrol Banana., MD  alendronate (FOSAMAX) 70 MG tablet TAKE 1 TABLET ONCE EACH WEEK. TAKE WITH A FULL GLASS OF WATER ON AN EMPTY STOMACH 05/11/17   Jerrol Banana., MD  amLODipine (NORVASC) 10 MG tablet Take 1 tablet by mouth daily. 10/15/10   [provider]  aspirin 81 MG chewable tablet Chew by mouth daily.    [provider]  calcium carbonate (CALCIUM 600) 600 MG TABS tablet Take 600 mg by mouth daily. 12/07/11   [provider]  FLAXSEED, LINSEED, PO Take 2 capsules by mouth daily. 02/25/10   [provider]  Garlic Oil 2725 MG CAPS Take 1 capsule by mouth daily. 04/14/07   [provider]  hydroxychloroquine (PLAQUENIL) 200 MG tablet Take 400 mg by mouth daily.  07/04/17   [provider]  losartan (COZAAR) 50 MG tablet Take 50 mg by mouth daily.    [provider]  LUBRICATING PLUS EYE DROPS 0.5 % SOLN  12/13/17   [provider]  metFORMIN (GLUCOPHAGE) 500 MG tablet Take 1 tablet by mouth daily.    [provider]  Multiple Vitamins-Minerals (MENS MULTIVITAMIN PLUS PO) Take by mouth daily.    [provider]  Omega-3 Fatty Acids (FISH OIL) 1000 MG CAPS Take 1 capsule by mouth daily. 04/14/07    [provider]  predniSONE (DELTASONE) 20 MG tablet Take 60mg  (3 tabs) tomorrow, then 40mg  (2 tabs) daily for 2 days, then 20mg  (1 tab) daily for 2 days, then resume your normal daily dose 03/10/18   Arta Silence, MD  predniSONE (DELTASONE) 5 MG tablet Take 5 mg by mouth daily with breakfast.  07/04/17   [provider]  simvastatin (ZOCOR) 20 MG tablet Take 1 tablet by mouth at bedtime.    [provider]  terazosin (HYTRIN) 10 MG capsule Take 10 mg by mouth at bedtime.  10/22/16   [provider]  tiotropium (SPIRIVA HANDIHALER) 18 MCG inhalation capsule Place 18 mcg into inhaler and inhale every morning.    [provider]    Allergies Accupril  [quinapril hcl] and Quinapril  Family History  Problem Relation Age of Onset  . Hypertension Mother   . Stroke Mother   . Diabetes Sister   . Hypertension Sister   . Diabetes Brother   . Diabetes Brother     Social History Social History   Tobacco Use  . Smoking status: Former Smoker    Packs/day: 0.50    Years: 18.00    Pack years: 9.00    Types: Cigarettes    Last attempt to quit: 03/09/1991    Years since quitting: 27.0  . Smokeless tobacco: Never Used  Substance Use Topics  . Alcohol use: Yes    Alcohol/week: 2.0 - 3.0 standard drinks    Types: 2 - 3 Cans of beer per week  . Drug use: No    Review of Systems  Constitutional: No fever. Eyes: No redness. ENT: No sore throat. Cardiovascular: Denies chest pain. Respiratory: Positive for shortness of breath. Gastrointestinal: No vomiting or diarrhea.  Genitourinary: Negative for flank pain.  Musculoskeletal: Negative for back pain. Skin: Negative for rash. Neurological: Negative for headache.   ____________________________________________   PHYSICAL EXAM:  VITAL SIGNS: ED Triage Vitals  Enc Vitals Group     BP 03/10/18 1257 (!) 134/59     Pulse Rate 03/10/18 1257 100     Resp 03/10/18 1257 20     Temp 03/10/18  1257 98.1 F (36.7 C)     Temp Source 03/10/18 1257 Oral     SpO2 03/10/18 1257 97 %     Weight 03/10/18 1258 200 lb (90.7 kg)     Height 03/10/18 1258 5\' 8"  (1.727 m)     Head Circumference --      Peak Flow --      Pain Score 03/10/18 1306 0     Pain Loc --      Pain Edu? --      Excl. in [place]? --     Constitutional: Alert and oriented.  Relatively well appearing for age and in no acute distress. Eyes: Conjunctivae are normal.  Head: Atraumatic. Nose: No congestion/rhinnorhea. Mouth/Throat: Mucous membranes are moist.   Neck: Normal range of motion.  Cardiovascular: Normal rate, regular rhythm. Grossly normal heart sounds.  Good peripheral circulation. Respiratory: Normal respiratory effort.  No retractions.  Diffuse wheezing bilaterally.  Gastrointestinal:  No distention.  Musculoskeletal: No lower extremity edema.  Extremities warm and well perfused.  Neurologic:  Normal speech and language. No gross focal neurologic deficits are appreciated.  Skin:  Skin is warm and dry. No rash noted. Psychiatric: Mood and affect are normal. Speech and behavior are normal.  ____________________________________________   LABS (all labs ordered are listed, but only abnormal results are displayed)  Labs Reviewed  BASIC METABOLIC PANEL - Abnormal; Notable for the following components:      Result Value   Glucose, Bld 148 (*)    All other components within normal limits  CBC - Abnormal; Notable for the following components:   Hemoglobin 12.2 (*)    HCT 37.3 (*)    All other components within normal limits  TROPONIN I  INFLUENZA PANEL BY PCR (TYPE A & B)   ____________________________________________  EKG  ED ECG REPORT I, Arta Silence, the attending physician, personally viewed and interpreted this ECG.  Date: 03/10/2018 EKG Time: 1255 Rate: 105 Rhythm: normal sinus rhythm QRS Axis: normal Intervals: LAFB ST/T Wave abnormalities: normal Narrative Interpretation: no  evidence of acute ischemia  ____________________________________________  RADIOLOGY  CXR: Bronchitic changes with no focal infiltrate  ____________________________________________   PROCEDURES  Procedure(s) performed: No  Procedures  Critical Care performed: No ____________________________________________   INITIAL IMPRESSION / ASSESSMENT AND PLAN / ED COURSE  Pertinent labs & imaging results that were available during my care of the patient were reviewed by me and considered in my medical decision making (see chart for details).  85 year old male with a history of COPD and other PMH as noted above presents with shortness of breath and productive cough over the last several days.  He denies fever or chest pain.  I reviewed the past medical records in Epic; the patient has not been in the ED within the last year.  On exam he is overall very well-appearing for his age with no respiratory distress or significantly increased respiratory effort.  His O2 saturation is in the high 90s on room air and his other vital signs are normal.  He does have diffuse wheezing on lung exam.  Lab work-up obtained from triage is within normal limits.  His chest x-ray shows no focal infiltrate.  Overall I suspect most likely COPD exacerbation, probably exacerbated by viral upper respiratory infection/acute bronchitis.  We will obtain a flu swab, give bronchodilators and steroids, and reassess.  I anticipate that the patient will be able to go home.  ----------------------------------------- 4:49 PM on 03/10/2018 -----------------------------------------  Flu swab is negative.  The patient is feeling better after the nebulizer treatment.  O2 saturation remains in the mid to high 90s on room air.  He feels comfortable and wants to go home.  I counseled him and his son on the results of the work-up.  I will give a prescription for a taper of prednisone for the next several days.  Return precautions  given, and they expressed understanding. ____________________________________________   FINAL CLINICAL IMPRESSION(S) / ED DIAGNOSES  Final diagnoses:  Acute bronchitis, unspecified organism      NEW MEDICATIONS STARTED DURING THIS VISIT:  New Prescriptions   PREDNISONE (DELTASONE) 20 MG TABLET    Take 60mg  (3 tabs) tomorrow, then 40mg  (2 tabs) daily for 2 days, then 20mg  (1 tab) daily for 2 days, then resume your normal daily dose     Note:  This document was prepared using Dragon voice recognition software and may include unintentional dictation errors.  Arta Silence, MD 03/10/18 1650

## 2018-03-10 NOTE — ED Triage Notes (Signed)
PT c/o worsening SOB and productive cough xfew days. Hx of COPD.  Denies fever. A&OX4, RR even and unlabored.

## 2018-03-10 NOTE — Discharge Instructions (Addendum)
Take the prednisone as prescribed: 3 tablets tomorrow, 2 tablets each day for the next 2 days after that, 1 tablet each day for the following 2 days, and then resume your normal dose of 5 mg daily.  You should use your albuterol inhaler every 4-6 hours for the next several days.  Follow-up with your primary care doctor in approximately 1 week.  Return to the ER for new, worsening, persistent severe shortness of breath, chest pain, weakness, fever, or any other new or worsening symptoms that concern you.

## 2018-03-10 NOTE — ED Notes (Signed)
Pt verbalizes d/c understanding and follow up , ot in NAD. VSS. PT unable to sign due to no computer available in hallway

## 2018-03-24 ENCOUNTER — Encounter: Payer: Self-pay | Admitting: Family Medicine

## 2018-03-24 ENCOUNTER — Ambulatory Visit (INDEPENDENT_AMBULATORY_CARE_PROVIDER_SITE_OTHER): Payer: Medicare Other | Admitting: Family Medicine

## 2018-03-24 VITALS — BP 142/60 | HR 94 | Temp 98.9°F | Resp 24 | Wt 215.0 lb

## 2018-03-24 DIAGNOSIS — I10 Essential (primary) hypertension: Secondary | ICD-10-CM | POA: Diagnosis not present

## 2018-03-24 DIAGNOSIS — J440 Chronic obstructive pulmonary disease with acute lower respiratory infection: Secondary | ICD-10-CM

## 2018-03-24 DIAGNOSIS — J209 Acute bronchitis, unspecified: Secondary | ICD-10-CM

## 2018-03-24 DIAGNOSIS — E119 Type 2 diabetes mellitus without complications: Secondary | ICD-10-CM | POA: Diagnosis not present

## 2018-03-24 DIAGNOSIS — Z720 Tobacco use: Secondary | ICD-10-CM | POA: Diagnosis not present

## 2018-03-24 MED ORDER — PREDNISONE 10 MG (21) PO TBPK: ORAL_TABLET | ORAL | 0 refills | Status: DC

## 2018-03-24 NOTE — Progress Notes (Signed)
Patient: Michael Murphy Male    DOB: Feb 22, 1928   85 y.o.   MRN: [id number] Visit Date: 03/24/2018  Today's Provider: [name], MD   Chief Complaint  Patient presents with  . ER follow up   Subjective:     HPI  Follow Up ER Visit  Patient is here for ER follow up.  He was recently seen at [facility] for acute bronchitis on 03/10/2018. Treatment for this included prednisone 20mg  taper. He reports good compliance with treatment. He reports this condition is Improved. However, patient reports that he is still wheezing. He still has to use his rescue inhaler multiple times a day. He is also requesting requesting refills on duoneb.  He is feeling much better.    Allergies  Allergen Reactions  . Accupril  [Quinapril Hcl] Swelling    throat swells  . Quinapril Swelling     Current Outpatient Medications:  .  albuterol (PROVENTIL HFA;VENTOLIN HFA) 108 (90 Base) MCG/ACT inhaler, Inhale 2 puffs into the lungs every 6 (six) hours as needed for wheezing or shortness of breath., Disp: 1 Inhaler, Rfl: 12 .  alendronate (FOSAMAX) 70 MG tablet, TAKE 1 TABLET ONCE EACH WEEK. TAKE WITH A FULL GLASS OF WATER ON AN EMPTY STOMACH, Disp: 12 tablet, Rfl: 3 .  amLODipine (NORVASC) 10 MG tablet, Take 1 tablet by mouth daily., Disp: , Rfl:  .  aspirin 81 MG chewable tablet, Chew by mouth daily., Disp: , Rfl:  .  calcium carbonate (CALCIUM 600) 600 MG TABS tablet, Take 600 mg by mouth daily., Disp: , Rfl:  .  FLAXSEED, LINSEED, PO, Take 2 capsules by mouth daily., Disp: , Rfl:  .  Garlic Oil 6222 MG CAPS, Take 1 capsule by mouth daily., Disp: , Rfl:  .  hydroxychloroquine (PLAQUENIL) 200 MG tablet, Take 400 mg by mouth daily. , Disp: , Rfl:  .  losartan (COZAAR) 50 MG tablet, Take 50 mg by mouth daily., Disp: , Rfl:  .  LUBRICATING PLUS EYE DROPS 0.5 % SOLN, , Disp: , Rfl:  .  metFORMIN (GLUCOPHAGE) 500 MG tablet, Take 1 tablet by mouth daily., Disp: , Rfl:  .  Multiple Vitamins-Minerals  (MENS MULTIVITAMIN PLUS PO), Take by mouth daily., Disp: , Rfl:  .  Omega-3 Fatty Acids (FISH OIL) 1000 MG CAPS, Take 1 capsule by mouth daily., Disp: , Rfl:  .  predniSONE (DELTASONE) 5 MG tablet, Take 5 mg by mouth daily with breakfast. , Disp: , Rfl:  .  simvastatin (ZOCOR) 20 MG tablet, Take 1 tablet by mouth at bedtime., Disp: , Rfl:  .  predniSONE (DELTASONE) 20 MG tablet, Take 60mg  (3 tabs) tomorrow, then 40mg  (2 tabs) daily for 2 days, then 20mg  (1 tab) daily for 2 days, then resume your normal daily dose (Patient not taking: Reported on 03/24/2018), Disp: 9 tablet, Rfl: 0 .  terazosin (HYTRIN) 10 MG capsule, Take 10 mg by mouth at bedtime. , Disp: , Rfl:  .  tiotropium (SPIRIVA HANDIHALER) 18 MCG inhalation capsule, Place 18 mcg into inhaler and inhale every morning., Disp: , Rfl:   Review of Systems  Constitutional: Negative for activity change, appetite change, chills and fatigue.  HENT: Negative.   Eyes: Negative.   Respiratory: Positive for cough, shortness of breath and wheezing.   Cardiovascular: Negative for chest pain, palpitations and leg swelling.  Gastrointestinal: Negative.   Endocrine: Negative.   Allergic/Immunologic: Positive for environmental allergies.  Psychiatric/Behavioral: Negative.  Social History   Tobacco Use  . Smoking status: Former Smoker    Packs/day: 0.50    Years: 18.00    Pack years: 9.00    Types: Cigarettes    Last attempt to quit: 03/09/1991    Years since quitting: 27.0  . Smokeless tobacco: Never Used  Substance Use Topics  . Alcohol use: Yes    Alcohol/week: 2.0 - 3.0 standard drinks    Types: 2 - 3 Cans of beer per week      Objective:   BP (!) 142/60 (BP Location: Right Arm, Patient Position: Sitting, Cuff Size: Large)   Pulse 94   Temp 98.9 F (37.2 C)   Resp (!) 24   Wt 215 lb (97.5 kg)   SpO2 96%   BMI 32.69 kg/m  Vitals:   03/24/18 0949  BP: (!) 142/60  Pulse: 94  Resp: (!) 24  Temp: 98.9 F (37.2 C)  SpO2:  96%  Weight: 215 lb (97.5 kg)     Physical Exam Constitutional:      Appearance: He is well-developed.  HENT:     Head: Normocephalic and atraumatic.     Right Ear: External ear normal.     Left Ear: External ear normal.     Nose: Nose normal.  Eyes:     General: No scleral icterus.    Conjunctiva/sclera: Conjunctivae normal.  Neck:     Thyroid: No thyromegaly.  Cardiovascular:     Rate and Rhythm: Normal rate and regular rhythm.     Heart sounds: Normal heart sounds.  Pulmonary:     Effort: Pulmonary effort is normal.     Breath sounds: Wheezing present.     Comments: Mild diffuse wheezes. Abdominal:     Palpations: Abdomen is soft.  Lymphadenopathy:     Cervical: No cervical adenopathy.  Skin:    General: Skin is warm and dry.  Neurological:     Mental Status: He is alert and oriented to person, place, and time.     Cranial Nerves: No cranial nerve deficit.     Sensory: No sensory deficit.     Motor: No abnormal muscle tone.     Coordination: Coordination normal.  Psychiatric:        Behavior: Behavior normal.        Thought Content: Thought content normal.        Judgment: Judgment normal.          Assessment & Plan    1. Acute bronchitis with COPD ([place]) Improving.  Continue inhalers  at home. - predniSONE (STERAPRED UNI-PAK 21 TAB) 10 MG (21) TBPK tablet; Taper as directed.  Dispense: 21 tablet; Refill: 0  2. Type 2 diabetes mellitus without complication, without long-term current use of insulin ([place])   3. Benign essential HTN   4. Current tobacco use Patient encouraged to not smoke.  I have done the exam and reviewed the chart and it is accurate to the best of my knowledge. Development worker, community has been used and  any errors in dictation or transcription are unintentional. Miguel Aschoff M.D. [place], MD  [facility]

## 2018-04-12 ENCOUNTER — Ambulatory Visit (INDEPENDENT_AMBULATORY_CARE_PROVIDER_SITE_OTHER): Payer: Medicare Other | Admitting: Family Medicine

## 2018-04-12 ENCOUNTER — Encounter: Payer: Self-pay | Admitting: Family Medicine

## 2018-04-12 ENCOUNTER — Other Ambulatory Visit: Payer: Self-pay | Admitting: Family Medicine

## 2018-04-12 VITALS — BP 138/58 | HR 91 | Temp 98.1°F | Wt 208.8 lb

## 2018-04-12 DIAGNOSIS — J441 Chronic obstructive pulmonary disease with (acute) exacerbation: Secondary | ICD-10-CM

## 2018-04-12 DIAGNOSIS — E119 Type 2 diabetes mellitus without complications: Secondary | ICD-10-CM

## 2018-04-12 DIAGNOSIS — M47817 Spondylosis without myelopathy or radiculopathy, lumbosacral region: Secondary | ICD-10-CM | POA: Diagnosis not present

## 2018-04-12 MED ORDER — PREDNISONE 10 MG (21) PO TBPK: ORAL_TABLET | ORAL | 1 refills | Status: DC

## 2018-04-12 MED ORDER — DOXYCYCLINE HYCLATE 100 MG PO TABS: 100.0000 mg | ORAL_TABLET | Freq: Two times a day (BID) | ORAL | 1 refills | Status: DC

## 2018-04-12 NOTE — Progress Notes (Signed)
Patient: Michael Murphy Male    DOB: 05-17-1927   85 y.o.   MRN: [id number] Visit Date: 04/12/2018  Today's Provider: [name], MD   Chief Complaint  Patient presents with  . URI   Subjective:     URI   This is a new problem. Episode onset: 2 days ago. The problem has been gradually improving. There has been no fever. Associated symptoms include congestion, coughing and wheezing. He has tried inhaler use for the symptoms. The treatment provided no relief.  He has a nebulizer at home.  He has been slowly getting better but he still does not feel well, the cough continues and he has mild shortness of breath.  Allergies  Allergen Reactions  . Accupril  [Quinapril Hcl] Swelling    throat swells  . Quinapril Swelling     Current Outpatient Medications:  .  albuterol (PROVENTIL HFA;VENTOLIN HFA) 108 (90 Base) MCG/ACT inhaler, Inhale 2 puffs into the lungs every 6 (six) hours as needed for wheezing or shortness of breath., Disp: 1 Inhaler, Rfl: 12 .  alendronate (FOSAMAX) 70 MG tablet, TAKE 1 TABLET ONCE EACH WEEK. TAKE WITH A FULL GLASS OF WATER ON AN EMPTY STOMACH, Disp: 12 tablet, Rfl: 3 .  amLODipine (NORVASC) 10 MG tablet, Take 1 tablet by mouth daily., Disp: , Rfl:  .  aspirin 81 MG chewable tablet, Chew by mouth daily., Disp: , Rfl:  .  calcium carbonate (CALCIUM 600) 600 MG TABS tablet, Take 600 mg by mouth daily., Disp: , Rfl:  .  FLAXSEED, LINSEED, PO, Take 2 capsules by mouth daily., Disp: , Rfl:  .  Garlic Oil 6314 MG CAPS, Take 1 capsule by mouth daily., Disp: , Rfl:  .  hydroxychloroquine (PLAQUENIL) 200 MG tablet, Take 400 mg by mouth daily. , Disp: , Rfl:  .  losartan (COZAAR) 50 MG tablet, Take 50 mg by mouth daily., Disp: , Rfl:  .  LUBRICATING PLUS EYE DROPS 0.5 % SOLN, , Disp: , Rfl:  .  metFORMIN (GLUCOPHAGE) 500 MG tablet, Take 1 tablet by mouth daily., Disp: , Rfl:  .  Multiple Vitamins-Minerals (MENS MULTIVITAMIN PLUS PO), Take by mouth daily.,  Disp: , Rfl:  .  Omega-3 Fatty Acids (FISH OIL) 1000 MG CAPS, Take 1 capsule by mouth daily., Disp: , Rfl:  .  simvastatin (ZOCOR) 20 MG tablet, Take 1 tablet by mouth at bedtime., Disp: , Rfl:  .  SYMBICORT 160-4.5 MCG/ACT inhaler, , Disp: , Rfl:  .  terazosin (HYTRIN) 10 MG capsule, Take 10 mg by mouth at bedtime. , Disp: , Rfl:  .  tiotropium (SPIRIVA HANDIHALER) 18 MCG inhalation capsule, Place 18 mcg into inhaler and inhale every morning., Disp: , Rfl:   Review of Systems  Constitutional: Negative.   HENT: Positive for congestion.   Eyes: Negative.   Respiratory: Positive for cough and wheezing.   Cardiovascular: Negative.   Gastrointestinal: Negative.   Endocrine: Negative.   Musculoskeletal: Negative.   Allergic/Immunologic: Negative.   Neurological: Negative.   Psychiatric/Behavioral: Negative.     Social History   Tobacco Use  . Smoking status: Former Smoker    Packs/day: 0.50    Years: 18.00    Pack years: 9.00    Types: Cigarettes    Last attempt to quit: 03/09/1991    Years since quitting: 27.1  . Smokeless tobacco: Never Used  Substance Use Topics  . Alcohol use: Yes    Alcohol/week: 2.0 -  3.0 standard drinks    Types: 2 - 3 Cans of beer per week      Objective:   BP (!) 138/58 (BP Location: Right Arm, Patient Position: Sitting, Cuff Size: Normal)   Pulse 91   Temp 98.1 F (36.7 C) (Oral)   Wt 208 lb 12.8 oz (94.7 kg)   SpO2 96%   BMI 31.75 kg/m  Vitals:   04/12/18 0839  BP: (!) 138/58  Pulse: 91  Temp: 98.1 F (36.7 C)  TempSrc: Oral  SpO2: 96%  Weight: 208 lb 12.8 oz (94.7 kg)     Physical Exam Constitutional:      Appearance: He is well-developed.  HENT:     Head: Normocephalic and atraumatic.     Right Ear: External ear normal.     Left Ear: External ear normal.     Nose: Nose normal.  Eyes:     General: No scleral icterus.    Conjunctiva/sclera: Conjunctivae normal.  Neck:     Thyroid: No thyromegaly.  Cardiovascular:      Rate and Rhythm: Normal rate and regular rhythm.     Heart sounds: Normal heart sounds.  Pulmonary:     Effort: Pulmonary effort is normal.     Breath sounds: Wheezing present.     Comments: Mild diffuse wheezes. Abdominal:     Palpations: Abdomen is soft.  Lymphadenopathy:     Cervical: No cervical adenopathy.  Skin:    General: Skin is warm and dry.  Neurological:     Mental Status: He is alert and oriented to person, place, and time.     Cranial Nerves: No cranial nerve deficit.     Sensory: No sensory deficit.     Motor: No abnormal muscle tone.     Coordination: Coordination normal.  Psychiatric:        Behavior: Behavior normal.        Thought Content: Thought content normal.        Judgment: Judgment normal.         Assessment & Plan    1. COPD exacerbation ([place]) This time will treat with antibiotics and prednisone taper.  Have advised him to use his nebulizer as needed. - doxycycline (VIBRA-TABS) 100 MG tablet; Take 1 tablet (100 mg total) by mouth 2 (two) times daily.  Dispense: 14 tablet; Refill: 1 - predniSONE (STERAPRED UNI-PAK 21 TAB) 10 MG (21) TBPK tablet; 6 day taper. Take 6 tablets today then 5,4,3,2,1  Dispense: 21 tablet; Refill: 1  2. Type 2 diabetes mellitus without complication, without long-term current use of insulin ([facility])   3. Spondylosis of lumbosacral region without myelopathy or radiculopathy     I have done the exam and reviewed the above chart and it is accurate to the best of my knowledge. Development worker, community has been used in this note in any air is in the dictation or transcription are unintentional.  [name], MD  [place]

## 2018-04-29 ENCOUNTER — Encounter: Payer: Self-pay | Admitting: Emergency Medicine

## 2018-04-29 ENCOUNTER — Other Ambulatory Visit: Payer: Self-pay

## 2018-04-29 ENCOUNTER — Emergency Department
Admission: EM | Admit: 2018-04-29 | Discharge: 2018-04-29 | Disposition: A | Discharge status: Home / Self Care | Payer: Medicare Other | Attending: Emergency Medicine | Admitting: Emergency Medicine

## 2018-04-29 ENCOUNTER — Emergency Department: Payer: Medicare Other

## 2018-04-29 DIAGNOSIS — Z7984 Long term (current) use of oral hypoglycemic drugs: Secondary | ICD-10-CM | POA: Insufficient documentation

## 2018-04-29 DIAGNOSIS — Z87891 Personal history of nicotine dependence: Secondary | ICD-10-CM | POA: Insufficient documentation

## 2018-04-29 DIAGNOSIS — Z79899 Other long term (current) drug therapy: Secondary | ICD-10-CM | POA: Diagnosis not present

## 2018-04-29 DIAGNOSIS — I10 Essential (primary) hypertension: Secondary | ICD-10-CM | POA: Insufficient documentation

## 2018-04-29 DIAGNOSIS — N433 Hydrocele, unspecified: Secondary | ICD-10-CM | POA: Diagnosis not present

## 2018-04-29 DIAGNOSIS — Z7982 Long term (current) use of aspirin: Secondary | ICD-10-CM | POA: Diagnosis not present

## 2018-04-29 DIAGNOSIS — E119 Type 2 diabetes mellitus without complications: Secondary | ICD-10-CM | POA: Insufficient documentation

## 2018-04-29 DIAGNOSIS — N5082 Scrotal pain: Secondary | ICD-10-CM

## 2018-04-29 LAB — URINALYSIS, COMPLETE (UACMP) WITH MICROSCOPIC
Bacteria, UA: NONE SEEN
Bilirubin Urine: NEGATIVE
Glucose, UA: NEGATIVE mg/dL
Hgb urine dipstick: NEGATIVE
Ketones, ur: NEGATIVE mg/dL
Leukocytes,Ua: NEGATIVE
Nitrite: NEGATIVE
Protein, ur: NEGATIVE mg/dL
Specific Gravity, Urine: 1.024 (ref 1.005–1.030)
pH: 6 (ref 5.0–8.0)

## 2018-04-29 MED ORDER — KETOROLAC TROMETHAMINE 30 MG/ML IJ SOLN
15.0000 mg | Freq: Once | INTRAMUSCULAR | Status: AC
  Administered 2018-04-29: 15 mg via INTRAMUSCULAR
  Filled 2018-04-29: qty 1

## 2018-04-29 MED ORDER — TRAMADOL HCL 50 MG PO TABS: 50.0000 mg | ORAL_TABLET | Freq: Four times a day (QID) | ORAL | 0 refills | Status: AC | PRN

## 2018-04-29 MED ORDER — KETOROLAC TROMETHAMINE 30 MG/ML IJ SOLN: 30.0000 mg | Freq: Once | INTRAMUSCULAR | Status: DC

## 2018-04-29 NOTE — ED Notes (Signed)
Pt presents to ED via POV with c/o scrotal pain. Pt c/o pain when palpated by MD. Pt denies dysuria. Pt states hx of prostate removal.

## 2018-04-29 NOTE — ED Triage Notes (Signed)
Patient reports pain in right side of groin since this morning. Denies any swelling. States he had prostate removed in 2003. Denies urinary symptoms.

## 2018-04-29 NOTE — ED Provider Notes (Signed)
[facility] Emergency Department Provider Note   ____________________________________________    I have reviewed the triage vital signs and the nursing notes.   HISTORY  Chief Complaint Testicle Pain     HPI Michael Murphy is a 85 y.o. male who presents with complaints of right scrotal pain.  Patient reports he woke up this morning and felt an aching moderate pain in his right scrotum.  He is never had this before.  He denies dysuria or frequency.  Reports that he had his "prostate removed in 2003 ".  Denies fevers or chills.  No nausea or vomiting.  No penile discharge.  No injury to the area.  Has not take anything for this   Past Medical History:  Diagnosis Date  . Adiposity 07/12/2014  . Allergic rhinitis 02/11/2007  . Arthritis, degenerative 02/11/2007  . Benign essential HTN 02/11/2007   Goal BP< 130/80 due DMII.   Marland Kitchen CA of prostate ([place]) 07/12/2014  . Carpal tunnel syndrome 07/12/2014  . Diabetes mellitus without complication ([place])   . Diabetic neuropathy ([place]) 02/12/2009   s/p Lifestyle Education Diabetic Classes 2012.  Monofilament intact 09/18/2013-normal bilaterally.   . ED (erectile dysfunction) of organic origin 01/23/2013  . Epididymitis 07/12/2014  . Excessive urination at night 01/23/2013  . Hypercholesterolemia without hypertriglyceridemia 02/11/2007  . Hypertension   . Neurosis, posttraumatic 07/12/2014   ([place]) and ([place]).   . OP (osteoporosis) 02/11/2007    Patient Active Problem List   Diagnosis Date Noted  . Type 2 diabetes mellitus without complication, without long-term current use of insulin ([place]) 05/04/2017  . Abnormal laboratory test 07/12/2014  . Carpal tunnel syndrome 07/12/2014  . Adiposity 07/12/2014  . Neurosis, posttraumatic 07/12/2014  . CA of prostate ([place]) 07/12/2014  . Excessive urination at night 01/23/2013  . ED (erectile dysfunction) of organic origin 01/23/2013  . Malignant neoplasm of prostate ([place])  12/07/2011  . Personal history of prostate cancer 12/07/2011  . Diabetic neuropathy ([place]) 02/12/2009  . Allergic rhinitis 02/11/2007  . Benign essential HTN 02/11/2007  . Arthritis, degenerative 02/11/2007  . OP (osteoporosis) 02/11/2007  . Hypercholesterolemia without hypertriglyceridemia 02/11/2007  . Current tobacco use 02/11/2007    Past Surgical History:  Procedure Laterality Date  . CATARACT EXTRACTION    . KNEE ARTHROSCOPY     left  . PROSTATECTOMY    . ROTATOR CUFF REPAIR      Prior to Admission medications   Medication Sig Start Date End Date Taking? Authorizing Provider  albuterol (PROVENTIL HFA;VENTOLIN HFA) 108 (90 Base) MCG/ACT inhaler Inhale 2 puffs into the lungs every 6 (six) hours as needed for wheezing or shortness of breath. 03/11/17   Jerrol Banana., MD  alendronate (FOSAMAX) 70 MG tablet TAKE 1 TABLET ONCE A WEEK WITH A FULL GLASS OF WATER ON AN EMPTY STOMACH 04/12/18   Jerrol Banana., MD  amLODipine (NORVASC) 10 MG tablet Take 1 tablet by mouth daily. 10/15/10   [provider]  aspirin 81 MG chewable tablet Chew by mouth daily.    [provider]  calcium carbonate (CALCIUM 600) 600 MG TABS tablet Take 600 mg by mouth daily. 12/07/11   [provider]  doxycycline (VIBRA-TABS) 100 MG tablet Take 1 tablet (100 mg total) by mouth 2 (two) times daily. 04/12/18   Jerrol Banana., MD  FLAXSEED, LINSEED, PO Take 2 capsules by mouth daily. 02/25/10   [provider]  Garlic Oil 7829  MG CAPS Take 1 capsule by mouth daily. 04/14/07   [provider]  hydroxychloroquine (PLAQUENIL) 200 MG tablet Take 400 mg by mouth daily.  07/04/17   [provider]  losartan (COZAAR) 50 MG tablet Take 50 mg by mouth daily.    [provider]  LUBRICATING PLUS EYE DROPS 0.5 % SOLN  12/13/17   [provider]  metFORMIN (GLUCOPHAGE) 500 MG tablet Take 1 tablet by mouth daily.    [provider]    Multiple Vitamins-Minerals (MENS MULTIVITAMIN PLUS PO) Take by mouth daily.    [provider]  Omega-3 Fatty Acids (FISH OIL) 1000 MG CAPS Take 1 capsule by mouth daily. 04/14/07   [provider]  predniSONE (STERAPRED UNI-PAK 21 TAB) 10 MG (21) TBPK tablet 6 day taper. Take 6 tablets today then 5,4,3,2,1 04/12/18   Jerrol Banana., MD  simvastatin (ZOCOR) 20 MG tablet Take 1 tablet by mouth at bedtime.    [provider]  [facility] 160-4.5 MCG/ACT inhaler  04/06/18   [provider]  terazosin (HYTRIN) 10 MG capsule Take 10 mg by mouth at bedtime.  10/22/16   [provider]  tiotropium (SPIRIVA HANDIHALER) 18 MCG inhalation capsule Place 18 mcg into inhaler and inhale every morning.    [provider]  traMADol (ULTRAM) 50 MG tablet Take 1 tablet (50 mg total) by mouth every 6 (six) hours as needed. 04/29/18 04/29/19  Lavonia Drafts, MD     Allergies Accupril  [place] hcl] and Quinapril  Family History  Problem Relation Age of Onset  . Hypertension Mother   . Stroke Mother   . Diabetes Sister   . Hypertension Sister   . Diabetes Brother   . Diabetes Brother     Social History Social History   Tobacco Use  . Smoking status: Former Smoker    Packs/day: 0.50    Years: 18.00    Pack years: 9.00    Types: Cigarettes    Last attempt to quit: 03/09/1991    Years since quitting: 27.1  . Smokeless tobacco: Never Used  Substance Use Topics  . Alcohol use: Yes    Alcohol/week: 2.0 - 3.0 standard drinks    Types: 2 - 3 Cans of beer per week  . Drug use: No    Review of Systems  Constitutional: No fever/chills Eyes: No visual changes.  ENT: No sore throat. Cardiovascular: Denies chest pain. Respiratory: Denies shortness of breath. Gastrointestinal: As above Genitourinary: As above Musculoskeletal: Negative for back pain. Skin: Negative for rash. Neurological: Negative for headaches     ____________________________________________   PHYSICAL EXAM:  VITAL SIGNS: ED Triage Vitals  Enc Vitals Group     BP 04/29/18 1739 (!) 162/73     Pulse Rate 04/29/18 1739 92     Resp 04/29/18 1739 18     Temp 04/29/18 1739 98.2 F (36.8 C)     Temp Source 04/29/18 1739 Oral     SpO2 04/29/18 1739 96 %     Weight 04/29/18 1740 90.7 kg (200 lb)     Height 04/29/18 1740 1.727 m (5\' 8" )     Head Circumference --      Peak Flow --      Pain Score 04/29/18 1739 8     Pain Loc --      Pain Edu? --      Excl. in [place]? --     Constitutional: Alert and oriented.  Eyes: Conjunctivae are  normal.   Nose: No congestion/rhinnorhea. Mouth/Throat: Mucous membranes are moist.    Cardiovascular: Normal rate, regular rhythm. Grossly normal heart sounds.  Good peripheral circulation. Respiratory: Normal respiratory effort.  No retractions. Lungs CTAB. Gastrointestinal: Soft and nontender. No distention.  No CVA tenderness. Genitourinary: Tenderness to palpation of the right testicle primarily along the lateral edge, no significant swelling noted, no penile discharge no erythema or rash or abscesses Musculoskeletal: N  Warm and well perfused Neurologic:  Normal speech and language. No gross focal neurologic deficits are appreciated.  Skin:  Skin is warm, dry and intact. No rash noted. Psychiatric: Mood and affect are normal. Speech and behavior are normal.  ____________________________________________   LABS (all labs ordered are listed, but only abnormal results are displayed)  Labs Reviewed  URINALYSIS, COMPLETE (UACMP) WITH MICROSCOPIC - Abnormal; Notable for the following components:      Result Value   Color, Urine YELLOW (*)    APPearance CLEAR (*)    All other components within normal limits    ____________________________________________  EKG  None ____________________________________________  RADIOLOGY  None ____________________________________________   PROCEDURES  Procedure(s) performed: No  Procedures   Critical Care performed: No ____________________________________________   INITIAL IMPRESSION / ASSESSMENT AND PLAN / ED COURSE  Pertinent labs & imaging results that were available during my care of the patient were reviewed by me and considered in my medical decision making (see chart for details).  Patient presents with right scrotal pain as noted above, does have a history of diabetes.  Will obtain urinalysis, ultrasound and reevaluate  Ultrasound demonstrates a complex right hydrocele, patient treated with IM Toradol, follow-up with urology    ____________________________________________   FINAL CLINICAL IMPRESSION(S) / ED DIAGNOSES  Final diagnoses:  Hydrocele in adult        Note:  This document was prepared using Dragon voice recognition software and may include unintentional dictation errors.   Lavonia Drafts, MD 04/29/18 2115

## 2018-05-02 ENCOUNTER — Ambulatory Visit (INDEPENDENT_AMBULATORY_CARE_PROVIDER_SITE_OTHER): Payer: Medicare Other | Admitting: Urology

## 2018-05-02 ENCOUNTER — Encounter: Payer: Self-pay | Admitting: Urology

## 2018-05-02 VITALS — BP 148/66 | HR 102 | Ht 68.0 in | Wt 200.0 lb

## 2018-05-02 DIAGNOSIS — N4340 Spermatocele of epididymis, unspecified: Secondary | ICD-10-CM | POA: Insufficient documentation

## 2018-05-02 DIAGNOSIS — N5082 Scrotal pain: Secondary | ICD-10-CM | POA: Diagnosis not present

## 2018-05-02 DIAGNOSIS — N433 Hydrocele, unspecified: Secondary | ICD-10-CM | POA: Diagnosis not present

## 2018-05-02 MED ORDER — CELECOXIB 100 MG PO CAPS: 100.0000 mg | ORAL_CAPSULE | Freq: Two times a day (BID) | ORAL | 1 refills | Status: DC

## 2018-05-02 NOTE — Progress Notes (Signed)
05/02/2018  11:27 AM   Michael Murphy 1927-11-05 [id number]  Referring provider: Jerrol Murphy., MD [address] [place], [place] [address]  Chief Complaint  Patient presents with  . Hydrocele   HPI: Michael Murphy is a 85 y.o. Black or [place] American male that presents today for  - Recently seen in ER (04/29/2018) for right sided testicular pain with hydrocele which was acute in onset  - Alleviating factors: Tramadol 50 mg  -Scrotal ultrasound remarkable for a 4 cm right epididymal cyst and large right hydrocele.  Small left epididymal cyst/left hydrocele noted  -Pain has continued.    PMH: Past Medical History:  Diagnosis Date  . Adiposity 07/12/2014  . Allergic rhinitis 02/11/2007  . Arthritis, degenerative 02/11/2007  . Benign essential HTN 02/11/2007   Goal BP< 130/80 due DMII.   Marland Kitchen CA of prostate ([place]) 07/12/2014  . Carpal tunnel syndrome 07/12/2014  . Diabetes mellitus without complication ([place])   . Diabetic neuropathy ([place]) 02/12/2009   s/p Lifestyle Education Diabetic Classes 2012.  Monofilament intact 09/18/2013-normal bilaterally.   . ED (erectile dysfunction) of organic origin 01/23/2013  . Epididymitis 07/12/2014  . Excessive urination at night 01/23/2013  . Hypercholesterolemia without hypertriglyceridemia 02/11/2007  . Hypertension   . Neurosis, posttraumatic 07/12/2014   ([place]) and ([place]).   . OP (osteoporosis) 02/11/2007    Surgical History: Past Surgical History:  Procedure Laterality Date  . CATARACT EXTRACTION    . KNEE ARTHROSCOPY     left  . PROSTATECTOMY    . ROTATOR CUFF REPAIR      Home Medications:  Allergies as of 05/02/2018      Reactions   Accupril  [quinapril Hcl] Swelling   throat swells   Quinapril Swelling      Medication List       Accurate as of May 02, 2018 11:27 AM. Always use your most recent med list.        albuterol 108 (90 Base) MCG/ACT inhaler Commonly known as:  PROVENTIL HFA;VENTOLIN  HFA Inhale 2 puffs into the lungs every 6 (six) hours as needed for wheezing or shortness of breath.   alendronate 70 MG tablet Commonly known as:  FOSAMAX TAKE 1 TABLET ONCE A WEEK WITH A FULL GLASS OF WATER ON AN EMPTY STOMACH   amLODipine 10 MG tablet Commonly known as:  NORVASC Take 1 tablet by mouth daily.   aspirin 81 MG chewable tablet Chew by mouth daily.   CALCIUM 600 600 MG Tabs tablet Generic drug:  calcium carbonate Take 600 mg by mouth daily.   doxycycline 100 MG tablet Commonly known as:  VIBRA-TABS Take 1 tablet (100 mg total) by mouth 2 (two) times daily.   Fish Oil 1000 MG Caps Take 1 capsule by mouth daily.   FLAXSEED (LINSEED) PO Take 2 capsules by mouth daily.   Garlic Oil 5053 MG Caps Take 1 capsule by mouth daily.   hydroxychloroquine 200 MG tablet Commonly known as:  PLAQUENIL Take 400 mg by mouth daily.   losartan 50 MG tablet Commonly known as:  COZAAR Take 50 mg by mouth daily.   LUBRICATING PLUS EYE DROPS 0.5 % Soln Generic drug:  Carboxymethylcellulose Sod PF   MENS MULTIVITAMIN PLUS PO Take by mouth daily.   metFORMIN 500 MG tablet Commonly known as:  GLUCOPHAGE Take 1 tablet by mouth daily.   predniSONE 10 MG (21) Tbpk tablet Commonly known as:  STERAPRED UNI-PAK 21 TAB 6 day  taper. Take 6 tablets today then 5,4,3,2,1   simvastatin 20 MG tablet Commonly known as:  ZOCOR Take 1 tablet by mouth at bedtime.   SPIRIVA HANDIHALER 18 MCG inhalation capsule Generic drug:  tiotropium Place 18 mcg into inhaler and inhale every morning.   SYMBICORT 160-4.5 MCG/ACT inhaler Generic drug:  budesonide-formoterol   terazosin 10 MG capsule Commonly known as:  HYTRIN Take 10 mg by mouth at bedtime.   traMADol 50 MG tablet Commonly known as:  ULTRAM Take 1 tablet (50 mg total) by mouth every 6 (six) hours as needed.       Allergies:  Allergies  Allergen Reactions  . Accupril  [Quinapril Hcl] Swelling    throat swells  .  Quinapril Swelling    Family History: Family History  Problem Relation Age of Onset  . Hypertension Mother   . Stroke Mother   . Diabetes Sister   . Hypertension Sister   . Diabetes Brother   . Diabetes Brother     Social History:  reports that he quit smoking about 27 years ago. His smoking use included cigarettes. He has a 9.00 pack-year smoking history. He has never used smokeless tobacco. He reports current alcohol use of about 2.0 - 3.0 standard drinks of alcohol per week. He reports that he does not use drugs.  ROS: UROLOGY Frequent Urination?: No Hard to postpone urination?: No Burning/pain with urination?: No Get up at night to urinate?: Yes Leakage of urine?: No Urine stream starts and stops?: No Trouble starting stream?: Yes Do you have to strain to urinate?: No Blood in urine?: No Urinary tract infection?: No Sexually transmitted disease?: No Injury to kidneys or bladder?: No Painful intercourse?: No Weak stream?: No Erection problems?: No Penile pain?: No  Gastrointestinal Nausea?: No Vomiting?: No Indigestion/heartburn?: No Diarrhea?: No Constipation?: No  Constitutional Fever: No Night sweats?: No Weight loss?: No Fatigue?: No  Skin Skin rash/lesions?: No Itching?: No  Eyes Blurred vision?: No Double vision?: No  Ears/Nose/Throat Sore throat?: No Sinus problems?: No  Hematologic/Lymphatic Swollen glands?: No Easy bruising?: No  Cardiovascular Leg swelling?: No Chest pain?: No  Respiratory Cough?: No Shortness of breath?: Yes  Endocrine Excessive thirst?: No  Musculoskeletal Back pain?: No Joint pain?: No  Neurological Headaches?: No Dizziness?: No  Psychologic Depression?: No Anxiety?: No  Physical Exam: BP (!) 148/66 (BP Location: Left Arm, Patient Position: Sitting, Cuff Size: Normal)   Pulse (!) 102   Ht 5\' 8"  (1.727 m)   Wt 200 lb (90.7 kg)   BMI 30.41 kg/m   Constitutional:  Alert and oriented, No acute  distress. Respiratory: Normal respiratory effort, no increased work of breathing. Head: Normocephalic and Atraumatic. GU: No CVA tenderness. Approximately 4 cm cystic mass on right hemiscrotum, mildly tender. Skin: No rashes, bruises or suspicious lesions. Neurologic: Grossly intact, no focal deficits, moving all 4 extremities. Psychiatric: Normal mood and affect.   Pertinent Imaging: US Scrotum W/doppler  Result Date: 04/29/2018 CLINICAL DATA:  Initial evaluation for right-sided scrotal pain. History of prior prostatectomy. EXAM: SCROTAL ULTRASOUND DOPPLER ULTRASOUND OF THE TESTICLES TECHNIQUE: Complete ultrasound examination of the testicles, epididymis, and other scrotal structures was performed. Color and spectral Doppler ultrasound were also utilized to evaluate blood flow to the testicles. COMPARISON:  None. FINDINGS: Right testicle Measurements: 4.8 x 3.6 x 2.9 cm. No mass or microlithiasis visualized. Tubular ectasia of the rete testes noted. Left testicle Measurements: 4.4 x 3.0 x 3.3 cm. No mass or microlithiasis visualized. Right epididymis:  Large cyst present at the right epididymal head measures 4.4 x 2.2 x 2.5 cm. Cyst is predominantly simple, although a few scattered low-level internal echoes noted. Visualized right epididymis otherwise normal. Left epididymis: Epididymal cyst measuring approximately 16 mm in size present at the left epididymal head. Visualized left epididymis otherwise normal. Hydrocele: Large right-sided hydrocele, mildly complex with internal free-floating low-level echoes. Small left-sided hydrocele noted as well. Varicocele:  None visualized. Pulsed Doppler interrogation of both testes demonstrates normal low resistance arterial and venous waveforms bilaterally. IMPRESSION: 1. Large mildly complex right-sided hydrocele, with additional small simple left-sided hydrocele. 2. 4.4 cm cyst present at the right epididymal head, most likely reflecting a benign epididymal  cyst/spermatocele. 3. Additional 1.6 cm left epididymal head cyst. 4. No other acute abnormality identified.  No evidence for torsion. Electronically Signed   By: Jeannine Boga M.D.   On: 04/29/2018 19:43   I have personally reviewed all the images today.  Assessment & Plan:   1. Scrotal Pain  - Discussed with patient that hydrocele/epididymal cysts do typically not cause acute pain to the degree that he is having.  - Hydrocelectomy/spermatocelectomy could be performed however he was informed there is no guarantee this would resolve his pain.  - He desires to continue observation for now  - Plan for symptom reassessment and further discussion of surgical management, if he decides to pursue, in 1 month  - Rx NSAID was sent to [facility] [place] [address], [place], [place] [address] [phone]  I, Temidayo Atanda-[place] , am acting as a scribe for Abbie Sons, MD  I, Abbie Sons, MD, have reviewed all documentation for this visit. The documentation on 05/02/18 for the exam, diagnosis, procedures, and orders are all accurate and complete.

## 2018-05-02 NOTE — Patient Instructions (Signed)
Hydrocele, Adult  A hydrocele is a collection of fluid in the loose pouch of skin that holds the testicles (scrotum). This may happen because:  The amount of fluid produced in the scrotum is not absorbed by the rest of the body.  Fluid from the abdomen fills the scrotum. Normally, the testicles develop in the abdomen then move (drop) into to the scrotum before birth. The tube that the testicles travel through usually closes after the testicles drop. If the tube does not close, fluid from the abdomen can fill the scrotum. This is less common in adults.  What are the causes?  The cause of a hydrocele in adults is usually not known. However, it may be caused by:  An injury to the scrotum.  An infection (epididymitis).  Decreased blood flow to the scrotum.  Twisting of a testicle (testicular torsion).  A birth defect.  A tumor or cancer of the testicle.  What are the signs or symptoms?  A hydrocele feels like a water-filled balloon. It may also feel heavy. Other symptoms include:  Swelling of the scrotum. The swelling may decrease when you lie down. You may also notice more swelling at night than in the morning.  Swelling of the groin.  Mild discomfort in the scrotum.  Pain. This can develop if the hydrocele was caused by infection or twisting. The larger the hydrocele, the more likely you are to have pain.  How is this diagnosed?  This condition may be diagnosed based on:  Physical exam.  Medical history.  You may also have other tests, including:  Imaging tests, such as ultrasound.  Blood or urine tests.  How is this treated?  Most hydroceles go away on their own. If you have no discomfort or pain, your health care provider may suggest close monitoring of your condition (called watch and wait or watchful waiting) until the condition goes away or symptoms develop. If treatment is needed, it may include:  Treating an underlying condition. This may include using an antibiotic medicine to treat an infection.  Surgery to  stop fluid from collecting in the scrotum.  Surgery to drain the fluid. Options include:  Needle aspiration. A needle is used to drain fluid. However, the fluid buildup will come back quickly.  Hydrocelectomy. For this procedure, an incision is made in the scrotum to remove the fluid sac.  Follow these instructions at home:  Watch the hydrocele for any changes.  Take over-the-counter and prescription medicines only as told by your health care provider.  If you were prescribed an antibiotic medicine, use it as told by your health care provider. Do not stop taking the antibiotic even if you start to feel better.  Keep all follow-up visits as told by your health care provider. This is important.  Contact a health care provider if:  You notice any changes in the hydrocele.  The swelling in your scrotum or groin gets worse.  The hydrocele becomes red, firm, painful, or tender to the touch.  You have a fever.  Get help right away if you:  Develop a lot of pain, or your pain becomes worse.  Summary  A hydrocele is a collection of fluid in the loose pouch of skin that holds the testicles (scrotum).  Hydroceles can cause swelling, discomfort, and sometimes pain.  In adults, the cause of a hydrocele usually is not known. However, it is sometimes caused by an infection or a rotation and twisting of the scrotum.  Treatment is usually not   needed. Hydroceles often go away on their own. If a hydrocele causes pain, treatment may be given to ease the pain.  This information is not intended to replace advice given to you by your health care provider. Make sure you discuss any questions you have with your health care provider.  Document Released: 08/13/2009 Document Revised: 03/06/2017 Document Reviewed: 03/06/2017  Elsevier Interactive Patient Education  2019 [employer] Inc[employer]

## 2018-05-16 DIAGNOSIS — E119 Type 2 diabetes mellitus without complications: Secondary | ICD-10-CM | POA: Diagnosis not present

## 2018-05-16 DIAGNOSIS — B351 Tinea unguium: Secondary | ICD-10-CM | POA: Diagnosis not present

## 2018-06-13 ENCOUNTER — Ambulatory Visit: Payer: Medicare Other | Admitting: Urology

## 2018-06-16 ENCOUNTER — Other Ambulatory Visit: Payer: Self-pay

## 2018-06-16 ENCOUNTER — Telehealth (INDEPENDENT_AMBULATORY_CARE_PROVIDER_SITE_OTHER): Payer: Medicare Other | Admitting: Urology

## 2018-06-16 DIAGNOSIS — N50811 Right testicular pain: Secondary | ICD-10-CM

## 2018-06-16 NOTE — Progress Notes (Signed)
Virtual Visit via Telephone Note  I connected with [name] on 06/16/18 at 10:00 AM EDT by telephone and verified that I am speaking with the correct person using two identifiers.   I discussed the limitations, risks, security and privacy concerns of performing an evaluation and management service by telephone and the availability of in person appointments. We discussed the impact of the COVID-19 on the healthcare system, and the importance of social distancing and reducing patient and provider exposure. I also discussed with the patient that there may be a patient responsible charge related to this service. The patient expressed understanding and agreed to proceed.  Reason for visit: Telephone follow-up secondary to COVID-19  History of Present Illness: 85 year old male who I saw on 05/02/2018 with right scrotal content pain.  He had a 4 cm right spermatocele and large right hydrocele which were not tender.  Scrotal ultrasound showed no other abnormalities.  We discussed that his scrotal findings do not typically cause acute scrotal pain and he was started on NSAIDs and a one-month follow-up was scheduled.  He states he is doing very well and his pain has completely resolved.  He has no other complaints.  Assessment and Plan: Right spermatocele/hydrocele with scrotal pain-resolved  Follow Up: He has a follow-up scheduled on 01/10/2019 and will keep that visit.  Instructed to call earlier for recurrent pain.   I discussed the assessment and treatment plan with the patient. The patient was provided an opportunity to ask questions and all were answered. The patient agreed with the plan and demonstrated an understanding of the instructions.   The patient was advised to call back or seek an in-person evaluation if the symptoms worsen or if the condition fails to improve as anticipated.  I provided 5 minutes of non-face-to-face time during this encounter.   Abbie Sons, MD

## 2018-07-08 ENCOUNTER — Telehealth: Payer: Self-pay

## 2018-07-08 NOTE — Telephone Encounter (Signed)
Patient states that he was taken off the Fosamax after bone density test.

## 2018-07-08 NOTE — Telephone Encounter (Signed)
ok 

## 2018-07-20 ENCOUNTER — Ambulatory Visit: Payer: Self-pay | Admitting: Family Medicine

## 2018-08-16 DIAGNOSIS — B351 Tinea unguium: Secondary | ICD-10-CM | POA: Diagnosis not present

## 2018-08-16 DIAGNOSIS — E119 Type 2 diabetes mellitus without complications: Secondary | ICD-10-CM | POA: Diagnosis not present

## 2018-08-30 ENCOUNTER — Ambulatory Visit (INDEPENDENT_AMBULATORY_CARE_PROVIDER_SITE_OTHER): Payer: Medicare Other

## 2018-08-30 ENCOUNTER — Other Ambulatory Visit: Payer: Self-pay

## 2018-08-30 DIAGNOSIS — Z0000 Encounter for general adult medical examination without abnormal findings: Secondary | ICD-10-CM | POA: Diagnosis not present

## 2018-08-30 NOTE — Progress Notes (Signed)
Subjective:   Michael Murphy is a 85 y.o. male who presents for Medicare Annual/Subsequent preventive examination.    This visit is being conducted through telemedicine due to the COVID-19 pandemic. This patient has given me verbal consent via doximity to conduct this visit, patient states they are participating from their home address. Some vital signs may be absent or patient reported.    Patient identification: identified by name, DOB, and current address  Review of Systems:  N/A  Cardiac Risk Factors include: advanced age (>65men, >29 women);diabetes mellitus;dyslipidemia;hypertension;male gender;obesity (BMI >30kg/m2)     Objective:    Vitals: There were no vitals taken for this visit.  There is no height or weight on file to calculate BMI. Unable to obtain vitals due to visit being conducted via telephonically.   Advanced Directives 08/30/2018 04/29/2018 03/10/2018 08/27/2017 03/09/2017 08/04/2016 03/14/2016  Does Patient Have a Medical Advance Directive? Yes No No Yes No No No  Type of Advance Directive Living will - - Living will - - -  Would patient like information on creating a medical advance directive? - - - - No - Patient declined No - Patient declined No - Patient declined    Tobacco Social History   Tobacco Use  Smoking Status Former Smoker  . Packs/day: 0.50  . Years: 18.00  . Pack years: 9.00  . Types: Cigarettes  . Quit date: 03/09/1991  . Years since quitting: 27.4  Smokeless Tobacco Never Used     Counseling given: Not Answered   Clinical Intake:  Pre-visit preparation completed: Yes  Pain Score: 0-No pain     Nutritional Risks: None Diabetes: Yes  How often do you need to have someone help you when you read instructions, pamphlets, or other written materials from your doctor or pharmacy?: 1 - Never   Diabetes:  Is the patient diabetic?  Yes type 2 If diabetic, was a CBG obtained today?  No  Did the patient bring in their glucometer from home?   No  How often do you monitor your CBG's? Once a week.   Financial Strains and Diabetes Management:  Are you having any financial strains with the device, your supplies or your medication? No .  Does the patient want to be seen by Chronic Care Management for management of their diabetes?  No  Would the patient like to be referred to a Nutritionist or for Diabetic Management?  No   Diabetic Exams:  Diabetic Eye Exam: Completed 05/2017 per pt. Overdue for diabetic eye exam. Pt has been advised about the importance in completing this exam. Pt plans to set up an eye exam this year.   Diabetic Foot Exam: Completed 09/18/13. Pt has been advised about the importance in completing this exam.  Note made to follow up on this at next in office visit.    Interpreter Needed?: No  Information entered by :: [facility], LPN  Past Medical History:  Diagnosis Date  . Adiposity 07/12/2014  . Allergic rhinitis 02/11/2007  . Arthritis, degenerative 02/11/2007  . Benign essential HTN 02/11/2007   Goal BP< 130/80 due DMII.   Marland Kitchen CA of prostate ([place]) 07/12/2014  . Carpal tunnel syndrome 07/12/2014  . Diabetes mellitus without complication ([place])   . Diabetic neuropathy ([place]) 02/12/2009   s/p Lifestyle Education Diabetic Classes 2012.  Monofilament intact 09/18/2013-normal bilaterally.   . ED (erectile dysfunction) of organic origin 01/23/2013  . Epididymitis 07/12/2014  . Excessive urination at night 01/23/2013  . Hypercholesterolemia without hypertriglyceridemia 02/11/2007  .  Hypertension   . Neurosis, posttraumatic 07/12/2014   ([place] [facility]) and ([place]).   . OP (osteoporosis) 02/11/2007   Past Surgical History:  Procedure Laterality Date  . CATARACT EXTRACTION    . KNEE ARTHROSCOPY     left  . MULTIPLE TOOTH EXTRACTIONS    . PROSTATECTOMY    . ROTATOR CUFF REPAIR     Family History  Problem Relation Age of Onset  . Hypertension Mother   . Stroke Mother   . Diabetes Sister   . Hypertension Sister   .  Diabetes Brother   . Diabetes Brother    Social History   Socioeconomic History  . Marital status: Widowed    Spouse name: Not on file  . Number of children: 8  . Years of education: Not on file  . Highest education level: GED or equivalent  Occupational History  . Occupation: retired  Scientific laboratory technician  . Financial resource strain: Not hard at all  . Food insecurity    Worry: Never true    Inability: Never true  . Transportation needs    Medical: No    Non-medical: No  Tobacco Use  . Smoking status: Former Smoker    Packs/day: 0.50    Years: 18.00    Pack years: 9.00    Types: Cigarettes    Quit date: 03/09/1991    Years since quitting: 27.4  . Smokeless tobacco: Never Used  Substance and Sexual Activity  . Alcohol use: Yes    Alcohol/week: 1.0 - 3.0 standard drinks    Types: 1 - 3 Cans of beer per week  . Drug use: No  . Sexual activity: Yes    Birth control/protection: None  Lifestyle  . Physical activity    Days per week: 0 days    Minutes per session: 0 min  . Stress: Not at all  Relationships  . Social Herbalist on phone: Patient refused    Gets together: Patient refused    Attends religious service: Patient refused    Active member of club or organization: Patient refused    Attends meetings of clubs or organizations: Patient refused    Relationship status: Patient refused  Other Topics Concern  . Not on file  Social History Narrative  . Not on file    Outpatient Encounter Medications as of 08/30/2018  Medication Sig  . albuterol (PROVENTIL HFA;VENTOLIN HFA) 108 (90 Base) MCG/ACT inhaler Inhale 2 puffs into the lungs every 6 (six) hours as needed for wheezing or shortness of breath.  Marland Kitchen amLODipine (NORVASC) 10 MG tablet Take 1 tablet by mouth daily.  Marland Kitchen aspirin 81 MG chewable tablet Chew by mouth daily.  . calcium carbonate (CALCIUM 600) 600 MG TABS tablet Take 600 mg by mouth daily.  Marland Kitchen FLAXSEED, LINSEED, PO Take 2 capsules by mouth daily.  .  fluticasone (FLONASE) 50 MCG/ACT nasal spray Place 1 spray into both nostrils as needed.   . Garlic Oil 8341 MG CAPS Take 1 capsule by mouth daily.  Marland Kitchen losartan (COZAAR) 50 MG tablet Take 50 mg by mouth daily.  . LUBRICATING PLUS EYE DROPS 0.5 % SOLN as needed.   . metFORMIN (GLUCOPHAGE) 500 MG tablet Take 1 tablet by mouth daily.  . Multiple Vitamins-Minerals (MENS MULTIVITAMIN PLUS PO) Take by mouth daily.  . Omega-3 Fatty Acids (FISH OIL) 1000 MG CAPS Take 1 capsule by mouth daily.  . simvastatin (ZOCOR) 20 MG tablet Take 1 tablet by mouth at bedtime.  Marland Kitchen  SYMBICORT 160-4.5 MCG/ACT inhaler Inhale 2 puffs into the lungs 2 (two) times daily.   Marland Kitchen tiotropium (SPIRIVA HANDIHALER) 18 MCG inhalation capsule Place 18 mcg into inhaler and inhale every morning.  . traMADol (ULTRAM) 50 MG tablet Take 1 tablet (50 mg total) by mouth every 6 (six) hours as needed.  Marland Kitchen alendronate (FOSAMAX) 70 MG tablet TAKE 1 TABLET ONCE A WEEK WITH A FULL GLASS OF WATER ON AN EMPTY STOMACH (Patient not taking: Reported on 08/30/2018)  . celecoxib (CELEBREX) 100 MG capsule Take 1 capsule (100 mg total) by mouth 2 (two) times daily. (Patient not taking: Reported on 08/30/2018)  . doxycycline (VIBRA-TABS) 100 MG tablet Take 1 tablet (100 mg total) by mouth 2 (two) times daily. (Patient not taking: Reported on 08/30/2018)  . hydroxychloroquine (PLAQUENIL) 200 MG tablet Take 400 mg by mouth daily.   . predniSONE (STERAPRED UNI-PAK 21 TAB) 10 MG (21) TBPK tablet 6 day taper. Take 6 tablets today then 5,4,3,2,1 (Patient not taking: Reported on 08/30/2018)  . terazosin (HYTRIN) 10 MG capsule Take 10 mg by mouth at bedtime.    No facility-administered encounter medications on file as of 08/30/2018.     Activities of Daily Living In your present state of health, do you have any difficulty performing the following activities: 08/30/2018  Hearing? N  Vision? N  Difficulty concentrating or making decisions? N  Walking or climbing stairs?  Y  Comment Due to leg pains and SOB.  Dressing or bathing? N  Doing errands, shopping? N  Preparing Food and eating ? N  Using the Toilet? N  In the past six months, have you accidently leaked urine? N  Do you have problems with loss of bowel control? N  Managing your Medications? N  Managing your Finances? N  Housekeeping or managing your Housekeeping? N  Some recent data might be hidden    Patient Care Team: Jerrol Banana., MD as PCP - General (Family Medicine) Sharlotte Alamo, DPM as Consulting Physician (Podiatry) Lendon Collar, MD as Referring Physician (Family Medicine)   Assessment:   This is a routine wellness examination for [place].  Exercise Activities and Dietary recommendations Current Exercise Habits: The patient does not participate in regular exercise at present, Exercise limited by: None identified  Goals    . DIET - INCREASE WATER INTAKE     Recommend increasing water intake to 6 glasses a day.     . Eat more fruits and vegetables     Recommend increasing fruits and vegetables in diet to at least two servings a day.       Fall Risk Fall Risk  08/30/2018 08/27/2017 08/04/2016 11/06/2015 08/22/2014  Falls in the past year? 0 Yes No Yes No  Comment - - - Emmi Telephone Survey: data to providers prior to load -  Number falls in past yr: - 1 - 1 -  Comment - - - Emmi Telephone Survey Actual Response = 1 -  Injury with Fall? - No - Yes -  Follow up - Falls prevention discussed - - -   FALL RISK PREVENTION PERTAINING TO THE HOME:  Any stairs in or around the home? Yes  If so, are there any without handrails? No   Home free of loose throw rugs in walkways, pet beds, electrical cords, etc? Yes  Adequate lighting in your home to reduce risk of falls? Yes   ASSISTIVE DEVICES UTILIZED TO PREVENT FALLS:  Life alert? No  Use of a cane, walker  or w/c? Yes Grab bars in the bathroom? Yes  Shower chair or bench in shower? Yes  Elevated toilet seat or a  handicapped toilet? No    TIMED UP AND GO:  Was the test performed? No .    Depression Screen PHQ 2/9 Scores 08/30/2018 08/27/2017 08/04/2016 08/22/2014  PHQ - 2 Score 0 2 5 0  PHQ- 9 Score - 6 18 -    Cognitive Function: Declined today.      6CIT Screen 08/04/2016  What Year? 0 points  What month? 0 points  What time? 0 points  Count back from 20 0 points  Months in reverse 0 points  Repeat phrase 2 points  Total Score 2    Immunization History  Administered Date(s) Administered  . Influenza Split 11/24/2008, 12/03/2009, 12/26/2010  . Influenza, High Dose Seasonal PF 12/03/2015, 12/01/2016  . Influenza-Unspecified 12/08/2014  . Pneumococcal Conjugate-13 08/22/2014  . Pneumococcal Polysaccharide-23 12/01/2016  . Tdap 12/26/2010    Qualifies for Shingles Vaccine? Yes . Due for Shingrix. Education has been provided regarding the importance of this vaccine. Pt has been advised to call insurance company to determine out of pocket expense. Advised may also receive vaccine at local pharmacy or Health Dept. Verbalized acceptance and understanding.  Tdap: Up to date  Flu Vaccine: Due fall 2020  Pneumococcal Vaccine: Completed series  Screening Tests Health Maintenance  Topic Date Due  . FOOT EXAM  02/18/1938  . OPHTHALMOLOGY EXAM  09/06/2016  . HEMOGLOBIN A1C  08/10/2018  . INFLUENZA VACCINE  10/08/2018  . TETANUS/TDAP  12/25/2020  . PNA vac Low Risk Adult  Completed   Cancer Screenings:  Colorectal Screening: No longer required.   Lung Cancer Screening: (Low Dose CT Chest recommended if Age 28-80 years, 30 pack-year currently smoking OR have quit w/in 15years.) does not qualify.   Additional Screening:  Dental Screening: Recommended annual dental exams for proper oral hygiene  Community Resource Referral:  CRR required this visit?  No        Plan:  I have personally reviewed and addressed the Medicare Annual Wellness questionnaire and have noted the  following in the patient's chart:  A. Medical and social history B. Use of alcohol, tobacco or illicit drugs  C. Current medications and supplements D. Functional ability and status E.  Nutritional status F.  Physical activity G. Advance directives H. List of other physicians I.  Hospitalizations, surgeries, and ER visits in previous 12 months J.  [place] such as hearing and vision if needed, cognitive and depression L. Referrals and appointments   In addition, I have reviewed and discussed with patient certain preventive protocols, quality metrics, and best practice recommendations. A written personalized care plan for preventive services as well as general preventive health recommendations were provided to patient.   Glendora Score, [place]  [id number] Nurse Health Advisor  Nurse Notes: Pt needs a Hgb A1c check and a diabetic foot exam at next in office visit. Pt has an eye exam scheduled this year. Requested records be faxed to clinic.

## 2018-08-30 NOTE — Patient Instructions (Signed)
Michael Murphy , Thank you for taking time to come for your Medicare Wellness Visit. I appreciate your ongoing commitment to your health goals. Please review the following plan we discussed and let me know if I can assist you in the future.   Screening recommendations/referrals: Colonoscopy: No longer required.  Recommended yearly ophthalmology/optometry visit for glaucoma screening and checkup Recommended yearly dental visit for hygiene and checkup  Vaccinations: Influenza vaccine: Due fall 2020 Pneumococcal vaccine: Completed series Tdap vaccine: Up to date, due 12/2020 Shingles vaccine: Pt declines today.     Advanced directives: Please bring a copy of your POA (Power of Attorney) and/or Living Will to your next appointment.   Conditions/risks identified: Continue to increase water, fruit and vegetable consumption in daily diet.   Next appointment: 09/05/18 @ 11:00 AM with Dr Rosanna Randy.   Preventive Care 2 Years and Older, Male Preventive care refers to lifestyle choices and visits with your health care provider that can promote health and wellness. What does preventive care include?  A yearly physical exam. This is also called an annual well check.  Dental exams once or twice a year.  Routine eye exams. Ask your health care provider how often you should have your eyes checked.  Personal lifestyle choices, including:  Daily care of your teeth and gums.  Regular physical activity.  Eating a healthy diet.  Avoiding tobacco and drug use.  Limiting alcohol use.  Practicing safe sex.  Taking low doses of aspirin every day.  Taking vitamin and mineral supplements as recommended by your health care provider. What happens during an annual well check? The services and screenings done by your health care provider during your annual well check will depend on your age, overall health, lifestyle risk factors, and family history of disease. Counseling  Your health care provider may  ask you questions about your:  Alcohol use.  Tobacco use.  Drug use.  Emotional well-being.  Home and relationship well-being.  Sexual activity.  Eating habits.  History of falls.  Memory and ability to understand (cognition).  Work and work Statistician. Screening  You may have the following tests or measurements:  Height, weight, and BMI.  Blood pressure.  Lipid and cholesterol levels. These may be checked every 5 years, or more frequently if you are over 39 years old.  Skin check.  Lung cancer screening. You may have this screening every year starting at age 1 if you have a 30-pack-year history of smoking and currently smoke or have quit within the past 15 years.  Fecal occult blood test (FOBT) of the stool. You may have this test every year starting at age 59.  Flexible sigmoidoscopy or colonoscopy. You may have a sigmoidoscopy every 5 years or a colonoscopy every 10 years starting at age 15.  Prostate cancer screening. Recommendations will vary depending on your family history and other risks.  Hepatitis C blood test.  Hepatitis B blood test.  Sexually transmitted disease (STD) testing.  Diabetes screening. This is done by checking your blood sugar (glucose) after you have not eaten for a while (fasting). You may have this done every 1-3 years.  Abdominal aortic aneurysm (AAA) screening. You may need this if you are a current or former smoker.  Osteoporosis. You may be screened starting at age 109 if you are at high risk. Talk with your health care provider about your test results, treatment options, and if necessary, the need for more tests. Vaccines  Your health care provider may recommend  certain vaccines, such as:  Influenza vaccine. This is recommended every year.  Tetanus, diphtheria, and acellular pertussis (Tdap, Td) vaccine. You may need a Td booster every 10 years.  Zoster vaccine. You may need this after age 9.  Pneumococcal 13-valent  conjugate (PCV13) vaccine. One dose is recommended after age 81.  Pneumococcal polysaccharide (PPSV23) vaccine. One dose is recommended after age 45. Talk to your health care provider about which screenings and vaccines you need and how often you need them. This information is not intended to replace advice given to you by your health care provider. Make sure you discuss any questions you have with your health care provider. Document Released: 03/22/2015 Document Revised: 11/13/2015 Document Reviewed: 12/25/2014 Elsevier Interactive Patient Education  2017 [place] Prevention in the Home Falls can cause injuries. They can happen to people of all ages. There are many things you can do to make your home safe and to help prevent falls. What can I do on the outside of my home?  Regularly fix the edges of walkways and driveways and fix any cracks.  Remove anything that might make you trip as you walk through a door, such as a raised step or threshold.  Trim any bushes or trees on the path to your home.  Use bright outdoor lighting.  Clear any walking paths of anything that might make someone trip, such as rocks or tools.  Regularly check to see if handrails are loose or broken. Make sure that both sides of any steps have handrails.  Any raised decks and porches should have guardrails on the edges.  Have any leaves, snow, or ice cleared regularly.  Use sand or salt on walking paths during winter.  Clean up any spills in your garage right away. This includes oil or grease spills. What can I do in the bathroom?  Use night lights.  Install grab bars by the toilet and in the tub and shower. Do not use towel bars as grab bars.  Use non-skid mats or decals in the tub or shower.  If you need to sit down in the shower, use a plastic, non-slip stool.  Keep the floor dry. Clean up any water that spills on the floor as soon as it happens.  Remove soap buildup in the tub or  shower regularly.  Attach bath mats securely with double-sided non-slip rug tape.  Do not have throw rugs and other things on the floor that can make you trip. What can I do in the bedroom?  Use night lights.  Make sure that you have a light by your bed that is easy to reach.  Do not use any sheets or blankets that are too big for your bed. They should not hang down onto the floor.  Have a firm chair that has side arms. You can use this for support while you get dressed.  Do not have throw rugs and other things on the floor that can make you trip. What can I do in the kitchen?  Clean up any spills right away.  Avoid walking on wet floors.  Keep items that you use a lot in easy-to-reach places.  If you need to reach something above you, use a strong step stool that has a grab bar.  Keep electrical cords out of the way.  Do not use floor polish or wax that makes floors slippery. If you must use wax, use non-skid floor wax.  Do not have throw rugs and other  things on the floor that can make you trip. What can I do with my stairs?  Do not leave any items on the stairs.  Make sure that there are handrails on both sides of the stairs and use them. Fix handrails that are broken or loose. Make sure that handrails are as long as the stairways.  Check any carpeting to make sure that it is firmly attached to the stairs. Fix any carpet that is loose or worn.  Avoid having throw rugs at the top or bottom of the stairs. If you do have throw rugs, attach them to the floor with carpet tape.  Make sure that you have a light switch at the top of the stairs and the bottom of the stairs. If you do not have them, ask someone to add them for you. What else can I do to help prevent falls?  Wear shoes that:  Do not have high heels.  Have rubber bottoms.  Are comfortable and fit you well.  Are closed at the toe. Do not wear sandals.  If you use a stepladder:  Make sure that it is fully  opened. Do not climb a closed stepladder.  Make sure that both sides of the stepladder are locked into place.  Ask someone to hold it for you, if possible.  Clearly mark and make sure that you can see:  Any grab bars or handrails.  First and last steps.  Where the edge of each step is.  Use tools that help you move around (mobility aids) if they are needed. These include:  Canes.  Walkers.  Scooters.  Crutches.  Turn on the lights when you go into a dark area. Replace any light bulbs as soon as they burn out.  Set up your furniture so you have a clear path. Avoid moving your furniture around.  If any of your floors are uneven, fix them.  If there are any pets around you, be aware of where they are.  Review your medicines with your doctor. Some medicines can make you feel dizzy. This can increase your chance of falling. Ask your doctor what other things that you can do to help prevent falls. This information is not intended to replace advice given to you by your health care provider. Make sure you discuss any questions you have with your health care provider. Document Released: 12/20/2008 Document Revised: 08/01/2015 Document Reviewed: 03/30/2014 Elsevier Interactive Patient Education  2017 [employer].

## 2018-09-05 ENCOUNTER — Encounter: Payer: Self-pay | Admitting: Family Medicine

## 2018-09-05 ENCOUNTER — Ambulatory Visit (INDEPENDENT_AMBULATORY_CARE_PROVIDER_SITE_OTHER): Payer: Medicare Other | Admitting: Family Medicine

## 2018-09-05 ENCOUNTER — Other Ambulatory Visit: Payer: Self-pay

## 2018-09-05 VITALS — BP 132/70 | HR 84 | Temp 97.9°F | Resp 20 | Ht 68.0 in | Wt 206.0 lb

## 2018-09-05 DIAGNOSIS — G629 Polyneuropathy, unspecified: Secondary | ICD-10-CM

## 2018-09-05 DIAGNOSIS — M47817 Spondylosis without myelopathy or radiculopathy, lumbosacral region: Secondary | ICD-10-CM | POA: Diagnosis not present

## 2018-09-05 DIAGNOSIS — M810 Age-related osteoporosis without current pathological fracture: Secondary | ICD-10-CM

## 2018-09-05 DIAGNOSIS — C61 Malignant neoplasm of prostate: Secondary | ICD-10-CM

## 2018-09-05 DIAGNOSIS — E1149 Type 2 diabetes mellitus with other diabetic neurological complication: Secondary | ICD-10-CM

## 2018-09-05 DIAGNOSIS — E119 Type 2 diabetes mellitus without complications: Secondary | ICD-10-CM

## 2018-09-05 LAB — POCT GLYCOSYLATED HEMOGLOBIN (HGB A1C)
Est. average glucose Bld gHb Est-mCnc: 148
Hemoglobin A1C: 6.8 % — AB (ref 4.0–5.6)

## 2018-09-05 MED ORDER — GABAPENTIN 100 MG PO CAPS: 100.0000 mg | ORAL_CAPSULE | Freq: Two times a day (BID) | ORAL | 11 refills | Status: DC | PRN

## 2018-09-05 NOTE — Progress Notes (Signed)
Patient: Michael Murphy Male    DOB: March 01, 1928   85 y.o.   MRN: [id number] Visit Date: 09/05/2018  Today's Provider: [name], MD   Chief Complaint  Patient presents with   Diabetes   Peripheral Neuropathy   Subjective:   HPI  Diabetes Mellitus Type II, Follow-up:   Lab Results  Component Value Date   HGBA1C 6.8 (A) 09/05/2018   HGBA1C 6.3 (A) 02/08/2018   HGBA1C 6.8 (A) 09/01/2017    Last seen for diabetes 6 months ago.  Management since then includes no changes. He reports good compliance with treatment. He is not having side effects.  Current symptoms include none and have been stable. Home blood sugar records: fasting range: 130s  Episodes of hypoglycemia? no   Most Recent Eye Exam: up to date Weight trend: stable Prior visit with dietician: No Current exercise: yard work Current diet habits: well balanced  Pertinent Labs:    Component Value Date/Time   CHOL 183 02/09/2018 0815   TRIG 51 02/09/2018 0815   HDL 78 02/09/2018 0815   LDLCALC 95 02/09/2018 0815   CREATININE 0.85 03/10/2018 1307    Wt Readings from Last 3 Encounters:  09/05/18 206 lb (93.4 kg)  05/02/18 200 lb (90.7 kg)  04/29/18 200 lb (90.7 kg)     Neuropathy: Patient reports that he is still having neuropathy in both hands and feet. He reports that this has been ongoing since the last time he was seen (6 months ago). He reports that his hands and feet and becoming more painful. He describes discomfort in fingertips and toes--started about the same time.No other neurologic symptoms.  Allergies  Allergen Reactions   Accupril  [Quinapril Hcl] Swelling    throat swells   Quinapril Swelling     Current Outpatient Medications:    albuterol (PROVENTIL HFA;VENTOLIN HFA) 108 (90 Base) MCG/ACT inhaler, Inhale 2 puffs into the lungs every 6 (six) hours as needed for wheezing or shortness of breath., Disp: 1 Inhaler, Rfl: 12   amLODipine (NORVASC) 10 MG tablet, Take 1  tablet by mouth daily., Disp: , Rfl:    aspirin 81 MG chewable tablet, Chew by mouth daily., Disp: , Rfl:    calcium carbonate (CALCIUM 600) 600 MG TABS tablet, Take 600 mg by mouth daily., Disp: , Rfl:    FLAXSEED, LINSEED, PO, Take 2 capsules by mouth daily., Disp: , Rfl:    fluticasone (FLONASE) 50 MCG/ACT nasal spray, Place 1 spray into both nostrils as needed. , Disp: , Rfl:    Garlic Oil 0100 MG CAPS, Take 1 capsule by mouth daily., Disp: , Rfl:    hydroxychloroquine (PLAQUENIL) 200 MG tablet, Take 400 mg by mouth daily. , Disp: , Rfl:    losartan (COZAAR) 50 MG tablet, Take 50 mg by mouth daily., Disp: , Rfl:    LUBRICATING PLUS EYE DROPS 0.5 % SOLN, as needed. , Disp: , Rfl:    metFORMIN (GLUCOPHAGE) 500 MG tablet, Take 1 tablet by mouth daily., Disp: , Rfl:    Multiple Vitamins-Minerals (MENS MULTIVITAMIN PLUS PO), Take by mouth daily., Disp: , Rfl:    Omega-3 Fatty Acids (FISH OIL) 1000 MG CAPS, Take 1 capsule by mouth daily., Disp: , Rfl:    simvastatin (ZOCOR) 20 MG tablet, Take 1 tablet by mouth at bedtime., Disp: , Rfl:    SYMBICORT 160-4.5 MCG/ACT inhaler, Inhale 2 puffs into the lungs 2 (two) times daily. , Disp: , Rfl:  terazosin (HYTRIN) 10 MG capsule, Take 10 mg by mouth at bedtime. , Disp: , Rfl:    tiotropium (SPIRIVA HANDIHALER) 18 MCG inhalation capsule, Place 18 mcg into inhaler and inhale every morning., Disp: , Rfl:    traMADol (ULTRAM) 50 MG tablet, Take 1 tablet (50 mg total) by mouth every 6 (six) hours as needed., Disp: 20 tablet, Rfl: 0   alendronate (FOSAMAX) 70 MG tablet, TAKE 1 TABLET ONCE A WEEK WITH A FULL GLASS OF WATER ON AN EMPTY STOMACH (Patient not taking: Reported on 08/30/2018), Disp: 12 tablet, Rfl: 4   celecoxib (CELEBREX) 100 MG capsule, Take 1 capsule (100 mg total) by mouth 2 (two) times daily. (Patient not taking: Reported on 08/30/2018), Disp: 30 capsule, Rfl: 1   doxycycline (VIBRA-TABS) 100 MG tablet, Take 1 tablet (100 mg  total) by mouth 2 (two) times daily. (Patient not taking: Reported on 08/30/2018), Disp: 14 tablet, Rfl: 1   predniSONE (STERAPRED UNI-PAK 21 TAB) 10 MG (21) TBPK tablet, 6 day taper. Take 6 tablets today then 5,4,3,2,1 (Patient not taking: Reported on 08/30/2018), Disp: 21 tablet, Rfl: 1  Review of Systems  Constitutional: Negative.   HENT: Negative.   Eyes: Negative.   Respiratory: Negative for cough and shortness of breath.   Cardiovascular: Negative for chest pain, palpitations and leg swelling.  Gastrointestinal: Negative.   Endocrine: Negative.   Musculoskeletal: Negative for arthralgias, back pain and joint swelling.  Skin: Negative.   Allergic/Immunologic: Negative.   Neurological: Positive for numbness. Negative for dizziness and light-headedness.  Psychiatric/Behavioral: Negative.   All other systems reviewed and are negative.   Social History   Tobacco Use   Smoking status: Former Smoker    Packs/day: 0.50    Years: 18.00    Pack years: 9.00    Types: Cigarettes    Quit date: 03/09/1991    Years since quitting: 27.5   Smokeless tobacco: Never Used  Substance Use Topics   Alcohol use: Yes    Alcohol/week: 1.0 - 3.0 standard drinks    Types: 1 - 3 Cans of beer per week      Objective:   BP 132/70    Pulse 84    Temp 97.9 F (36.6 C)    Resp 20    Ht 5\' 8"  (1.727 m)    Wt 206 lb (93.4 kg)    SpO2 97%    BMI 31.32 kg/m  Vitals:   09/05/18 1131  BP: 132/70  Pulse: 84  Resp: 20  Temp: 97.9 F (36.6 C)  SpO2: 97%  Weight: 206 lb (93.4 kg)  Height: 5\' 8"  (1.727 m)     Physical Exam Vitals signs reviewed.  Constitutional:      Appearance: He is well-developed.  HENT:     Head: Normocephalic and atraumatic.     Right Ear: External ear normal.     Left Ear: External ear normal.     Nose: Nose normal.  Eyes:     General: No scleral icterus.    Conjunctiva/sclera: Conjunctivae normal.  Neck:     Thyroid: No thyromegaly.  Cardiovascular:     Rate  and Rhythm: Normal rate and regular rhythm.     Heart sounds: Normal heart sounds.  Pulmonary:     Effort: Pulmonary effort is normal.     Breath sounds: Normal breath sounds.  Abdominal:     Palpations: Abdomen is soft.  Skin:    General: Skin is warm and dry.  Neurological:  Mental Status: He is alert and oriented to person, place, and time.     Sensory: No sensory deficit.     Motor: No weakness.     Coordination: Coordination normal.     Comments: Tinel's sign negative bilaterally  Psychiatric:        Behavior: Behavior normal.        Thought Content: Thought content normal.        Judgment: Judgment normal.      Results for orders placed or performed in visit on 09/05/18  POCT glycosylated hemoglobin (Hb A1C)  Result Value Ref Range   Hemoglobin A1C 6.8 (A) 4.0 - 5.6 %   HbA1c POC (<> result, manual entry)     HbA1c, POC (prediabetic range)     HbA1c, POC (controlled diabetic range)     Est. average glucose Bld gHb Est-mCnc 148        Assessment & Plan    1. Type 2 diabetes mellitus without complication, without long-term current use of insulin (HCC) Good control--goal of less than 8.Preferrably less than 7.5.RTC 3 months. - POCT glycosylated hemoglobin (Hb A1C)--6.8  2. Neuropathy Likley diabetic--B12 normal. Treat with OTC B12 and alpha lipoic acid. May need neuro referral. - gabapentin (NEURONTIN) 100 MG capsule; Take 1 capsule (100 mg total) by mouth 2 (two) times daily as needed.  Dispense: 60 capsule; Refill: 11  3. CA of prostate ([place])   4. Other diabetic neurological complication associated with type 2 diabetes mellitus ([place])   5. Spondylosis of lumbosacral region without myelopathy or radiculopathy This could be etiology of neuropathy--will follow in 85 yo.  6. Age-related osteoporosis without current pathological fracture      [name], MD  [facility]

## 2018-09-05 NOTE — Patient Instructions (Signed)
Start Gabapentin 100mg  once daily for 1 week, then increase to twice daily.   Try over the counter Alpha Lipoic acid and Vitamin B12 take once daily.

## 2018-11-15 ENCOUNTER — Other Ambulatory Visit: Payer: Self-pay

## 2018-11-15 ENCOUNTER — Encounter: Payer: Self-pay | Admitting: Family Medicine

## 2018-11-15 ENCOUNTER — Ambulatory Visit (INDEPENDENT_AMBULATORY_CARE_PROVIDER_SITE_OTHER): Payer: Medicare Other | Admitting: Family Medicine

## 2018-11-15 VITALS — BP 125/70 | HR 90 | Temp 97.3°F | Resp 16 | Wt 204.0 lb

## 2018-11-15 DIAGNOSIS — Z23 Encounter for immunization: Secondary | ICD-10-CM

## 2018-11-15 DIAGNOSIS — G629 Polyneuropathy, unspecified: Secondary | ICD-10-CM

## 2018-11-15 DIAGNOSIS — E1142 Type 2 diabetes mellitus with diabetic polyneuropathy: Secondary | ICD-10-CM

## 2018-11-15 MED ORDER — GABAPENTIN 100 MG PO CAPS: 200.0000 mg | ORAL_CAPSULE | Freq: Two times a day (BID) | ORAL | 11 refills | Status: DC

## 2018-11-15 NOTE — Progress Notes (Signed)
Patient: Michael Murphy Male    DOB: 10/02/1927   85 y.o.   MRN: [id number] Visit Date: 11/15/2018  Today's Provider: [name], MD   Chief Complaint  Patient presents with  . Follow-up    neuropathy    Subjective:     HPI  Follow up for neuropathy  The patient was last seen for this 2 months ago. Changes made at last visit include starting gabapentin 100 mg BID and OTC Alpha Lipoic acid and Vitamin B12 daily .  He reports excellent compliance with treatment. Patient reports he took one month of gabapentin only. He feels that condition is Improved. He is not having side effects.   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  Allergies  Allergen Reactions  . Accupril  [Quinapril Hcl] Swelling    throat swells  . Quinapril Swelling     Current Outpatient Medications:  .  albuterol (PROVENTIL HFA;VENTOLIN HFA) 108 (90 Base) MCG/ACT inhaler, Inhale 2 puffs into the lungs every 6 (six) hours as needed for wheezing or shortness of breath., Disp: 1 Inhaler, Rfl: 12 .  amLODipine (NORVASC) 10 MG tablet, Take 1 tablet by mouth daily., Disp: , Rfl:  .  aspirin 81 MG chewable tablet, Chew by mouth daily., Disp: , Rfl:  .  calcium carbonate (CALCIUM 600) 600 MG TABS tablet, Take 600 mg by mouth daily., Disp: , Rfl:  .  FLAXSEED, LINSEED, PO, Take 2 capsules by mouth daily., Disp: , Rfl:  .  fluticasone (FLONASE) 50 MCG/ACT nasal spray, Place 1 spray into both nostrils as needed. , Disp: , Rfl:  .  hydroxychloroquine (PLAQUENIL) 200 MG tablet, Take 400 mg by mouth daily. , Disp: , Rfl:  .  losartan (COZAAR) 50 MG tablet, Take 50 mg by mouth daily., Disp: , Rfl:  .  LUBRICATING PLUS EYE DROPS 0.5 % SOLN, as needed. , Disp: , Rfl:  .  metFORMIN (GLUCOPHAGE) 500 MG tablet, Take 1 tablet by mouth daily., Disp: , Rfl:  .   Multiple Vitamins-Minerals (MENS MULTIVITAMIN PLUS PO), Take by mouth daily., Disp: , Rfl:  .  Omega-3 Fatty Acids (FISH OIL) 1000 MG CAPS, Take 1 capsule by mouth daily., Disp: , Rfl:  .  simvastatin (ZOCOR) 20 MG tablet, Take 1 tablet by mouth at bedtime., Disp: , Rfl:  .  SYMBICORT 160-4.5 MCG/ACT inhaler, Inhale 2 puffs into the lungs 2 (two) times daily. , Disp: , Rfl:  .  tiotropium (SPIRIVA HANDIHALER) 18 MCG inhalation capsule, Place 18 mcg into inhaler and inhale every morning., Disp: , Rfl:  .  gabapentin (NEURONTIN) 100 MG capsule, Take 1 capsule (100 mg total) by mouth 2 (two) times daily as needed. (Patient not taking: Reported on 11/15/2018), Disp: 60 capsule, Rfl: 11 .  Garlic Oil 123XX123 MG CAPS, Take 1 capsule by mouth daily., Disp: , Rfl:  .  terazosin (HYTRIN) 10 MG capsule, Take 10 mg by mouth at bedtime. , Disp: , Rfl:  .  traMADol (ULTRAM) 50 MG tablet, Take 1 tablet (50 mg total) by mouth every 6 (six) hours as needed. (Patient not taking: Reported on 11/15/2018), Disp: 20 tablet, Rfl: 0  Review of Systems  Constitutional: Negative.   HENT: Negative.   Eyes: Negative.   Respiratory: Negative.   Cardiovascular: Negative.   Gastrointestinal: Negative.   Endocrine: Negative.   Musculoskeletal: Positive for arthralgias.  Allergic/Immunologic: Negative.  Neurological: Positive for numbness.       He has had mild numbness in distal toes  Hematological: Negative.   Psychiatric/Behavioral: Negative.     Social History   Tobacco Use  . Smoking status: Former Smoker    Packs/day: 0.50    Years: 18.00    Pack years: 9.00    Types: Cigarettes    Quit date: 03/09/1991    Years since quitting: 27.7  . Smokeless tobacco: Never Used  Substance Use Topics  . Alcohol use: Yes    Alcohol/week: 1.0 - 3.0 standard drinks    Types: 1 - 3 Cans of beer per week      Objective:   BP 125/70 (BP Location: Right Arm, Patient Position: Sitting, Cuff Size: Normal)   Pulse 90   Temp  (!) 97.3 F (36.3 C) (Temporal)   Resp 16   Wt 204 lb (92.5 kg)   BMI 31.02 kg/m  Vitals:   11/15/18 1102  BP: 125/70  Pulse: 90  Resp: 16  Temp: (!) 97.3 F (36.3 C)  TempSrc: Temporal  Weight: 204 lb (92.5 kg)  Body mass index is 31.02 kg/m.   Physical Exam Vitals signs reviewed.  Constitutional:      Appearance: He is well-developed.  HENT:     Head: Normocephalic and atraumatic.     Right Ear: External ear normal.     Left Ear: External ear normal.     Nose: Nose normal.  Eyes:     General: No scleral icterus.    Conjunctiva/sclera: Conjunctivae normal.  Neck:     Thyroid: No thyromegaly.  Cardiovascular:     Rate and Rhythm: Normal rate and regular rhythm.     Heart sounds: Normal heart sounds.  Pulmonary:     Effort: Pulmonary effort is normal.     Breath sounds: Normal breath sounds.  Abdominal:     Palpations: Abdomen is soft.  Skin:    General: Skin is warm and dry.  Neurological:     General: No focal deficit present.     Mental Status: He is alert and oriented to person, place, and time.     Sensory: No sensory deficit.     Motor: No weakness.     Coordination: Coordination normal.     Comments: Tinel's sign negative bilaterally  Psychiatric:        Mood and Affect: Mood normal.        Behavior: Behavior normal.        Thought Content: Thought content normal.        Judgment: Judgment normal.      No results found for any visits on 11/15/18.     Assessment & Plan    1. Need for influenza vaccination  - Flu Vaccine QUAD High Dose(Fluad)  2. Neuropathy Increase dose of gabapentin. - gabapentin (NEURONTIN) 100 MG capsule; Take 2 capsules (200 mg total) by mouth 2 (two) times daily.  Dispense: 60 capsule; Refill: 11  3. Type 2 diabetes mellitus with diabetic polyneuropathy, without long-term current use of insulin ([place])      [name], MD  [facility]

## 2018-11-16 DIAGNOSIS — B351 Tinea unguium: Secondary | ICD-10-CM | POA: Diagnosis not present

## 2018-11-16 DIAGNOSIS — E119 Type 2 diabetes mellitus without complications: Secondary | ICD-10-CM | POA: Diagnosis not present

## 2019-01-10 ENCOUNTER — Ambulatory Visit: Payer: Medicare Other | Admitting: Urology

## 2019-01-11 ENCOUNTER — Ambulatory Visit (INDEPENDENT_AMBULATORY_CARE_PROVIDER_SITE_OTHER): Payer: Medicare Other | Admitting: Urology

## 2019-01-11 ENCOUNTER — Encounter: Payer: Self-pay | Admitting: Urology

## 2019-01-11 ENCOUNTER — Other Ambulatory Visit: Payer: Self-pay

## 2019-01-11 ENCOUNTER — Ambulatory Visit (INDEPENDENT_AMBULATORY_CARE_PROVIDER_SITE_OTHER): Payer: Medicare Other | Admitting: Family Medicine

## 2019-01-11 ENCOUNTER — Encounter: Payer: Self-pay | Admitting: Family Medicine

## 2019-01-11 VITALS — BP 132/68 | HR 88 | Temp 98.1°F | Resp 20 | Wt 205.0 lb

## 2019-01-11 VITALS — BP 161/70 | HR 96 | Ht 68.0 in | Wt 206.6 lb

## 2019-01-11 DIAGNOSIS — B303 Acute epidemic hemorrhagic conjunctivitis (enteroviral): Secondary | ICD-10-CM | POA: Diagnosis not present

## 2019-01-11 DIAGNOSIS — N430 Encysted hydrocele: Secondary | ICD-10-CM | POA: Diagnosis not present

## 2019-01-11 DIAGNOSIS — N4340 Spermatocele of epididymis, unspecified: Secondary | ICD-10-CM | POA: Diagnosis not present

## 2019-01-11 DIAGNOSIS — N5231 Erectile dysfunction following radical prostatectomy: Secondary | ICD-10-CM | POA: Diagnosis not present

## 2019-01-11 MED ORDER — TADALAFIL 10 MG PO TABS: ORAL_TABLET | ORAL | 2 refills | Status: DC

## 2019-01-11 NOTE — Progress Notes (Signed)
01/11/2019 10:11 AM   Michael Murphy February 27, 1928 [id number]  Referring provider: Jerrol Banana., MD [address] [place],  [place] [address]  Chief Complaint  Patient presents with  . Follow-up    HPI: 85 y.o. male presents for follow-up of a 4 cm right spermatocele and large right hydrocele.  Initially seen February 2020 with acute right scrotal pain necessitating an ED visit.  After discussing that his ultrasonic findings typically do not cause acute scrotal pain he elected observation and on a telehealth follow-up April 2020 his pain had completely resolved and he desired to continue observation.  Since that visit he denies significant scrotal pain.  He has occasional mild discomfort.  He also complains of difficulty achieving and maintaining an erection.  He states I have given him an Rx for a PDE 5 inhibitor in the past but he never had the prescription filled and has requested another Rx.  He is active and able to climb 2 flights of stairs without shortness of breath.  Denies the use of oral or sublingual nitrates.   PMH: Past Medical History:  Diagnosis Date  . Adiposity 07/12/2014  . Allergic rhinitis 02/11/2007  . Arthritis, degenerative 02/11/2007  . Benign essential HTN 02/11/2007   Goal BP< 130/80 due DMII.   Marland Kitchen CA of prostate ([place]) 07/12/2014  . Carpal tunnel syndrome 07/12/2014  . Diabetes mellitus without complication ([place])   . Diabetic neuropathy ([place]) 02/12/2009   s/p Lifestyle Education Diabetic Classes 2012.  Monofilament intact 09/18/2013-normal bilaterally.   . ED (erectile dysfunction) of organic origin 01/23/2013  . Epididymitis 07/12/2014  . Excessive urination at night 01/23/2013  . Hypercholesterolemia without hypertriglyceridemia 02/11/2007  . Hypertension   . Neurosis, posttraumatic 07/12/2014   ([place] [facility]) and ([place]).   . OP (osteoporosis) 02/11/2007    Surgical History: Past Surgical History:  Procedure Laterality Date  . CATARACT  EXTRACTION    . KNEE ARTHROSCOPY     left  . MULTIPLE TOOTH EXTRACTIONS    . PROSTATECTOMY    . ROTATOR CUFF REPAIR      Home Medications:  Allergies as of 01/11/2019      Reactions   Accupril  [quinapril Hcl] Swelling   throat swells   Quinapril Swelling      Medication List       Accurate as of January 11, 2019 10:11 AM. If you have any questions, ask your nurse or doctor.        acetaminophen-codeine 300-30 MG tablet Commonly known as: TYLENOL #3   albuterol 108 (90 Base) MCG/ACT inhaler Commonly known as: VENTOLIN HFA Inhale 2 puffs into the lungs every 6 (six) hours as needed for wheezing or shortness of breath.   alendronate 70 MG tablet Commonly known as: FOSAMAX Take by mouth.   amLODipine 10 MG tablet Commonly known as: NORVASC Take 1 tablet by mouth daily.   aspirin 81 MG chewable tablet Chew by mouth daily.   Calcium 600 600 MG Tabs tablet Generic drug: calcium carbonate Take 600 mg by mouth daily.   Fish Oil 1000 MG Caps Take 1 capsule by mouth daily.   FLAXSEED (LINSEED) PO Take 2 capsules by mouth daily.   fluticasone 50 MCG/ACT nasal spray Commonly known as: FLONASE Place 1 spray into both nostrils as needed.   gabapentin 100 MG capsule Commonly known as: NEURONTIN Take 2 capsules (200 mg total) by mouth 2 (two) times daily.   Garlic Oil 123XX123 MG Caps Take  1 capsule by mouth daily.   hydroxychloroquine 200 MG tablet Commonly known as: PLAQUENIL Take 400 mg by mouth daily.   ibuprofen 400 MG tablet Commonly known as: ADVIL   losartan 50 MG tablet Commonly known as: COZAAR Take 50 mg by mouth daily.   Lubricating Plus Eye Drops 0.5 % Soln Generic drug: Carboxymethylcellulose Sod PF as needed.   MENS MULTIVITAMIN PLUS PO Take by mouth daily.   metFORMIN 500 MG tablet Commonly known as: GLUCOPHAGE Take 1 tablet by mouth daily.   Periogard 0.12 % solution Generic drug: chlorhexidine   simvastatin 20 MG tablet Commonly  known as: ZOCOR Take 1 tablet by mouth at bedtime.   Spiriva HandiHaler 18 MCG inhalation capsule Generic drug: tiotropium Place 18 mcg into inhaler and inhale every morning.   Symbicort 160-4.5 MCG/ACT inhaler Generic drug: budesonide-formoterol Inhale 2 puffs into the lungs 2 (two) times daily.   terazosin 10 MG capsule Commonly known as: HYTRIN Take 10 mg by mouth at bedtime.   traMADol 50 MG tablet Commonly known as: Ultram Take 1 tablet (50 mg total) by mouth every 6 (six) hours as needed.       Allergies:  Allergies  Allergen Reactions  . Accupril  [Quinapril Hcl] Swelling    throat swells  . Quinapril Swelling    Family History: Family History  Problem Relation Age of Onset  . Hypertension Mother   . Stroke Mother   . Diabetes Sister   . Hypertension Sister   . Diabetes Brother   . Diabetes Brother     Social History:  reports that he quit smoking about 27 years ago. His smoking use included cigarettes. He has a 9.00 pack-year smoking history. He has never used smokeless tobacco. He reports current alcohol use of about 1.0 - 3.0 standard drinks of alcohol per week. He reports that he does not use drugs.  ROS: UROLOGY Frequent Urination?: No Hard to postpone urination?: No Burning/pain with urination?: No Get up at night to urinate?: No Leakage of urine?: No Urine stream starts and stops?: No Trouble starting stream?: No Do you have to strain to urinate?: No Blood in urine?: No Urinary tract infection?: No Sexually transmitted disease?: No Injury to kidneys or bladder?: No Painful intercourse?: No Weak stream?: No Erection problems?: No Penile pain?: No  Gastrointestinal Nausea?: No Vomiting?: No Indigestion/heartburn?: No Diarrhea?: No Constipation?: No  Constitutional Fever: No Night sweats?: No Weight loss?: No Fatigue?: No  Skin Skin rash/lesions?: No Itching?: No  Eyes Blurred vision?: Yes Double vision?: No   Ears/Nose/Throat Sore throat?: No Sinus problems?: No  Hematologic/Lymphatic Swollen glands?: No Easy bruising?: No  Cardiovascular Leg swelling?: No Chest pain?: No  Respiratory Cough?: No Shortness of breath?: No  Endocrine Excessive thirst?: No  Musculoskeletal Back pain?: No Joint pain?: No  Neurological Headaches?: No Dizziness?: No  Psychologic Depression?: No Anxiety?: No  Physical Exam: BP (!) 161/70 (BP Location: Left Arm, Patient Position: Sitting, Cuff Size: Normal)   Pulse 96   Ht 5\' 8"  (1.727 m)   Wt 206 lb 9.6 oz (93.7 kg)   BMI 31.41 kg/m   Constitutional:  Alert and oriented, No acute distress. HEENT: [place] AT, moist mucus membranes.  Trachea midline, no masses. Cardiovascular: No clubbing, cyanosis, or edema. Respiratory: Normal respiratory effort, no increased work of breathing. GI: Abdomen is soft, nontender, nondistended, no abdominal masses GU: Phallus without lesions, testes descended bilaterally.  Bilateral hydroceles along with right spermatocele. Lymph: No cervical or inguinal  lymphadenopathy. Skin: No rashes, bruises or suspicious lesions. Neurologic: Grossly intact, no focal deficits, moving all 4 extremities. Psychiatric: Normal mood and affect.   Assessment & Plan:   85 y.o. male with bilateral hydrocele and right spermatocele minimally symptomatic.  He desires to continue observation.  He has requested PDE 5 inhibitor trial for ED.  Rx tadalafil 10 mg was sent to pharmacy.  Annual follow-up.  Abbie Sons, [place] [address], [place],  [id number] [phone]

## 2019-01-11 NOTE — Progress Notes (Signed)
Patient: Michael Murphy Male    DOB: 12-18-1927   85 y.o.   MRN: [id number] Visit Date: 01/11/2019  Today's Provider: [name], MD   Chief Complaint  Patient presents with  . eye redness   Subjective:   HPI Patient comes in today c/o right eye redness X 1 week. He denies any injury. He denies any changes in his vision. He has not had any changes in medications.  No known trauma.  No other symptoms at all.  He has been using warm compresses for a few days with no improvement in redness.  BP Readings from Last 3 Encounters:  01/11/19 132/68  01/11/19 (!) 161/70  11/15/18 125/70    Allergies  Allergen Reactions  . Accupril  [Quinapril Hcl] Swelling    throat swells  . Quinapril Swelling     Current Outpatient Medications:  .  acetaminophen-codeine (TYLENOL #3) 300-30 MG tablet, , Disp: , Rfl:  .  albuterol (PROVENTIL HFA;VENTOLIN HFA) 108 (90 Base) MCG/ACT inhaler, Inhale 2 puffs into the lungs every 6 (six) hours as needed for wheezing or shortness of breath., Disp: 1 Inhaler, Rfl: 12 .  alendronate (FOSAMAX) 70 MG tablet, Take by mouth., Disp: , Rfl:  .  amLODipine (NORVASC) 10 MG tablet, Take 1 tablet by mouth daily., Disp: , Rfl:  .  aspirin 81 MG chewable tablet, Chew by mouth daily., Disp: , Rfl:  .  calcium carbonate (CALCIUM 600) 600 MG TABS tablet, Take 600 mg by mouth daily., Disp: , Rfl:  .  FLAXSEED, LINSEED, PO, Take 2 capsules by mouth daily., Disp: , Rfl:  .  fluticasone (FLONASE) 50 MCG/ACT nasal spray, Place 1 spray into both nostrils as needed. , Disp: , Rfl:  .  gabapentin (NEURONTIN) 100 MG capsule, Take 2 capsules (200 mg total) by mouth 2 (two) times daily., Disp: 60 capsule, Rfl: 11 .  Garlic Oil 123XX123 MG CAPS, Take 1 capsule by mouth daily., Disp: , Rfl:  .  hydroxychloroquine (PLAQUENIL) 200 MG tablet, Take 400 mg by mouth daily. , Disp: , Rfl:  .  ibuprofen (ADVIL) 400 MG tablet, , Disp: , Rfl:  .  losartan (COZAAR) 50 MG tablet, Take 50  mg by mouth daily., Disp: , Rfl:  .  LUBRICATING PLUS EYE DROPS 0.5 % SOLN, as needed. , Disp: , Rfl:  .  metFORMIN (GLUCOPHAGE) 500 MG tablet, Take 1 tablet by mouth daily., Disp: , Rfl:  .  Multiple Vitamins-Minerals (MENS MULTIVITAMIN PLUS PO), Take by mouth daily., Disp: , Rfl:  .  Omega-3 Fatty Acids (FISH OIL) 1000 MG CAPS, Take 1 capsule by mouth daily., Disp: , Rfl:  .  PERIOGARD 0.12 % solution, , Disp: , Rfl:  .  simvastatin (ZOCOR) 20 MG tablet, Take 1 tablet by mouth at bedtime., Disp: , Rfl:  .  SYMBICORT 160-4.5 MCG/ACT inhaler, Inhale 2 puffs into the lungs 2 (two) times daily. , Disp: , Rfl:  .  tadalafil (CIALIS) 10 MG tablet, 1 tab 30-60 minutes prior to intercourse, Disp: 10 tablet, Rfl: 2 .  terazosin (HYTRIN) 10 MG capsule, Take 10 mg by mouth at bedtime. , Disp: , Rfl:  .  tiotropium (SPIRIVA HANDIHALER) 18 MCG inhalation capsule, Place 18 mcg into inhaler and inhale every morning., Disp: , Rfl:  .  traMADol (ULTRAM) 50 MG tablet, Take 1 tablet (50 mg total) by mouth every 6 (six) hours as needed., Disp: 20 tablet, Rfl: 0  Review  of Systems  Constitutional: Negative for activity change, fatigue and fever.  Eyes: Positive for redness. Negative for photophobia, pain, discharge and itching.  Respiratory: Negative.   Cardiovascular: Negative.   Neurological: Negative.   Psychiatric/Behavioral: Negative.     Social History   Tobacco Use  . Smoking status: Former Smoker    Packs/day: 0.50    Years: 18.00    Pack years: 9.00    Types: Cigarettes    Quit date: 03/09/1991    Years since quitting: 27.8  . Smokeless tobacco: Never Used  Substance Use Topics  . Alcohol use: Yes    Alcohol/week: 1.0 - 3.0 standard drinks    Types: 1 - 3 Cans of beer per week      Objective:   BP 132/68   Pulse 88   Temp 98.1 F (36.7 C)   Resp 20   Wt 205 lb (93 kg)   SpO2 98%   BMI 31.17 kg/m  Vitals:   01/11/19 1044  BP: 132/68  Pulse: 88  Resp: 20  Temp: 98.1 F  (36.7 C)  SpO2: 98%  Weight: 205 lb (93 kg)  Body mass index is 31.17 kg/m.   Physical Exam Vitals signs reviewed.  Constitutional:      Appearance: Normal appearance.  HENT:     Head: Normocephalic and atraumatic.     Right Ear: External ear normal.     Left Ear: External ear normal.  Eyes:     General: No scleral icterus.       Right eye: No discharge.        Left eye: No discharge.     Extraocular Movements: Extraocular movements intact.     Pupils: Pupils are equal, round, and reactive to light.     Comments: He has a right conjunctival hemorrhage in the inner lower quadrant of the right eye.  No visual disturbance.  Visual fields intact.  Extraocular motion intact.  Neurological:     Mental Status: He is alert.      No results found for any visits on 01/11/19.     Assessment & Plan    1. Conjunctivitis, acute hemorrhagic Apply hot compress to right eye several times a daily.  Refer to ophthalmology if this gets worse.  I think this will be a self limiting issue.     Waldron Gerry Cranford Mon, MD  [facility]

## 2019-01-11 NOTE — Patient Instructions (Signed)
1.Conjunctivitis, acute hemorrhagic Apply hot compress to right eye several times a daily.

## 2019-01-16 ENCOUNTER — Ambulatory Visit: Payer: Self-pay | Admitting: Family Medicine

## 2019-01-16 NOTE — Progress Notes (Signed)
Patient: Michael Murphy Male    DOB: [birth year]   85 y.o.   MRN: [id number] Visit Date: 01/17/2019  Today's Provider: [name], MD   Chief Complaint  Patient presents with  . Follow-up  . Diabetes   Subjective:     HPI   Type 2 diabetes mellitus without complication, without long-term current use of insulin ([place]) From 09/05/2018-Good control. Hb A1C 6.8. It has been running 130 fasting  Neuropathy Needs foot exam From 11/15/2018-Increased dose of gabapentin 100 MG capsule; Take 2 capsules (200 mg total) by mouth 2 (two) times daily.    Patient does complain of tingling of both hands right greater than left. conjunctival hemorrhage has resolved.   Allergies  Allergen Reactions  . Accupril  [Quinapril Hcl] Swelling    throat swells  . Quinapril Swelling     Current Outpatient Medications:  .  albuterol (PROVENTIL HFA;VENTOLIN HFA) 108 (90 Base) MCG/ACT inhaler, Inhale 2 puffs into the lungs every 6 (six) hours as needed for wheezing or shortness of breath., Disp: 1 Inhaler, Rfl: 12 .  amLODipine (NORVASC) 10 MG tablet, Take 1 tablet by mouth daily., Disp: , Rfl:  .  aspirin 81 MG chewable tablet, Chew by mouth daily., Disp: , Rfl:  .  calcium carbonate (CALCIUM 600) 600 MG TABS tablet, Take 600 mg by mouth daily., Disp: , Rfl:  .  FLAXSEED, LINSEED, PO, Take 2 capsules by mouth daily., Disp: , Rfl:  .  fluticasone (FLONASE) 50 MCG/ACT nasal spray, Place 1 spray into both nostrils as needed. , Disp: , Rfl:  .  gabapentin (NEURONTIN) 100 MG capsule, Take 2 capsules (200 mg total) by mouth 2 (two) times daily., Disp: 60 capsule, Rfl: 11 .  Garlic Oil 123XX123 MG CAPS, Take 1 capsule by mouth daily., Disp: , Rfl:  .  hydroxychloroquine (PLAQUENIL) 200 MG tablet, Take 400 mg by mouth daily. , Disp: , Rfl:  .  losartan (COZAAR) 50 MG tablet, Take 50 mg by mouth daily., Disp: , Rfl:  .  metFORMIN (GLUCOPHAGE) 500 MG tablet, Take 1 tablet by mouth daily., Disp: ,  Rfl:  .  Multiple Vitamins-Minerals (MENS MULTIVITAMIN PLUS PO), Take by mouth daily., Disp: , Rfl:  .  Omega-3 Fatty Acids (FISH OIL) 1000 MG CAPS, Take 1 capsule by mouth daily., Disp: , Rfl:  .  simvastatin (ZOCOR) 20 MG tablet, Take 1 tablet by mouth at bedtime., Disp: , Rfl:  .  SYMBICORT 160-4.5 MCG/ACT inhaler, Inhale 2 puffs into the lungs 2 (two) times daily. , Disp: , Rfl:  .  tadalafil (CIALIS) 10 MG tablet, 1 tab 30-60 minutes prior to intercourse, Disp: 10 tablet, Rfl: 2 .  terazosin (HYTRIN) 10 MG capsule, Take 10 mg by mouth at bedtime. , Disp: , Rfl:  .  tiotropium (SPIRIVA HANDIHALER) 18 MCG inhalation capsule, Place 18 mcg into inhaler and inhale every morning., Disp: , Rfl:  .  traMADol (ULTRAM) 50 MG tablet, Take 1 tablet (50 mg total) by mouth every 6 (six) hours as needed., Disp: 20 tablet, Rfl: 0 .  acetaminophen-codeine (TYLENOL #3) 300-30 MG tablet, , Disp: , Rfl:  .  alendronate (FOSAMAX) 70 MG tablet, Take by mouth., Disp: , Rfl:  .  ibuprofen (ADVIL) 400 MG tablet, , Disp: , Rfl:  .  LUBRICATING PLUS EYE DROPS 0.5 % SOLN, as needed. , Disp: , Rfl:  .  PERIOGARD 0.12 % solution, , Disp: , Rfl:  Review of Systems  Constitutional: Negative for appetite change, chills and fever.  HENT: Negative.   Eyes: Negative.   Respiratory: Negative for chest tightness, shortness of breath and wheezing.   Cardiovascular: Negative for chest pain and palpitations.  Gastrointestinal: Negative for abdominal pain, nausea and vomiting.  Endocrine: Negative.   Musculoskeletal: Positive for arthralgias.  Allergic/Immunologic: Negative.   Neurological:       Tingling in both hands.  Hematological: Negative.   Psychiatric/Behavioral: Negative.     Social History   Tobacco Use  . Smoking status: Former Smoker    Packs/day: 0.50    Years: 18.00    Pack years: 9.00    Types: Cigarettes    Quit date: 03/09/1991    Years since quitting: 27.8  . Smokeless tobacco: Never Used   Substance Use Topics  . Alcohol use: Yes    Alcohol/week: 1.0 - 3.0 standard drinks    Types: 1 - 3 Cans of beer per week      Objective:   BP (!) 143/75   Pulse 77   Temp (!) 97.5 F (36.4 C) (Temporal)   Resp 18   Ht 5\' 8"  (1.727 m)   Wt 202 lb (91.6 kg)   BMI 30.71 kg/m  Vitals:   01/17/19 1050  BP: (!) 143/75  Pulse: 77  Resp: 18  Temp: (!) 97.5 F (36.4 C)  TempSrc: Temporal  Weight: 202 lb (91.6 kg)  Height: 5\' 8"  (1.727 m)  Body mass index is 30.71 kg/m.   Physical Exam Vitals signs reviewed.  Constitutional:      Appearance: He is well-developed.  HENT:     Head: Normocephalic and atraumatic.     Right Ear: External ear normal.     Left Ear: External ear normal.     Nose: Nose normal.  Eyes:     General: No scleral icterus.    Conjunctiva/sclera: Conjunctivae normal.  Neck:     Thyroid: No thyromegaly.  Cardiovascular:     Rate and Rhythm: Normal rate and regular rhythm.     Heart sounds: Normal heart sounds.  Pulmonary:     Effort: Pulmonary effort is normal.     Breath sounds: Normal breath sounds.  Abdominal:     Palpations: Abdomen is soft.  Skin:    General: Skin is warm and dry.  Neurological:     Mental Status: He is alert and oriented to person, place, and time.     Sensory: No sensory deficit.     Motor: No weakness.     Coordination: Coordination normal.     Comments: Tinel's sign positive bilaterally Monofilament foot exam is normal.  Psychiatric:        Behavior: Behavior normal.        Thought Content: Thought content normal.        Judgment: Judgment normal.      Results for orders placed or performed in visit on 01/17/19  POCT HgB A1C  Result Value Ref Range   Hemoglobin A1C 6.8 (A) 4.0 - 5.6 %   Est. average glucose Bld gHb Est-mCnc 148        Assessment & Plan    1. Type 2 diabetes mellitus with diabetic polyneuropathy, without long-term current use of insulin (HCC) Good control with A1c of 6.8.  Continue  Metformin. - POCT HgB A1C - Microalbumin / creatinine urine ratio  2. Bilateral carpal tunnel syndrome Try cock-up wrist splints.  May need orthopedic referral if this does not  improve.  Obtain B12 level.  Rule out simple neuropathy I do think this is carpal tunnel syndrome.  3. Benign essential HTN On amlodipine and losartan  4. Hypercholesterolemia without hypertriglyceridemia On Zocor.  5. Spondylosis of lumbosacral region without myelopathy or radiculopathy   6.h/o  CA of prostate (HCC)  7.  conjunctival hemorrhage Resolved.     Cranford Mon, MD  [facility]

## 2019-01-17 ENCOUNTER — Ambulatory Visit (INDEPENDENT_AMBULATORY_CARE_PROVIDER_SITE_OTHER): Payer: Medicare Other | Admitting: Family Medicine

## 2019-01-17 ENCOUNTER — Other Ambulatory Visit: Payer: Self-pay

## 2019-01-17 VITALS — BP 143/75 | HR 77 | Temp 97.5°F | Resp 18 | Ht 68.0 in | Wt 202.0 lb

## 2019-01-17 DIAGNOSIS — E7800 Pure hypercholesterolemia, unspecified: Secondary | ICD-10-CM | POA: Diagnosis not present

## 2019-01-17 DIAGNOSIS — M47817 Spondylosis without myelopathy or radiculopathy, lumbosacral region: Secondary | ICD-10-CM

## 2019-01-17 DIAGNOSIS — I10 Essential (primary) hypertension: Secondary | ICD-10-CM | POA: Diagnosis not present

## 2019-01-17 DIAGNOSIS — G5603 Carpal tunnel syndrome, bilateral upper limbs: Secondary | ICD-10-CM

## 2019-01-17 DIAGNOSIS — C61 Malignant neoplasm of prostate: Secondary | ICD-10-CM

## 2019-01-17 DIAGNOSIS — E1142 Type 2 diabetes mellitus with diabetic polyneuropathy: Secondary | ICD-10-CM | POA: Diagnosis not present

## 2019-01-17 LAB — POCT GLYCOSYLATED HEMOGLOBIN (HGB A1C)
Est. average glucose Bld gHb Est-mCnc: 148
Hemoglobin A1C: 6.8 % — AB (ref 4.0–5.6)

## 2019-01-17 NOTE — Patient Instructions (Signed)
Please ask for a Cock-Up Wrist splint at the pharmacy and start using them for the Carpal Tunnel.

## 2019-02-15 DIAGNOSIS — E119 Type 2 diabetes mellitus without complications: Secondary | ICD-10-CM | POA: Diagnosis not present

## 2019-02-15 DIAGNOSIS — B351 Tinea unguium: Secondary | ICD-10-CM | POA: Diagnosis not present

## 2019-03-15 DIAGNOSIS — Z20828 Contact with and (suspected) exposure to other viral communicable diseases: Secondary | ICD-10-CM | POA: Diagnosis not present

## 2019-05-17 DIAGNOSIS — E119 Type 2 diabetes mellitus without complications: Secondary | ICD-10-CM | POA: Diagnosis not present

## 2019-05-17 DIAGNOSIS — B351 Tinea unguium: Secondary | ICD-10-CM | POA: Diagnosis not present

## 2019-07-13 NOTE — Progress Notes (Signed)
Established patient visit  I,[name],acting as a scribe for [name], MD.,have documented all relevant documentation on the behalf of [name], MD,as directed by  [name], MD while in the presence of [name], MD.   Patient: Michael Murphy   DOB: February 08, 1928   [name]  MRN: [id number] Visit Date: 07/17/2019  Today's healthcare provider: [name], MD   No chief complaint on file.  Subjective    HPI  Overall patient is doing well.  Only issue has today is a skin change on his right lower leg. He has no complaints today.  He does not check his blood pressure at home. Diabetes Mellitus Type II, follow-up  Lab Results  Component Value Date   HGBA1C 6.8 (A) 01/17/2019   HGBA1C 6.8 (A) 09/05/2018   HGBA1C 6.3 (A) 02/08/2018   Last seen for diabetes 6 months ago.  Management since then includes continuing the same treatment. He reports good compliance with treatment. He is not having side effects. none  Home blood sugar records: fasting range: 129  Episodes of hypoglycemia? No none   Current insulin regiment: n/a Most Recent Eye Exam: Due  --------------------------------------------------------------------  Hypertension, follow-up  BP Readings from Last 3 Encounters:  01/17/19 (!) 143/75  01/11/19 132/68  01/11/19 (!) 161/70   He was last seen for hypertension 6 months ago.  BP at that visit was 143/75. Management since that visit includes; continue medications. He reports good compliance with treatment. He is not having side effects. none He is not exercising. He is adherent to low salt diet.   Outside blood pressures are not checking regularly.  He does not smoke.  Use of agents associated with hypertension: none.   --------------------------------------------------------------------  Lipid/Cholesterol, follow-up  Last Lipid Panel: Lab Results  Component Value Date   CHOL 183 02/09/2018    LDLCALC 95 02/09/2018   HDL 78 02/09/2018   TRIG 51 02/09/2018   ALT 17 02/09/2018   AST 20 02/09/2018   PLT 187 03/10/2018    He was last seen for this 6 months ago.  Management since that visit includes; no changes, on Zocor. He reports good compliance with treatment. He is not having side effects. none He is following a Regular diet. Current exercise: none  Wt Readings from Last 3 Encounters:  01/17/19 202 lb (91.6 kg)  01/11/19 205 lb (93 kg)  01/11/19 206 lb 9.6 oz (93.7 kg)   Last metabolic panel Lab Results  Component Value Date   GLUCOSE 148 (H) 03/10/2018   NA 138 03/10/2018   K 3.6 03/10/2018   BUN 11 03/10/2018   CREATININE 0.85 03/10/2018   GFRNONAA >60 03/10/2018   GFRAA >60 03/10/2018   CALCIUM 9.0 03/10/2018   AST 20 02/09/2018   ALT 17 02/09/2018   The ASCVD Risk score (Goff DC Jr., et al., 2013) failed to calculate for the following reasons:   The 2013 ASCVD risk score is only valid for ages 17 to 23  --------------------------------------------------------------------        Medications: Outpatient Medications Prior to Visit  Medication Sig  . acetaminophen-codeine (TYLENOL #3) 300-30 MG tablet   . albuterol (PROVENTIL HFA;VENTOLIN HFA) 108 (90 Base) MCG/ACT inhaler Inhale 2 puffs into the lungs every 6 (six) hours as needed for wheezing or shortness of breath.  Marland Kitchen alendronate (FOSAMAX) 70 MG tablet Take by mouth.  Marland Kitchen amLODipine (NORVASC) 10 MG tablet Take 1 tablet by mouth daily.  Marland Kitchen  aspirin 81 MG chewable tablet Chew by mouth daily.  . calcium carbonate (CALCIUM 600) 600 MG TABS tablet Take 600 mg by mouth daily.  Marland Kitchen FLAXSEED, LINSEED, PO Take 2 capsules by mouth daily.  . fluticasone (FLONASE) 50 MCG/ACT nasal spray Place 1 spray into both nostrils as needed.   . gabapentin (NEURONTIN) 100 MG capsule Take 2 capsules (200 mg total) by mouth 2 (two) times daily.  . Garlic Oil 123XX123 MG CAPS Take 1 capsule by mouth daily.  . hydroxychloroquine  (PLAQUENIL) 200 MG tablet Take 400 mg by mouth daily.   Marland Kitchen ibuprofen (ADVIL) 400 MG tablet   . losartan (COZAAR) 50 MG tablet Take 50 mg by mouth daily.  . LUBRICATING PLUS EYE DROPS 0.5 % SOLN as needed.   . metFORMIN (GLUCOPHAGE) 500 MG tablet Take 1 tablet by mouth daily.  . Multiple Vitamins-Minerals (MENS MULTIVITAMIN PLUS PO) Take by mouth daily.  . Omega-3 Fatty Acids (FISH OIL) 1000 MG CAPS Take 1 capsule by mouth daily.  Marland Kitchen PERIOGARD 0.12 % solution   . simvastatin (ZOCOR) 20 MG tablet Take 1 tablet by mouth at bedtime.  . SYMBICORT 160-4.5 MCG/ACT inhaler Inhale 2 puffs into the lungs 2 (two) times daily.   . tadalafil (CIALIS) 10 MG tablet 1 tab 30-60 minutes prior to intercourse  . terazosin (HYTRIN) 10 MG capsule Take 10 mg by mouth at bedtime.   Marland Kitchen tiotropium (SPIRIVA HANDIHALER) 18 MCG inhalation capsule Place 18 mcg into inhaler and inhale every morning.   No facility-administered medications prior to visit.    Review of Systems  Constitutional: Negative for appetite change, chills and fever.  Eyes: Negative.   Respiratory: Negative for chest tightness, shortness of breath and wheezing.   Cardiovascular: Negative for chest pain and palpitations.  Gastrointestinal: Negative for abdominal pain, nausea and vomiting.  Endocrine: Negative.   Musculoskeletal: Positive for arthralgias.  Allergic/Immunologic: Negative.   Neurological: Negative.   Hematological: Negative.   Psychiatric/Behavioral: Negative.        Objective    There were no vitals taken for this visit. BP Readings from Last 3 Encounters:  07/17/19 (!) 154/72  01/17/19 (!) 143/75  01/11/19 132/68   Wt Readings from Last 3 Encounters:  07/17/19 209 lb (94.8 kg)  01/17/19 202 lb (91.6 kg)  01/11/19 205 lb (93 kg)      Physical Exam Vitals reviewed.  Constitutional:      Appearance: He is well-developed.     Comments: Patient appears younger than his age of 63  HENT:     Head: Normocephalic and  atraumatic.     Right Ear: External ear normal.     Left Ear: External ear normal.     Nose: Nose normal.  Eyes:     General: No scleral icterus.    Conjunctiva/sclera: Conjunctivae normal.  Neck:     Thyroid: No thyromegaly.  Cardiovascular:     Rate and Rhythm: Normal rate and regular rhythm.     Heart sounds: Normal heart sounds.  Pulmonary:     Effort: Pulmonary effort is normal.     Breath sounds: Normal breath sounds.  Abdominal:     Palpations: Abdomen is soft.  Musculoskeletal:     Right lower leg: Edema present.     Left lower leg: Edema present.     Comments: 1+ bilateral lower extremity edema  Skin:    General: Skin is warm and dry.     Comments: He has a silver dollar sized  dermatitis on the right lower leg.  No infection and no skin breakdown  Neurological:     General: No focal deficit present.     Mental Status: He is alert and oriented to person, place, and time.     Sensory: No sensory deficit.     Motor: No weakness.     Coordination: Coordination normal.     Comments: Tinel's sign positive bilaterally Monofilament foot exam is normal.  Psychiatric:        Mood and Affect: Mood normal.        Behavior: Behavior normal.        Thought Content: Thought content normal.        Judgment: Judgment normal.       No results found for any visits on 07/17/19.  Assessment & Plan     1. Type 2 diabetes mellitus with diabetic polyneuropathy, without long-term current use of insulin ([place]) Obtain labs.  Patient has been stable for years with diabetes. - Hemoglobin A1C - CBC w/Diff/Platelet - Lipid panel - Comprehensive Metabolic Panel (CMET) - TSH  2. Benign essential HTN Patient to check blood pressure at home and follow-up in 3 months with these readings.  Bring in cuff also. - Hemoglobin A1C - CBC w/Diff/Platelet - Lipid panel - Comprehensive Metabolic Panel (CMET) - TSH  3. Hypercholesterolemia without hypertriglyceridemia On Zocor 20 - Hemoglobin  A1C - CBC w/Diff/Platelet - Lipid panel - Comprehensive Metabolic Panel (CMET) - TSH  4. Dermatitis Treat with topical triamcinolone.  I think this will resolve easily.  He is asymptomatic with it. - triamcinolone cream (KENALOG) 0.1 %; Apply 1 application topically 2 (two) times daily.  Dispense: 30 g; Refill: 0   No follow-ups on file.         Darrie Macmillan Cranford Mon, MD  [facility] [phone] (phone) [phone] (fax)  [place]

## 2019-07-17 ENCOUNTER — Other Ambulatory Visit: Payer: Self-pay

## 2019-07-17 ENCOUNTER — Ambulatory Visit (INDEPENDENT_AMBULATORY_CARE_PROVIDER_SITE_OTHER): Payer: Medicare Other | Admitting: Family Medicine

## 2019-07-17 ENCOUNTER — Encounter: Payer: Self-pay | Admitting: Family Medicine

## 2019-07-17 VITALS — BP 154/72 | HR 85 | Temp 97.7°F | Resp 18 | Ht 68.0 in | Wt 209.0 lb

## 2019-07-17 DIAGNOSIS — E1142 Type 2 diabetes mellitus with diabetic polyneuropathy: Secondary | ICD-10-CM

## 2019-07-17 DIAGNOSIS — L309 Dermatitis, unspecified: Secondary | ICD-10-CM

## 2019-07-17 DIAGNOSIS — I10 Essential (primary) hypertension: Secondary | ICD-10-CM

## 2019-07-17 DIAGNOSIS — E7800 Pure hypercholesterolemia, unspecified: Secondary | ICD-10-CM | POA: Diagnosis not present

## 2019-07-17 MED ORDER — TRIAMCINOLONE ACETONIDE 0.1 % EX CREA: 1.0000 "application " | TOPICAL_CREAM | Freq: Two times a day (BID) | CUTANEOUS | 0 refills | Status: DC

## 2019-07-18 DIAGNOSIS — E7800 Pure hypercholesterolemia, unspecified: Secondary | ICD-10-CM | POA: Diagnosis not present

## 2019-07-18 DIAGNOSIS — E1142 Type 2 diabetes mellitus with diabetic polyneuropathy: Secondary | ICD-10-CM | POA: Diagnosis not present

## 2019-07-18 DIAGNOSIS — I10 Essential (primary) hypertension: Secondary | ICD-10-CM | POA: Diagnosis not present

## 2019-07-19 LAB — COMPREHENSIVE METABOLIC PANEL
ALT: 15 IU/L (ref 0–44)
AST: 21 IU/L (ref 0–40)
Albumin/Globulin Ratio: 1.7 (ref 1.2–2.2)
Albumin: 4.5 g/dL (ref 3.6–4.6)
Alkaline Phosphatase: 65 IU/L (ref 39–117)
BUN/Creatinine Ratio: 9 — ABNORMAL LOW (ref 10–24)
BUN: 9 mg/dL (ref 8–27)
Bilirubin Total: 0.8 mg/dL (ref 0.0–1.2)
CO2: 23 mmol/L (ref 20–29)
Calcium: 9.6 mg/dL (ref 8.6–10.2)
Chloride: 105 mmol/L (ref 96–106)
Creatinine, Ser: 0.96 mg/dL (ref 0.76–1.27)
GFR calc Af Amer: 81 mL/min/{1.73_m2} (ref >59)
GFR calc non Af Amer: 70 mL/min/{1.73_m2} (ref >59)
Globulin, Total: 2.7 g/dL (ref 1.5–4.5)
Glucose: 113 mg/dL — ABNORMAL HIGH (ref 65–99)
Potassium: 4.3 mmol/L (ref 3.5–5.2)
Sodium: 142 mmol/L (ref 134–144)
Total Protein: 7.2 g/dL (ref 6.0–8.5)

## 2019-07-19 LAB — CBC WITH DIFFERENTIAL/PLATELET
Basophils Absolute: 0 10*3/uL (ref 0.0–0.2)
Basos: 1 %
EOS (ABSOLUTE): 0.5 10*3/uL — ABNORMAL HIGH (ref 0.0–0.4)
Eos: 6 %
Hematocrit: 41.5 % (ref 37.5–51.0)
Hemoglobin: 14 g/dL (ref 13.0–17.7)
Immature Grans (Abs): 0 10*3/uL (ref 0.0–0.1)
Immature Granulocytes: 0 %
Lymphocytes Absolute: 2.1 10*3/uL (ref 0.7–3.1)
Lymphs: 26 %
MCH: 28.7 pg (ref 26.6–33.0)
MCHC: 33.7 g/dL (ref 31.5–35.7)
MCV: 85 fL (ref 79–97)
Monocytes Absolute: 0.6 10*3/uL (ref 0.1–0.9)
Monocytes: 8 %
Neutrophils Absolute: 4.9 10*3/uL (ref 1.4–7.0)
Neutrophils: 59 %
Platelets: 176 10*3/uL (ref 150–450)
RBC: 4.87 x10E6/uL (ref 4.14–5.80)
RDW: 13.9 % (ref 11.6–15.4)
WBC: 8.2 10*3/uL (ref 3.4–10.8)

## 2019-07-19 LAB — LIPID PANEL
Chol/HDL Ratio: 2.3 ratio (ref 0.0–5.0)
Cholesterol, Total: 166 mg/dL (ref 100–199)
HDL: 73 mg/dL (ref >39)
LDL Chol Calc (NIH): 83 mg/dL (ref 0–99)
Triglycerides: 45 mg/dL (ref 0–149)
VLDL Cholesterol Cal: 10 mg/dL (ref 5–40)

## 2019-07-19 LAB — HEMOGLOBIN A1C
Est. average glucose Bld gHb Est-mCnc: 146 mg/dL
Hgb A1c MFr Bld: 6.7 % — ABNORMAL HIGH (ref 4.8–5.6)

## 2019-07-19 LAB — TSH: TSH: 1.43 u[IU]/mL (ref 0.450–4.500)

## 2019-07-24 ENCOUNTER — Telehealth: Payer: Self-pay

## 2019-07-24 NOTE — Telephone Encounter (Signed)
-----   Message from Jerrol Banana., MD sent at 07/23/2019 10:28 AM EDT ----- Labs stable

## 2019-07-24 NOTE — Telephone Encounter (Signed)
Patient advised.

## 2019-08-17 DIAGNOSIS — E119 Type 2 diabetes mellitus without complications: Secondary | ICD-10-CM | POA: Diagnosis not present

## 2019-08-17 DIAGNOSIS — B351 Tinea unguium: Secondary | ICD-10-CM | POA: Diagnosis not present

## 2019-08-30 NOTE — Progress Notes (Signed)
Subjective:   Michael Murphy is a 86 y.o. male who presents for Medicare Annual/Subsequent preventive examination.  I connected with Michael Murphy today by telephone and verified that I am speaking with the correct person using two identifiers. Location patient: home Location provider: work Persons participating in the virtual visit: patient, provider.   I discussed the limitations, risks, security and privacy concerns of performing an evaluation and management service by telephone and the availability of in person appointments. I also discussed with the patient that there may be a patient responsible charge related to this service. The patient expressed understanding and verbally consented to this telephonic visit.    Interactive audio and video telecommunications were attempted between this provider and patient, however failed, due to patient having technical difficulties OR patient did not have access to video capability.  We continued and completed visit with audio only.    Review of Systems    N/A  Cardiac Risk Factors include: advanced age (>76men, >25 women);diabetes mellitus;male gender;hypertension;dyslipidemia     Objective:    Today's Vitals   08/31/19 1118  PainSc: 6    There is no height or weight on file to calculate BMI.  Advanced Directives 08/31/2019 08/30/2018 04/29/2018 03/10/2018 08/27/2017 03/09/2017 08/04/2016  Does Patient Have a Medical Advance Directive? No;Yes Yes No No Yes No No  Type of Paramedic of [place];Living will Living will - - Living will - -  Does patient want to make changes to medical advance directive? Yes (MAU/Ambulatory/Procedural Areas - Information given) - - - - - -  Would patient like information on creating a medical advance directive? Yes (MAU/Ambulatory/Procedural Areas - Information given) - - - - No - Patient declined No - Patient declined    Current Medications (verified) Outpatient Encounter Medications as of  08/31/2019  Medication Sig  . albuterol (PROVENTIL HFA;VENTOLIN HFA) 108 (90 Base) MCG/ACT inhaler Inhale 2 puffs into the lungs every 6 (six) hours as needed for wheezing or shortness of breath.  Marland Kitchen alendronate (FOSAMAX) 70 MG tablet Take by mouth.  Marland Kitchen amLODipine (NORVASC) 10 MG tablet Take 1 tablet by mouth daily.  Marland Kitchen aspirin 81 MG chewable tablet Chew by mouth daily.  . calcium carbonate (CALCIUM 600) 600 MG TABS tablet Take 600 mg by mouth daily.  Marland Kitchen FLAXSEED, LINSEED, PO Take 2 capsules by mouth daily.  . fluticasone (FLONASE) 50 MCG/ACT nasal spray Place 1 spray into both nostrils as needed.   . gabapentin (NEURONTIN) 100 MG capsule Take 2 capsules (200 mg total) by mouth 2 (two) times daily.  . Garlic Oil 0254 MG CAPS Take 1 capsule by mouth daily.  . hydroxychloroquine (PLAQUENIL) 200 MG tablet Take 400 mg by mouth daily.   Marland Kitchen losartan (COZAAR) 50 MG tablet Take 50 mg by mouth daily.  . metFORMIN (GLUCOPHAGE) 500 MG tablet Take 1 tablet by mouth daily.  . Multiple Vitamins-Minerals (MENS MULTIVITAMIN PLUS PO) Take by mouth daily.  . Omega-3 Fatty Acids (FISH OIL) 1000 MG CAPS Take 1 capsule by mouth daily.  Marland Kitchen PERIOGARD 0.12 % solution   . simvastatin (ZOCOR) 20 MG tablet Take 1 tablet by mouth at bedtime.  . SYMBICORT 160-4.5 MCG/ACT inhaler Inhale 2 puffs into the lungs 2 (two) times daily.   Marland Kitchen tiotropium (SPIRIVA HANDIHALER) 18 MCG inhalation capsule Place 18 mcg into inhaler and inhale every morning.  . triamcinolone cream (KENALOG) 0.1 % Apply 1 application topically 2 (two) times daily.  . tadalafil (CIALIS) 10 MG  tablet 1 tab 30-60 minutes prior to intercourse (Patient not taking: Reported on 08/31/2019)  . [DISCONTINUED] acetaminophen-codeine (TYLENOL #3) 300-30 MG tablet   . [DISCONTINUED] ibuprofen (ADVIL) 400 MG tablet   . [DISCONTINUED] LUBRICATING PLUS EYE DROPS 0.5 % SOLN as needed.   . [DISCONTINUED] terazosin (HYTRIN) 10 MG capsule Take 10 mg by mouth at bedtime.    No  facility-administered encounter medications on file as of 08/31/2019.    Allergies (verified) Accupril  [quinapril hcl] and Quinapril   History: Past Medical History:  Diagnosis Date  . Adiposity 07/12/2014  . Allergic rhinitis 02/11/2007  . Arthritis, degenerative 02/11/2007  . Benign essential HTN 02/11/2007   Goal BP< 130/80 due DMII.   Marland Kitchen CA of prostate ([place]) 07/12/2014  . Carpal tunnel syndrome 07/12/2014  . Diabetes mellitus without complication ([place])   . Diabetic neuropathy ([place]) 02/12/2009   s/p Lifestyle Education Diabetic Classes 2012.  Monofilament intact 09/18/2013-normal bilaterally.   . ED (erectile dysfunction) of organic origin 01/23/2013  . Epididymitis 07/12/2014  . Excessive urination at night 01/23/2013  . Hypercholesterolemia without hypertriglyceridemia 02/11/2007  . Hypertension   . Neurosis, posttraumatic 07/12/2014   ([place] [facility]) and ([place]).   . OP (osteoporosis) 02/11/2007   Past Surgical History:  Procedure Laterality Date  . CATARACT EXTRACTION    . KNEE ARTHROSCOPY     left  . MULTIPLE TOOTH EXTRACTIONS    . PROSTATECTOMY    . ROTATOR CUFF REPAIR     Family History  Problem Relation Age of Onset  . Hypertension Mother   . Stroke Mother   . Diabetes Sister   . Hypertension Sister   . Diabetes Brother   . Diabetes Brother    Social History   Socioeconomic History  . Marital status: Widowed    Spouse name: Not on file  . Number of children: 8  . Years of education: Not on file  . Highest education level: GED or equivalent  Occupational History  . Occupation: retired  Tobacco Use  . Smoking status: Former Smoker    Packs/day: 0.50    Years: 18.00    Pack years: 9.00    Types: Cigarettes    Quit date: 03/09/1991    Years since quitting: 28.4  . Smokeless tobacco: Never Used  Vaping Use  . Vaping Use: Never used  Substance and Sexual Activity  . Alcohol use: Yes    Alcohol/week: 1.0 - 3.0 standard drink    Types: 1 - 3 Cans of beer per  week  . Drug use: No  . Sexual activity: Yes    Birth control/protection: None  Other Topics Concern  . Not on file  Social History Narrative  . Not on file   Social Determinants of Health   Financial Resource Strain: Low Risk   . Difficulty of Paying Living Expenses: Not hard at all  Food Insecurity: No Food Insecurity  . Worried About Charity fundraiser in the Last Year: Never true  . Ran Out of Food in the Last Year: Never true  Transportation Needs: No Transportation Needs  . Lack of Transportation (Medical): No  . Lack of Transportation (Non-Medical): No  Physical Activity:   . Days of Exercise per Week:   . Minutes of Exercise per Session:   Stress: No Stress Concern Present  . Feeling of Stress : Not at all  Social Connections: Moderately Isolated  . Frequency of Communication with Friends and Family: More than three times a week  .  Frequency of Social Gatherings with Friends and Family: Once a week  . Attends Religious Services: More than 4 times per year  . Active Member of Clubs or Organizations: No  . Attends Archivist Meetings: Never  . Marital Status: Widowed    Tobacco Counseling Counseling given: Not Answered   Clinical Intake:  Pre-visit preparation completed: Yes  Pain : 0-10 Pain Score: 6  Pain Type: Chronic pain Pain Location: Toe (Comment which one) (Fingers, and toes) Pain Orientation: Right, Left Pain Descriptors / Indicators: Tingling Pain Onset: More than a month ago Pain Relieving Factors: Running hands under hot water helps pain  Pain Relieving Factors: Running hands under hot water helps pain  Nutritional Risks: None Diabetes: Yes CBG done?: No Did pt. bring in CBG monitor from home?: No  How often do you need to have someone help you when you read instructions, pamphlets, or other written materials from your doctor or pharmacy?: 1 - Never  Diabetic? Yes  Nutrition Risk Assessment:  Has the patient had any N/V/D  within the last 2 months?  No  Does the patient have any non-healing wounds?  No  Has the patient had any unintentional weight loss or weight gain?  No   Diabetes:  Is the patient diabetic?  Yes  If diabetic, was a CBG obtained today?  No  Did the patient bring in their glucometer from home?  No  How often do you monitor your CBG's? Monitors blood sugars once a week on mondays.   Financial Strains and Diabetes Management:  Are you having any financial strains with the device, your supplies or your medication? No .  Does the patient want to be seen by Chronic Care Management for management of their diabetes?  No  Would the patient like to be referred to a Nutritionist or for Diabetic Management?  No   Diabetic Exams:  Diabetic Eye Exam: Overdue for diabetic eye exam. Pt has been advised about the importance in completing this exam. Patient advised to call and schedule an eye exam. Diabetic Foot Exam: Overdue, Pt has been advised about the importance in completing this exam..   Interpreter Needed?: No  Information entered by :: screws,LPN   Activities of Daily Living In your present state of health, do you have any difficulty performing the following activities: 08/31/2019  Hearing? N  Vision? N  Difficulty concentrating or making decisions? Y  Comment occasional forgetfullness  Walking or climbing stairs? Y  Comment Has sob with climbing stairs  Dressing or bathing? N  Doing errands, shopping? N  Preparing Food and eating ? N  Using the Toilet? N  In the past six months, have you accidently leaked urine? N  Do you have problems with loss of bowel control? N  Managing your Medications? N  Managing your Finances? N  Housekeeping or managing your Housekeeping? N  Some recent data might be hidden    Patient Care Team: Jerrol Banana., MD as PCP - General (Family Medicine) Sharlotte Alamo, DPM as Consulting Physician (Podiatry) Lendon Collar, MD as Referring  Physician (Family Medicine)  Indicate any recent Medical Services you may have received from other than [facility] providers in the past year (date may be approximate).     Assessment:   This is a routine wellness examination for Michael Murphy.  Hearing/Vision screen No exam data present  Dietary issues and exercise activities discussed: Current Exercise Habits: The patient does not participate in regular exercise at present  Goals    .  DIET - INCREASE WATER INTAKE     Recommend increasing water intake to 6 glasses a day.     . Eat more fruits and vegetables     Recommend increasing fruits and vegetables in diet to at least two servings a day.    . Exercise 3x per week (30 min per time)      Depression Screen PHQ 2/9 Scores 08/31/2019 08/30/2018 08/27/2017 08/04/2016 08/22/2014  PHQ - 2 Score 0 0 2 5 0  PHQ- 9 Score - - 6 18 -    Fall Risk Fall Risk  08/31/2019 08/30/2018 08/27/2017 08/04/2016 11/06/2015  Falls in the past year? 0 0 Yes No Yes  Comment - - - - Emmi Telephone Survey: data to providers prior to load  Number falls in past yr: 0 - 1 - 1  Comment - - - - Emmi Telephone Survey Actual Response = 1  Injury with Fall? 0 - No - Yes  Follow up Falls evaluation completed - Falls prevention discussed - -    Any stairs in or around the home? No If so, are there any without handrails? N/A Home free of loose throw rugs in walkways, pet beds, electrical cords, etc? Yes  Adequate lighting in your home to reduce risk of falls? Yes   ASSISTIVE DEVICES UTILIZED TO PREVENT FALLS:  Life alert? Yes  Use of a cane, walker or w/c? Yes uses walker and cane, has a motorized scooter Grab bars in the bathroom? No  Shower chair or bench in shower? Yes  Elevated toilet seat or a handicapped toilet? No    Cognitive Function:     6CIT Screen 08/31/2019 08/04/2016  What Year? 0 points 0 points  What month? 0 points 0 points  What time? 0 points 0 points  Count back from 20 0 points 0 points  Months in  reverse 2 points 0 points  Repeat phrase 4 points 2 points  Total Score 6 2    Immunizations Immunization History  Administered Date(s) Administered  . Fluad Quad(high Dose 65+) 11/15/2018  . Influenza Split 11/24/2008, 12/03/2009, 12/26/2010  . Influenza, High Dose Seasonal PF 12/03/2015, 12/01/2016  . Influenza-Unspecified 12/08/2014  . Pneumococcal Conjugate-13 08/22/2014  . Pneumococcal Polysaccharide-23 12/01/2016  . Tdap 12/26/2010    TDAP status: Up to date Flu Vaccine status: Up to date Pneumococcal vaccine status: Up to date Covid-19 vaccine status: Completed vaccines  Qualifies for Shingles Vaccine? Yes   Zostavax completed No   Shingrix Completed?: No.    Education has been provided regarding the importance of this vaccine. Patient has been advised to call insurance company to determine out of pocket expense if they have not yet received this vaccine. Advised may also receive vaccine at local pharmacy or Health Dept. Verbalized acceptance and understanding.  Screening Tests Health Maintenance  Topic Date Due  . FOOT EXAM  Never done  . COVID-19 Vaccine (1) Never done  . OPHTHALMOLOGY EXAM  05/17/2019  . INFLUENZA VACCINE  10/08/2019  . HEMOGLOBIN A1C  01/18/2020  . TETANUS/TDAP  12/25/2020  . PNA vac Low Risk Adult  Completed    Health Maintenance  Health Maintenance Due  Topic Date Due  . FOOT EXAM  Never done  . COVID-19 Vaccine (1) Never done  . OPHTHALMOLOGY EXAM  05/17/2019    Colorectal cancer screening: No longer required.   Lung Cancer Screening: (Low Dose CT Chest recommended if Age 42-80 years, 30 pack-year currently smoking OR have quit w/in 15years.) does  not qualify.    Additional Screening:  Vision Screening: Recommended annual ophthalmology exams for early detection of glaucoma and other disorders of the eye. Is the patient up to date with their annual eye exam?  Yes  Who is the provider or what is the name of the office in which the  patient attends annual eye exams? [facility] If pt is not established with a provider, would they like to be referred to a provider to establish care? No .   Dental Screening: Recommended annual dental exams for proper oral hygiene  Community Resource Referral / Chronic Care Management: CRR required this visit?  No   CCM required this visit?  No      Plan:     I have personally reviewed and noted the following in the patient's chart:   . Medical and social history . Use of alcohol, tobacco or illicit drugs  . Current medications and supplements . Functional ability and status . Nutritional status . Physical activity . Advanced directives . List of other physicians . Hospitalizations, surgeries, and ER visits in previous 12 months . Vitals . Screenings to include cognitive, depression, and falls . Referrals and appointments  In addition, I have reviewed and discussed with patient certain preventive protocols, quality metrics, and best practice recommendations. A written personalized care plan for preventive services as well as general preventive health recommendations were provided to patient.     Ling Flesch [place], [place]   06/02/7122   Nurse Notes: Patient will get foot exam, and eye exam scheduled through the [facility] [place] in [place]. He will have results sent to [place] via fax. Patient will also bring in his vaccine card with proof of his COVID vaccine in at next appointment.

## 2019-08-31 ENCOUNTER — Other Ambulatory Visit: Payer: Self-pay

## 2019-08-31 ENCOUNTER — Ambulatory Visit (INDEPENDENT_AMBULATORY_CARE_PROVIDER_SITE_OTHER): Payer: Medicare Other

## 2019-08-31 DIAGNOSIS — Z0000 Encounter for general adult medical examination without abnormal findings: Secondary | ICD-10-CM | POA: Diagnosis not present

## 2019-08-31 NOTE — Patient Instructions (Signed)
Michael Murphy , Thank you for taking time to come for your Medicare Wellness Visit. I appreciate your ongoing commitment to your health goals. Please review the following plan we discussed and let me know if I can assist you in the future.   Screening recommendations/referrals: Colonoscopy: Not Required Recommended yearly ophthalmology/optometry visit for glaucoma screening and checkup Recommended yearly dental visit for hygiene and checkup  Vaccinations: Influenza vaccine: Done, due 11/2019 Pneumococcal vaccine: Completed series Tdap vaccine: Up to date, due 12/2020 Shingles vaccine: currently due, declined today    Advanced directives: Advance directive discussed with you today. Even though you declined this today please call our office should you change your mind and we can give you the proper paperwork for you to fill out.   Conditions/risks identified: Increase water intake to at least 6 glasses per day, increase your vegetable, and fruit consumption to at least 2 serving daily, and increase physical activity for at least 30 minutes 3x per week.  Next appointment: 10/18/2019 @ 1040 with Dr. Rosanna Randy;  Preventive Care 65 Years and Older, Male Preventive care refers to lifestyle choices and visits with your health care provider that can promote health and wellness. What does preventive care include?  A yearly physical exam. This is also called an annual well check.  Dental exams once or twice a year.  Routine eye exams. Ask your health care provider how often you should have your eyes checked.  Personal lifestyle choices, including:  Daily care of your teeth and gums.  Regular physical activity.  Eating a healthy diet.  Avoiding tobacco and drug use.  Limiting alcohol use.  Practicing safe sex.  Taking low doses of aspirin every day.  Taking vitamin and mineral supplements as recommended by your health care provider. What happens during an annual well check? The  services and screenings done by your health care provider during your annual well check will depend on your age, overall health, lifestyle risk factors, and family history of disease. Counseling  Your health care provider may ask you questions about your:  Alcohol use.  Tobacco use.  Drug use.  Emotional well-being.  Home and relationship well-being.  Sexual activity.  Eating habits.  History of falls.  Memory and ability to understand (cognition).  Work and work Statistician. Screening  You may have the following tests or measurements:  Height, weight, and BMI.  Blood pressure.  Lipid and cholesterol levels. These may be checked every 5 years, or more frequently if you are over 43 years old.  Skin check.  Lung cancer screening. You may have this screening every year starting at age 84 if you have a 30-pack-year history of smoking and currently smoke or have quit within the past 15 years.  Fecal occult blood test (FOBT) of the stool. You may have this test every year starting at age 38.  Flexible sigmoidoscopy or colonoscopy. You may have a sigmoidoscopy every 5 years or a colonoscopy every 10 years starting at age 9.  Prostate cancer screening. Recommendations will vary depending on your family history and other risks.  Hepatitis C blood test.  Hepatitis B blood test.  Sexually transmitted disease (STD) testing.  Diabetes screening. This is done by checking your blood sugar (glucose) after you have not eaten for a while (fasting). You may have this done every 1-3 years.  Abdominal aortic aneurysm (AAA) screening. You may need this if you are a current or former smoker.  Osteoporosis. You may be screened starting at age  70 if you are at high risk. Talk with your health care provider about your test results, treatment options, and if necessary, the need for more tests. Vaccines  Your health care provider may recommend certain vaccines, such as:  Influenza  vaccine. This is recommended every year.  Tetanus, diphtheria, and acellular pertussis (Tdap, Td) vaccine. You may need a Td booster every 10 years.  Zoster vaccine. You may need this after age 10.  Pneumococcal 13-valent conjugate (PCV13) vaccine. One dose is recommended after age 69.  Pneumococcal polysaccharide (PPSV23) vaccine. One dose is recommended after age 22. Talk to your health care provider about which screenings and vaccines you need and how often you need them. This information is not intended to replace advice given to you by your health care provider. Make sure you discuss any questions you have with your health care provider. Document Released: 03/22/2015 Document Revised: 11/13/2015 Document Reviewed: 12/25/2014 Elsevier Interactive Patient Education  2017 [place] Prevention in the Home Falls can cause injuries. They can happen to people of all ages. There are many things you can do to make your home safe and to help prevent falls. What can I do on the outside of my home?  Regularly fix the edges of walkways and driveways and fix any cracks.  Remove anything that might make you trip as you walk through a door, such as a raised step or threshold.  Trim any bushes or trees on the path to your home.  Use bright outdoor lighting.  Clear any walking paths of anything that might make someone trip, such as rocks or tools.  Regularly check to see if handrails are loose or broken. Make sure that both sides of any steps have handrails.  Any raised decks and porches should have guardrails on the edges.  Have any leaves, snow, or ice cleared regularly.  Use sand or salt on walking paths during winter.  Clean up any spills in your garage right away. This includes oil or grease spills. What can I do in the bathroom?  Use night lights.  Install grab bars by the toilet and in the tub and shower. Do not use towel bars as grab bars.  Use non-skid mats or decals  in the tub or shower.  If you need to sit down in the shower, use a plastic, non-slip stool.  Keep the floor dry. Clean up any water that spills on the floor as soon as it happens.  Remove soap buildup in the tub or shower regularly.  Attach bath mats securely with double-sided non-slip rug tape.  Do not have throw rugs and other things on the floor that can make you trip. What can I do in the bedroom?  Use night lights.  Make sure that you have a light by your bed that is easy to reach.  Do not use any sheets or blankets that are too big for your bed. They should not hang down onto the floor.  Have a firm chair that has side arms. You can use this for support while you get dressed.  Do not have throw rugs and other things on the floor that can make you trip. What can I do in the kitchen?  Clean up any spills right away.  Avoid walking on wet floors.  Keep items that you use a lot in easy-to-reach places.  If you need to reach something above you, use a strong step stool that has a grab bar.  Keep  electrical cords out of the way.  Do not use floor polish or wax that makes floors slippery. If you must use wax, use non-skid floor wax.  Do not have throw rugs and other things on the floor that can make you trip. What can I do with my stairs?  Do not leave any items on the stairs.  Make sure that there are handrails on both sides of the stairs and use them. Fix handrails that are broken or loose. Make sure that handrails are as long as the stairways.  Check any carpeting to make sure that it is firmly attached to the stairs. Fix any carpet that is loose or worn.  Avoid having throw rugs at the top or bottom of the stairs. If you do have throw rugs, attach them to the floor with carpet tape.  Make sure that you have a light switch at the top of the stairs and the bottom of the stairs. If you do not have them, ask someone to add them for you. What else can I do to help  prevent falls?  Wear shoes that:  Do not have high heels.  Have rubber bottoms.  Are comfortable and fit you well.  Are closed at the toe. Do not wear sandals.  If you use a stepladder:  Make sure that it is fully opened. Do not climb a closed stepladder.  Make sure that both sides of the stepladder are locked into place.  Ask someone to hold it for you, if possible.  Clearly mark and make sure that you can see:  Any grab bars or handrails.  First and last steps.  Where the edge of each step is.  Use tools that help you move around (mobility aids) if they are needed. These include:  Canes.  Walkers.  Scooters.  Crutches.  Turn on the lights when you go into a dark area. Replace any light bulbs as soon as they burn out.  Set up your furniture so you have a clear path. Avoid moving your furniture around.  If any of your floors are uneven, fix them.  If there are any pets around you, be aware of where they are.  Review your medicines with your doctor. Some medicines can make you feel dizzy. This can increase your chance of falling. Ask your doctor what other things that you can do to help prevent falls. This information is not intended to replace advice given to you by your health care provider. Make sure you discuss any questions you have with your health care provider. Document Released: 12/20/2008 Document Revised: 08/01/2015 Document Reviewed: 03/30/2014 Elsevier Interactive Patient Education  2017 [employer].

## 2019-09-08 DIAGNOSIS — M1711 Unilateral primary osteoarthritis, right knee: Secondary | ICD-10-CM | POA: Diagnosis not present

## 2019-10-17 NOTE — Progress Notes (Signed)
I,Michael Murphy,acting as a scribe for Michael Durie, MD.,have documented all relevant documentation on the behalf of Michael Durie, MD,as directed by  Michael Durie, MD while in the presence of Michael Durie, MD.   Established patient visit   Patient: Michael Murphy   DOB: [birth year]   [name]  MRN: [id number] Visit Date: 10/18/2019  Today's healthcare provider: [name], MD   Chief Complaint  Patient presents with  . Diabetes  . Follow-up  . Hypertension   Subjective    HPI  Patient feels well and has no complaints today.  He is taking his medications. Diabetes Mellitus Type II, follow-up  Lab Results  Component Value Date   HGBA1C 6.7 (A) 10/18/2019   HGBA1C 6.7 (H) 07/18/2019   HGBA1C 6.8 (A) 01/17/2019   Last seen for diabetes 3 months ago.  Management since then includes continuing the same treatment. He reports good compliance with treatment. He is not having side effects. none  Home blood sugar records: fasting range: does check  Episodes of hypoglycemia? No none   Current insulin regiment: n /a Most Recent Eye Exam: 05/2019 at [facility]  --------------------------------------------------------------------  Hypertension, follow-up  BP Readings from Last 3 Encounters:  10/18/19 132/75  07/17/19 (!) 154/72  01/17/19 (!) 143/75   Wt Readings from Last 3 Encounters:  10/18/19 207 lb (93.9 kg)  07/17/19 209 lb (94.8 kg)  01/17/19 202 lb (91.6 kg)     He was last seen for hypertension 3 months ago.  BP at that visit was 154/72. Management since that visit includes; Patient to check blood pressure at home and follow-up in 3 months with these readings. Advised to bring in cuff also. He reports good compliance with treatment. He is not having side effects. none He is not exercising. He is adherent to low salt diet.   Outside blood pressures are 141/71 .  He does not smoke.  Use of agents associated with hypertension: none.    --------------------------------------------------------------------       Medications: Outpatient Medications Prior to Visit  Medication Sig  . albuterol (PROVENTIL HFA;VENTOLIN HFA) 108 (90 Base) MCG/ACT inhaler Inhale 2 puffs into the lungs every 6 (six) hours as needed for wheezing or shortness of breath.  Marland Kitchen amLODipine (NORVASC) 10 MG tablet Take 1 tablet by mouth daily.  Marland Kitchen aspirin 81 MG chewable tablet Chew by mouth daily.  . calcium carbonate (CALCIUM 600) 600 MG TABS tablet Take 600 mg by mouth daily.  Marland Kitchen FLAXSEED, LINSEED, PO Take 2 capsules by mouth daily.  . fluticasone (FLONASE) 50 MCG/ACT nasal spray Place 1 spray into both nostrils as needed.   . gabapentin (NEURONTIN) 100 MG capsule Take 2 capsules (200 mg total) by mouth 2 (two) times daily.  . Garlic Oil 5027 MG CAPS Take 1 capsule by mouth daily.  . hydroxychloroquine (PLAQUENIL) 200 MG tablet Take 400 mg by mouth daily.   Marland Kitchen losartan (COZAAR) 50 MG tablet Take 50 mg by mouth daily.  . metFORMIN (GLUCOPHAGE) 500 MG tablet Take 1 tablet by mouth daily.  . Multiple Vitamins-Minerals (MENS MULTIVITAMIN PLUS PO) Take by mouth daily.  . Omega-3 Fatty Acids (FISH OIL) 1000 MG CAPS Take 1 capsule by mouth daily.  Marland Kitchen PERIOGARD 0.12 % solution   . simvastatin (ZOCOR) 20 MG tablet Take 1 tablet by mouth at bedtime.  . SYMBICORT 160-4.5 MCG/ACT inhaler Inhale 2 puffs into the lungs 2 (two) times daily.   Marland Kitchen tiotropium (  SPIRIVA HANDIHALER) 18 MCG inhalation capsule Place 18 mcg into inhaler and inhale every morning.  . triamcinolone cream (KENALOG) 0.1 % Apply 1 application topically 2 (two) times daily.  Marland Kitchen alendronate (FOSAMAX) 70 MG tablet Take by mouth. (Patient not taking: Reported on 10/18/2019)  . tadalafil (CIALIS) 10 MG tablet 1 tab 30-60 minutes prior to intercourse (Patient not taking: Reported on 08/31/2019)   No facility-administered medications prior to visit.    Review of Systems  Constitutional: Negative for  appetite change, chills and fever.  Respiratory: Negative for chest tightness, shortness of breath and wheezing.   Cardiovascular: Negative for chest pain and palpitations.  Gastrointestinal: Negative for abdominal pain, nausea and vomiting.    Last lipids Lab Results  Component Value Date   CHOL 166 07/18/2019   HDL 73 07/18/2019   LDLCALC 83 07/18/2019   TRIG 45 07/18/2019   CHOLHDL 2.3 07/18/2019   Last hemoglobin A1c Lab Results  Component Value Date   HGBA1C 6.7 (A) 10/18/2019      Objective    BP 132/75 (BP Location: Left Arm, Patient Position: Sitting, Cuff Size: Large)   Pulse 84   Temp 98.2 F (36.8 C) (Oral)   Resp 18   Ht 5\' 8"  (1.727 m)   Wt 207 lb (93.9 kg)   SpO2 97%   BMI 31.47 kg/m  BP Readings from Last 3 Encounters:  10/18/19 132/75  07/17/19 (!) 154/72  01/17/19 (!) 143/75   Wt Readings from Last 3 Encounters:  10/18/19 207 lb (93.9 kg)  07/17/19 209 lb (94.8 kg)  01/17/19 202 lb (91.6 kg)      Physical Exam Vitals reviewed.  Constitutional:      Appearance: He is well-developed.     Comments: Patient appears younger than his age of 83  HENT:     Head: Normocephalic and atraumatic.     Right Ear: External ear normal.     Left Ear: External ear normal.     Nose: Nose normal.  Eyes:     General: No scleral icterus.    Conjunctiva/sclera: Conjunctivae normal.  Neck:     Thyroid: No thyromegaly.  Cardiovascular:     Rate and Rhythm: Normal rate and regular rhythm.     Heart sounds: Normal heart sounds.  Pulmonary:     Effort: Pulmonary effort is normal.     Breath sounds: Normal breath sounds.  Abdominal:     Palpations: Abdomen is soft.  Musculoskeletal:     Right lower leg: Edema present.     Left lower leg: Edema present.     Comments: 1+ bilateral lower extremity edema  Skin:    General: Skin is warm and dry.     Comments: He has a silver dollar sized dermatitis on the right lower leg.  No infection and no skin breakdown   Neurological:     General: No focal deficit present.     Mental Status: He is alert and oriented to person, place, and time.     Sensory: No sensory deficit.     Motor: No weakness.     Coordination: Coordination normal.     Comments: Tinel's sign positive bilaterally Monofilament foot exam is normal.  Psychiatric:        Mood and Affect: Mood normal.        Behavior: Behavior normal.        Thought Content: Thought content normal.        Judgment: Judgment normal.  BP 132/75 (BP Location: Left Arm, Patient Position: Sitting, Cuff Size: Large)   Pulse 84   Temp 98.2 F (36.8 C) (Oral)   Resp 18   Ht 5\' 8"  (1.727 m)   Wt 207 lb (93.9 kg)   SpO2 97%   BMI 31.47 kg/m   General Appearance:    Alert, cooperative, no distress, appears stated age  Head:    Normocephalic, without obvious abnormality, atraumatic  Eyes:    PERRL, conjunctiva/corneas clear, EOM's intact, fundi    benign, both eyes       Ears:    Normal TM's and external ear canals, both ears  Nose:   Nares normal, septum midline, mucosa normal, no drainage   or sinus tenderness  Throat:   Lips, mucosa, and tongue normal; teeth and gums normal  Neck:   Supple, symmetrical, trachea midline, no adenopathy;       thyroid:  No enlargement/tenderness/nodules; no carotid   bruit or JVD  Back:     Symmetric, no curvature, ROM normal, no CVA tenderness  Lungs:     Clear to auscultation bilaterally, respirations unlabored  Chest wall:    No tenderness or deformity  Heart:    Regular rate and rhythm, S1 and S2 normal, no murmur, rub   or gallop  Abdomen:     Soft, non-tender, bowel sounds active all four quadrants,    no masses, no organomegaly  Genitalia:    Normal male without lesion, discharge or tenderness  Rectal:    Normal tone, normal prostate, no masses or tenderness;   guaiac negative stool  Extremities:   Extremities normal, atraumatic, no cyanosis or edema  Pulses:   2+ and symmetric all extremities  Skin:    Skin color, texture, turgor normal, no rashes or lesions  Lymph nodes:   Cervical, supraclavicular, and axillary nodes normal  Neurologic:   CNII-XII intact. Normal strength, sensation and reflexes      throughout     Results for orders placed or performed in visit on 10/18/19  POCT glycosylated hemoglobin (Hb A1C)  Result Value Ref Range   Hemoglobin A1C 6.7 (A) 4.0 - 5.6 %   Est. average glucose Bld gHb Est-mCnc 146     Assessment & Plan     1. Type 2 diabetes mellitus with diabetic polyneuropathy, without long-term current use of insulin (HCC) Good control.  No changes in medications.  I will see him back in 4 months. - POCT glycosylated hemoglobin (Hb A1C) 6.7 today.  2. Benign essential HTN Good control.  Patient has had both Covid vaccines.   Return in about 4 months (around 02/17/2020).         Emile Kyllo Cranford Mon, MD  [facility] [phone] (phone) [phone] (fax)  [place]

## 2019-10-18 ENCOUNTER — Ambulatory Visit (INDEPENDENT_AMBULATORY_CARE_PROVIDER_SITE_OTHER): Payer: Medicare Other | Admitting: Family Medicine

## 2019-10-18 ENCOUNTER — Other Ambulatory Visit: Payer: Self-pay

## 2019-10-18 ENCOUNTER — Encounter: Payer: Self-pay | Admitting: Family Medicine

## 2019-10-18 VITALS — BP 132/75 | HR 84 | Temp 98.2°F | Resp 18 | Ht 68.0 in | Wt 207.0 lb

## 2019-10-18 DIAGNOSIS — I10 Essential (primary) hypertension: Secondary | ICD-10-CM

## 2019-10-18 DIAGNOSIS — E1142 Type 2 diabetes mellitus with diabetic polyneuropathy: Secondary | ICD-10-CM

## 2019-10-18 LAB — POCT GLYCOSYLATED HEMOGLOBIN (HGB A1C)
Est. average glucose Bld gHb Est-mCnc: 146
Hemoglobin A1C: 6.7 % — AB (ref 4.0–5.6)

## 2019-11-20 DIAGNOSIS — B351 Tinea unguium: Secondary | ICD-10-CM | POA: Diagnosis not present

## 2019-11-20 DIAGNOSIS — E119 Type 2 diabetes mellitus without complications: Secondary | ICD-10-CM | POA: Diagnosis not present

## 2019-11-21 ENCOUNTER — Encounter: Payer: Self-pay | Admitting: Emergency Medicine

## 2019-11-21 ENCOUNTER — Emergency Department: Payer: Medicare Other

## 2019-11-21 ENCOUNTER — Other Ambulatory Visit: Payer: Self-pay

## 2019-11-21 ENCOUNTER — Emergency Department
Admission: EM | Admit: 2019-11-21 | Discharge: 2019-11-21 | Disposition: A | Discharge status: Home / Self Care | Payer: Medicare Other | Attending: Emergency Medicine | Admitting: Emergency Medicine

## 2019-11-21 DIAGNOSIS — Z7982 Long term (current) use of aspirin: Secondary | ICD-10-CM | POA: Insufficient documentation

## 2019-11-21 DIAGNOSIS — Z79899 Other long term (current) drug therapy: Secondary | ICD-10-CM | POA: Insufficient documentation

## 2019-11-21 DIAGNOSIS — Z7984 Long term (current) use of oral hypoglycemic drugs: Secondary | ICD-10-CM | POA: Diagnosis not present

## 2019-11-21 DIAGNOSIS — Z8546 Personal history of malignant neoplasm of prostate: Secondary | ICD-10-CM | POA: Diagnosis not present

## 2019-11-21 DIAGNOSIS — E1140 Type 2 diabetes mellitus with diabetic neuropathy, unspecified: Secondary | ICD-10-CM | POA: Diagnosis not present

## 2019-11-21 DIAGNOSIS — R42 Dizziness and giddiness: Secondary | ICD-10-CM | POA: Insufficient documentation

## 2019-11-21 DIAGNOSIS — I10 Essential (primary) hypertension: Secondary | ICD-10-CM | POA: Diagnosis not present

## 2019-11-21 DIAGNOSIS — R29818 Other symptoms and signs involving the nervous system: Secondary | ICD-10-CM | POA: Diagnosis not present

## 2019-11-21 DIAGNOSIS — Z87891 Personal history of nicotine dependence: Secondary | ICD-10-CM | POA: Insufficient documentation

## 2019-11-21 LAB — COMPREHENSIVE METABOLIC PANEL
ALT: 18 U/L (ref 0–44)
AST: 23 U/L (ref 15–41)
Albumin: 4.5 g/dL (ref 3.5–5.0)
Alkaline Phosphatase: 48 U/L (ref 38–126)
Anion gap: 12 (ref 5–15)
BUN: 11 mg/dL (ref 8–23)
CO2: 25 mmol/L (ref 22–32)
Calcium: 9.8 mg/dL (ref 8.9–10.3)
Chloride: 101 mmol/L (ref 98–111)
Creatinine, Ser: 0.77 mg/dL (ref 0.61–1.24)
GFR calc Af Amer: >60 mL/min (ref >60)
GFR calc non Af Amer: >60 mL/min (ref >60)
Glucose, Bld: 107 mg/dL — ABNORMAL HIGH (ref 70–99)
Potassium: 3.9 mmol/L (ref 3.5–5.1)
Sodium: 138 mmol/L (ref 135–145)
Total Bilirubin: 1 mg/dL (ref 0.3–1.2)
Total Protein: 8 g/dL (ref 6.5–8.1)

## 2019-11-21 LAB — DIFFERENTIAL
Abs Immature Granulocytes: 0.02 10*3/uL (ref 0.00–0.07)
Basophils Absolute: 0 10*3/uL (ref 0.0–0.1)
Basophils Relative: 0 %
Eosinophils Absolute: 0.4 10*3/uL (ref 0.0–0.5)
Eosinophils Relative: 5 %
Immature Granulocytes: 0 %
Lymphocytes Relative: 25 %
Lymphs Abs: 2.1 10*3/uL (ref 0.7–4.0)
Monocytes Absolute: 0.7 10*3/uL (ref 0.1–1.0)
Monocytes Relative: 8 %
Neutro Abs: 5.1 10*3/uL (ref 1.7–7.7)
Neutrophils Relative %: 62 %

## 2019-11-21 LAB — CBC
HCT: 42 % (ref 39.0–52.0)
Hemoglobin: 14 g/dL (ref 13.0–17.0)
MCH: 28.5 pg (ref 26.0–34.0)
MCHC: 33.3 g/dL (ref 30.0–36.0)
MCV: 85.5 fL (ref 80.0–100.0)
Platelets: 187 10*3/uL (ref 150–400)
RBC: 4.91 MIL/uL (ref 4.22–5.81)
RDW: 13.7 % (ref 11.5–15.5)
WBC: 8.3 10*3/uL (ref 4.0–10.5)
nRBC: 0 % (ref 0.0–0.2)

## 2019-11-21 LAB — PROTIME-INR
INR: 0.9 (ref 0.8–1.2)
Prothrombin Time: 11.7 seconds (ref 11.4–15.2)

## 2019-11-21 LAB — APTT: aPTT: 32 seconds (ref 24–36)

## 2019-11-21 NOTE — ED Provider Notes (Signed)
[facility] Emergency Department Provider Note   ____________________________________________   First MD Initiated Contact with Patient 11/21/19 1800     (approximate)  I have reviewed the triage vital signs and the nursing notes.   HISTORY  Chief Complaint Dizziness    HPI Michael Murphy is a 86 y.o. male with past medical history of hypertension, hyperlipidemia, and diabetes who presents to the ED complaining of lightheadedness and dizziness.  Patient states that for the past 3 to 4 days he has been feeling lightheaded at times, feels like he may lose his balance and fall to the ground.  He has been able to support himself while walking around his house and has never passed out or actually fallen.  He denies any chest pain or shortness of breath with these episodes.  They seem to occur more when he goes to stand up and walk.  He has otherwise been feeling well recently with no fevers, nausea, vomiting, diarrhea, dysuria, or hematuria.  He denies any vision changes, speech changes, numbness, or weakness.        Past Medical History:  Diagnosis Date  . Adiposity 07/12/2014  . Allergic rhinitis 02/11/2007  . Arthritis, degenerative 02/11/2007  . Benign essential HTN 02/11/2007   Goal BP< 130/80 due DMII.   Marland Kitchen CA of prostate ([place]) 07/12/2014  . Carpal tunnel syndrome 07/12/2014  . Diabetes mellitus without complication ([place])   . Diabetic neuropathy ([place]) 02/12/2009   s/p Lifestyle Education Diabetic Classes 2012.  Monofilament intact 09/18/2013-normal bilaterally.   . ED (erectile dysfunction) of organic origin 01/23/2013  . Epididymitis 07/12/2014  . Excessive urination at night 01/23/2013  . Hypercholesterolemia without hypertriglyceridemia 02/11/2007  . Hypertension   . Neurosis, posttraumatic 07/12/2014   ([place]) and ([place]).   . OP (osteoporosis) 02/11/2007    Patient Active Problem List   Diagnosis Date Noted  . Erectile dysfunction after radical  prostatectomy 01/11/2019  . Encysted hydrocele 01/11/2019  . Scrotal pain 05/02/2018  . Right hydrocele 05/02/2018  . Spermatocele 05/02/2018  . Type 2 diabetes mellitus without complication, without long-term current use of insulin ([place]) 05/04/2017  . Abnormal laboratory test 07/12/2014  . Carpal tunnel syndrome 07/12/2014  . Adiposity 07/12/2014  . Neurosis, posttraumatic 07/12/2014  . CA of prostate ([place]) 07/12/2014  . Excessive urination at night 01/23/2013  . ED (erectile dysfunction) of organic origin 01/23/2013  . Malignant neoplasm of prostate ([place]) 12/07/2011  . Personal history of prostate cancer 12/07/2011  . Diabetic neuropathy ([place]) 02/12/2009  . Allergic rhinitis 02/11/2007  . Benign essential HTN 02/11/2007  . Arthritis, degenerative 02/11/2007  . OP (osteoporosis) 02/11/2007  . Hypercholesterolemia without hypertriglyceridemia 02/11/2007  . Current tobacco use 02/11/2007    Past Surgical History:  Procedure Laterality Date  . CATARACT EXTRACTION    . KNEE ARTHROSCOPY     left  . MULTIPLE TOOTH EXTRACTIONS    . PROSTATECTOMY    . ROTATOR CUFF REPAIR      Prior to Admission medications   Medication Sig Start Date End Date Taking? Authorizing Provider  albuterol (PROVENTIL HFA;VENTOLIN HFA) 108 (90 Base) MCG/ACT inhaler Inhale 2 puffs into the lungs every 6 (six) hours as needed for wheezing or shortness of breath. 03/11/17   Jerrol Banana., MD  alendronate (FOSAMAX) 70 MG tablet Take by mouth. Patient not taking: Reported on 10/18/2019 12/07/11   [provider]  amLODipine (NORVASC) 10 MG tablet Take 1 tablet by mouth daily. 10/15/10  [provider]  aspirin 81 MG chewable tablet Chew by mouth daily.    [provider]  calcium carbonate (CALCIUM 600) 600 MG TABS tablet Take 600 mg by mouth daily. 12/07/11   [provider]  FLAXSEED, LINSEED, PO Take 2 capsules by mouth daily. 02/25/10   [provider]   fluticasone (FLONASE) 50 MCG/ACT nasal spray Place 1 spray into both nostrils as needed.  05/16/18   [provider]  gabapentin (NEURONTIN) 100 MG capsule Take 2 capsules (200 mg total) by mouth 2 (two) times daily. 11/15/18   Jerrol Banana., MD  Garlic Oil 6712 MG CAPS Take 1 capsule by mouth daily. 04/14/07   [provider]  hydroxychloroquine (PLAQUENIL) 200 MG tablet Take 400 mg by mouth daily.  07/04/17   [provider]  losartan (COZAAR) 50 MG tablet Take 50 mg by mouth daily.    [provider]  metFORMIN (GLUCOPHAGE) 500 MG tablet Take 1 tablet by mouth daily.    [provider]  Multiple Vitamins-Minerals (MENS MULTIVITAMIN PLUS PO) Take by mouth daily.    [provider]  Omega-3 Fatty Acids (FISH OIL) 1000 MG CAPS Take 1 capsule by mouth daily. 04/14/07   [provider]  PERIOGARD 0.12 % solution  11/10/18   [provider]  simvastatin (ZOCOR) 20 MG tablet Take 1 tablet by mouth at bedtime.    [provider]  SYMBICORT 160-4.5 MCG/ACT inhaler Inhale 2 puffs into the lungs 2 (two) times daily.  04/06/18   [provider]  tadalafil (CIALIS) 10 MG tablet 1 tab 30-60 minutes prior to intercourse Patient not taking: Reported on 08/31/2019 01/11/19   Abbie Sons, MD  tiotropium (SPIRIVA HANDIHALER) 18 MCG inhalation capsule Place 18 mcg into inhaler and inhale every morning.    [provider]  triamcinolone cream (KENALOG) 0.1 % Apply 1 application topically 2 (two) times daily. 07/17/19   Jerrol Banana., MD    Allergies Accupril [place] Canal hcl] and Quinapril  Family History  Problem Relation Age of Onset  . Hypertension Mother   . Stroke Mother   . Diabetes Sister   . Hypertension Sister   . Diabetes Brother   . Diabetes Brother     Social History Social History   Tobacco Use  . Smoking status: Former Smoker    Packs/day: 0.50    Years: 18.00    Pack years:  9.00    Types: Cigarettes    Quit date: 03/09/1991    Years since quitting: 28.7  . Smokeless tobacco: Never Used  Vaping Use  . Vaping Use: Never used  Substance Use Topics  . Alcohol use: Yes    Alcohol/week: 1.0 - 3.0 standard drink    Types: 1 - 3 Cans of beer per week  . Drug use: No    Review of Systems  Constitutional: No fever/chills.  Positive for lightheadedness and dizziness. Eyes: No visual changes. ENT: No sore throat. Cardiovascular: Denies chest pain. Respiratory: Denies shortness of breath. Gastrointestinal: No abdominal pain.  No nausea, no vomiting.  No diarrhea.  No constipation. Genitourinary: Negative for dysuria. Musculoskeletal: Negative for back pain. Skin: Negative for rash. Neurological: Negative for headaches, focal weakness or numbness.  ____________________________________________   PHYSICAL EXAM:  VITAL SIGNS: ED Triage Vitals [11/21/19 1148]  Enc Vitals Group     BP (!) 144/81     Pulse Rate 80     Resp 16  Temp 98.3 F (36.8 C)     Temp Source Oral     SpO2 96 %     Weight 200 lb (90.7 kg)     Height 5\' 8"  (1.727 m)     Head Circumference      Peak Flow      Pain Score 0     Pain Loc      Pain Edu?      Excl. in [place]?     Constitutional: Alert and oriented. Eyes: Conjunctivae are normal. Head: Atraumatic. Nose: No congestion/rhinnorhea. Mouth/Throat: Mucous membranes are moist. Neck: Normal ROM Cardiovascular: Normal rate, regular rhythm. Grossly normal heart sounds. Respiratory: Normal respiratory effort.  No retractions. Lungs CTAB. Gastrointestinal: Soft and nontender. No distention. Genitourinary: deferred Musculoskeletal: No lower extremity tenderness nor edema. Neurologic:  Normal speech and language. No gross focal neurologic deficits are appreciated. Skin:  Skin is warm, dry and intact. No rash noted. Psychiatric: Mood and affect are normal. Speech and behavior are  normal.  ____________________________________________   LABS (all labs ordered are listed, but only abnormal results are displayed)  Labs Reviewed  COMPREHENSIVE METABOLIC PANEL - Abnormal; Notable for the following components:      Result Value   Glucose, Bld 107 (*)    All other components within normal limits  PROTIME-INR  APTT  CBC  DIFFERENTIAL  CBG MONITORING, ED   ____________________________________________  EKG  ED ECG REPORT I, Blake Divine, the attending physician, personally viewed and interpreted this ECG.   Date: 11/21/2019  EKG Time: 11:42  Rate: 79  Rhythm: normal sinus rhythm  Axis: LAD  Intervals:left anterior fascicular block  ST&T Change: None   PROCEDURES  Procedure(s) performed (including Critical Care):  Procedures   ____________________________________________   INITIAL IMPRESSION / ASSESSMENT AND PLAN / ED COURSE       86 year old male with possible history of hypertension, hyperlipidemia, and diabetes who presents to the ED complaining of lightheadedness and dizziness especially when going to stand and walk.  He has no focal neurologic deficits on exam and describes the dizziness as a lightheadedness rather than vertiginous and I doubt a stroke as the cause of his symptoms.  He has no chest pain or shortness of breath, EKG is unremarkable and I doubt cardiac etiology for his symptoms.  Remainder of lab work is reassuring, we will check orthostatic vital signs and if these are positive hydrate patient with IV fluids.  If they are negative and he is able to stand and ambulate without difficulty, he would be appropriate for discharge home with PCP follow-up.  Orthostatic vital signs are within normal limits and patient states he was able to raise to a standing position without any lightheadedness or dizziness. Cardiac monitor has been unremarkable and at this point patient is appropriate for discharge home with PCP follow-up. He was counseled  to return to the ED for any new or worsening symptoms, patient agrees with plan.      ____________________________________________   FINAL CLINICAL IMPRESSION(S) / ED DIAGNOSES  Final diagnoses:  Lightheadedness     ED Discharge Orders    None       Note:  This document was prepared using Dragon voice recognition software and may include unintentional dictation errors.   Blake Divine, MD 11/21/19 [phone]

## 2019-11-21 NOTE — ED Triage Notes (Signed)
Pt in via POV, reports ongoing light headedness and dizziness, causing imbalanced gait.  Denies any changes to speech, or vision.  Pt neurologically intact otherwise.

## 2019-11-21 NOTE — ED Notes (Signed)
[place] sent to the lab.  

## 2019-11-22 ENCOUNTER — Encounter: Payer: Self-pay | Admitting: Emergency Medicine

## 2019-11-22 ENCOUNTER — Encounter: Payer: Medicare Other | Admitting: Family Medicine

## 2019-11-22 ENCOUNTER — Other Ambulatory Visit: Payer: Self-pay

## 2019-11-22 ENCOUNTER — Telehealth: Payer: Self-pay

## 2019-11-22 ENCOUNTER — Emergency Department
Admission: EM | Admit: 2019-11-22 | Discharge: 2019-11-22 | Disposition: A | Discharge status: Home / Self Care | Payer: Medicare Other | Attending: Emergency Medicine | Admitting: Emergency Medicine

## 2019-11-22 DIAGNOSIS — Z20822 Contact with and (suspected) exposure to covid-19: Secondary | ICD-10-CM | POA: Insufficient documentation

## 2019-11-22 DIAGNOSIS — Z7984 Long term (current) use of oral hypoglycemic drugs: Secondary | ICD-10-CM | POA: Insufficient documentation

## 2019-11-22 DIAGNOSIS — Z79899 Other long term (current) drug therapy: Secondary | ICD-10-CM | POA: Insufficient documentation

## 2019-11-22 DIAGNOSIS — E1140 Type 2 diabetes mellitus with diabetic neuropathy, unspecified: Secondary | ICD-10-CM | POA: Diagnosis not present

## 2019-11-22 DIAGNOSIS — R42 Dizziness and giddiness: Secondary | ICD-10-CM | POA: Diagnosis not present

## 2019-11-22 DIAGNOSIS — Z7982 Long term (current) use of aspirin: Secondary | ICD-10-CM | POA: Insufficient documentation

## 2019-11-22 DIAGNOSIS — I10 Essential (primary) hypertension: Secondary | ICD-10-CM | POA: Diagnosis not present

## 2019-11-22 DIAGNOSIS — Z8546 Personal history of malignant neoplasm of prostate: Secondary | ICD-10-CM | POA: Insufficient documentation

## 2019-11-22 LAB — BASIC METABOLIC PANEL
Anion gap: 10 (ref 5–15)
BUN: 13 mg/dL (ref 8–23)
CO2: 26 mmol/L (ref 22–32)
Calcium: 9.5 mg/dL (ref 8.9–10.3)
Chloride: 101 mmol/L (ref 98–111)
Creatinine, Ser: 0.88 mg/dL (ref 0.61–1.24)
GFR calc Af Amer: >60 mL/min (ref >60)
GFR calc non Af Amer: >60 mL/min (ref >60)
Glucose, Bld: 111 mg/dL — ABNORMAL HIGH (ref 70–99)
Potassium: 4.1 mmol/L (ref 3.5–5.1)
Sodium: 137 mmol/L (ref 135–145)

## 2019-11-22 LAB — CBC
HCT: 42.3 % (ref 39.0–52.0)
Hemoglobin: 13.9 g/dL (ref 13.0–17.0)
MCH: 28.7 pg (ref 26.0–34.0)
MCHC: 32.9 g/dL (ref 30.0–36.0)
MCV: 87.2 fL (ref 80.0–100.0)
Platelets: 180 10*3/uL (ref 150–400)
RBC: 4.85 MIL/uL (ref 4.22–5.81)
RDW: 13.6 % (ref 11.5–15.5)
WBC: 7.8 10*3/uL (ref 4.0–10.5)
nRBC: 0 % (ref 0.0–0.2)

## 2019-11-22 LAB — SARS CORONAVIRUS 2 BY RT PCR (HOSPITAL ORDER, PERFORMED IN ~~LOC~~ HOSPITAL LAB): SARS Coronavirus 2: NEGATIVE

## 2019-11-22 NOTE — Progress Notes (Signed)
Trena Platt Sumire Halbleib,acting as a scribe for [name], MD.,have documented all relevant documentation on the behalf of [name], MD,as directed by  [name], MD while in the presence of [name], MD.  Established patient visit   Patient: Michael Murphy   DOB: [birth year]   [name]  MRN: [id number] Visit Date: 11/23/2019  Today's healthcare provider: [name], MD   Chief Complaint  Patient presents with  . ER follow up  . Dizziness   Subjective    HPI   Follow up ER visit Patient was evaluated in the ED on the 14th of 15th of last week.  Work-up was "completely normal including CT scan of the head.  He feels a little bit woozy now but no dizziness.  He is feeling better.  He is accompanied by his daughter today.  She states that he is acting better. Patient was seen in ER for Dizziness/ Lightheadedness on 11/21/19. Treatment for this included see chart notes. He reports good compliance with treatment. He reports this condition is Improved.  -----------------------------------------------------------------------------------------  Patient has not had any dizziness today, maybe a little bit.        Medications: Outpatient Medications Prior to Visit  Medication Sig  . albuterol (PROVENTIL HFA;VENTOLIN HFA) 108 (90 Base) MCG/ACT inhaler Inhale 2 puffs into the lungs every 6 (six) hours as needed for wheezing or shortness of breath.  Marland Kitchen alendronate (FOSAMAX) 70 MG tablet Take by mouth.   Marland Kitchen amLODipine (NORVASC) 10 MG tablet Take 1 tablet by mouth daily.  Marland Kitchen aspirin 81 MG chewable tablet Chew by mouth daily.  . calcium carbonate (CALCIUM 600) 600 MG TABS tablet Take 600 mg by mouth daily.  Marland Kitchen FLAXSEED, LINSEED, PO Take 2 capsules by mouth daily.  . fluticasone (FLONASE) 50 MCG/ACT nasal spray Place 1 spray into both nostrils as needed.   . gabapentin (NEURONTIN) 100 MG capsule Take 2 capsules (200 mg total) by mouth 2 (two) times  daily.  . Garlic Oil 1027 MG CAPS Take 1 capsule by mouth daily.  . hydroxychloroquine (PLAQUENIL) 200 MG tablet Take 400 mg by mouth daily.   Marland Kitchen losartan (COZAAR) 50 MG tablet Take 50 mg by mouth daily.  . metFORMIN (GLUCOPHAGE) 500 MG tablet Take 1 tablet by mouth daily.  . Multiple Vitamins-Minerals (MENS MULTIVITAMIN PLUS PO) Take by mouth daily.  . Omega-3 Fatty Acids (FISH OIL) 1000 MG CAPS Take 1 capsule by mouth daily.  Marland Kitchen PERIOGARD 0.12 % solution   . simvastatin (ZOCOR) 20 MG tablet Take 1 tablet by mouth at bedtime.  . SYMBICORT 160-4.5 MCG/ACT inhaler Inhale 2 puffs into the lungs 2 (two) times daily.   . tadalafil (CIALIS) 10 MG tablet 1 tab 30-60 minutes prior to intercourse  . tiotropium (SPIRIVA HANDIHALER) 18 MCG inhalation capsule Place 18 mcg into inhaler and inhale every morning.  . triamcinolone cream (KENALOG) 0.1 % Apply 1 application topically 2 (two) times daily.   No facility-administered medications prior to visit.    Review of Systems     Objective    BP 118/77 (BP Location: Right Arm, Patient Position: Sitting, Cuff Size: Large)   Pulse 89   Temp 98.2 F (36.8 C) (Oral)   Wt 205 lb 3.2 oz (93.1 kg)   BMI 31.20 kg/m  BP Readings from Last 3 Encounters:  11/27/19 (!) 174/64  11/23/19 118/77  11/22/19 132/66   Wt Readings from Last 3 Encounters:  11/27/19  205 lb 3.2 oz (93.1 kg)  11/23/19 205 lb 3.2 oz (93.1 kg)  11/22/19 200 lb (90.7 kg)      Physical Exam Vitals reviewed.  Constitutional:      Appearance: He is well-developed.     Comments: Patient appears younger than his age of 57  HENT:     Head: Normocephalic and atraumatic.     Right Ear: External ear normal.     Left Ear: External ear normal.     Nose: Nose normal.  Eyes:     General: No scleral icterus.    Conjunctiva/sclera: Conjunctivae normal.  Neck:     Thyroid: No thyromegaly.  Cardiovascular:     Rate and Rhythm: Normal rate and regular rhythm.     Heart sounds: Normal  heart sounds.  Pulmonary:     Effort: Pulmonary effort is normal.     Breath sounds: Normal breath sounds.  Abdominal:     Palpations: Abdomen is soft.  Musculoskeletal:     Right lower leg: Edema present.     Left lower leg: Edema present.     Comments: 1+ bilateral lower ext edema  Skin:    General: Skin is warm and dry.  Neurological:     General: No focal deficit present.     Mental Status: He is alert and oriented to person, place, and time.     Sensory: No sensory deficit.     Motor: No weakness.     Coordination: Coordination normal.     Comments: Minimal nystagmus.Gait normal.  Psychiatric:        Mood and Affect: Mood normal.        Behavior: Behavior normal.        Thought Content: Thought content normal.        Judgment: Judgment normal.       No results found for any visits on 11/23/19.  Assessment & Plan     1. Lightheadedness Normal exam today.  He seems to be improving.  No adjustments in medications today is is 118/77 today.  2. COPD exacerbation (HCC) Clinically stable  3. h/oCA of prostate ([place])   4. Benign essential HTN Good control today  5. Other diabetic neurological complication associated with type 2 diabetes mellitus ([place])    No follow-ups on file.      I, [name], MD, have reviewed all documentation for this visit. The documentation on 11/27/19 for the exam, diagnosis, procedures, and orders are all accurate and complete.    Richard Cranford Mon, MD  [facility] [phone] (phone) [phone] (fax)  [place]

## 2019-11-22 NOTE — ED Notes (Signed)
Upon assessment pt eating a sandwich w/o difficulty.

## 2019-11-22 NOTE — Telephone Encounter (Signed)
Patient scheduled for appointment tomorrow.  

## 2019-11-22 NOTE — ED Triage Notes (Signed)
Pt in via POV, reports ongoing dizziness and nausea x a few days.  Pt seen here for same yesterday; exam unremarkable.  Attempted to follow up with PCP but states, "They couldn't see me until 3:40 tomorrow so I just told my daughter to bring me on over here."  Pt neurologically intact, vitals WDL, NAD noted at this time.

## 2019-11-22 NOTE — ED Provider Notes (Signed)
[facility] Emergency Department Provider Note   ____________________________________________   First MD Initiated Contact with Patient 11/22/19 1541     (approximate)  I have reviewed the triage vital signs and the nursing notes.   HISTORY  Chief Complaint Dizziness    HPI Michael Murphy is a 86 y.o. male with a stated past medical history of hypertension, diabetes, and hyperlipidemia who presents for continued symptoms of dizziness that is worsened with getting up from a seated position.  Patient was seen for the symptoms yesterday and completed a cardiac work-up that was unremarkable.  Patient states that he is having brief episodes of lightheadedness whenever he is getting up from a seated position or from laying down to sitting up.  Patient states that the symptoms last approximately 5-10 seconds and resolve spontaneously.  Patient does endorse poor p.o. intake but denies any diuretic medications.  Patient denies any chest pain, shortness of breath, palpitations, vision changes, tinnitus, abdominal pain, dysuria, nausea/vomiting/diarrhea, or recent sick contacts.         Past Medical History:  Diagnosis Date  . Adiposity 07/12/2014  . Allergic rhinitis 02/11/2007  . Arthritis, degenerative 02/11/2007  . Benign essential HTN 02/11/2007   Goal BP< 130/80 due DMII.   Marland Kitchen CA of prostate ([place]) 07/12/2014  . Carpal tunnel syndrome 07/12/2014  . Diabetes mellitus without complication ([place])   . Diabetic neuropathy ([place]) 02/12/2009   s/p Lifestyle Education Diabetic Classes 2012.  Monofilament intact 09/18/2013-normal bilaterally.   . ED (erectile dysfunction) of organic origin 01/23/2013  . Epididymitis 07/12/2014  . Excessive urination at night 01/23/2013  . Hypercholesterolemia without hypertriglyceridemia 02/11/2007  . Hypertension   . Neurosis, posttraumatic 07/12/2014   ([place] [facility]) and ([place]).   . OP (osteoporosis) 02/11/2007    Patient Active Problem List    Diagnosis Date Noted  . Erectile dysfunction after radical prostatectomy 01/11/2019  . Encysted hydrocele 01/11/2019  . Scrotal pain 05/02/2018  . Right hydrocele 05/02/2018  . Spermatocele 05/02/2018  . Type 2 diabetes mellitus without complication, without long-term current use of insulin ([place]) 05/04/2017  . Abnormal laboratory test 07/12/2014  . Carpal tunnel syndrome 07/12/2014  . Adiposity 07/12/2014  . Neurosis, posttraumatic 07/12/2014  . CA of prostate ([place]) 07/12/2014  . Excessive urination at night 01/23/2013  . ED (erectile dysfunction) of organic origin 01/23/2013  . Malignant neoplasm of prostate ([place]) 12/07/2011  . Personal history of prostate cancer 12/07/2011  . Diabetic neuropathy ([place]) 02/12/2009  . Allergic rhinitis 02/11/2007  . Benign essential HTN 02/11/2007  . Arthritis, degenerative 02/11/2007  . OP (osteoporosis) 02/11/2007  . Hypercholesterolemia without hypertriglyceridemia 02/11/2007  . Current tobacco use 02/11/2007    Past Surgical History:  Procedure Laterality Date  . CATARACT EXTRACTION    . KNEE ARTHROSCOPY     left  . MULTIPLE TOOTH EXTRACTIONS    . PROSTATECTOMY    . ROTATOR CUFF REPAIR      Prior to Admission medications   Medication Sig Start Date End Date Taking? Authorizing Provider  albuterol (PROVENTIL HFA;VENTOLIN HFA) 108 (90 Base) MCG/ACT inhaler Inhale 2 puffs into the lungs every 6 (six) hours as needed for wheezing or shortness of breath. 03/11/17   Jerrol Banana., MD  alendronate (FOSAMAX) 70 MG tablet Take by mouth. Patient not taking: Reported on 10/18/2019 12/07/11   [provider]  amLODipine (NORVASC) 10 MG tablet Take 1 tablet by mouth daily. 10/15/10   [provider]  aspirin  81 MG chewable tablet Chew by mouth daily.    [provider]  calcium carbonate (CALCIUM 600) 600 MG TABS tablet Take 600 mg by mouth daily. 12/07/11   [provider]  FLAXSEED, LINSEED, PO Take 2  capsules by mouth daily. 02/25/10   [provider]  fluticasone (FLONASE) 50 MCG/ACT nasal spray Place 1 spray into both nostrils as needed.  05/16/18   [provider]  gabapentin (NEURONTIN) 100 MG capsule Take 2 capsules (200 mg total) by mouth 2 (two) times daily. 11/15/18   Jerrol Banana., MD  Garlic Oil 0938 MG CAPS Take 1 capsule by mouth daily. 04/14/07   [provider]  hydroxychloroquine (PLAQUENIL) 200 MG tablet Take 400 mg by mouth daily.  07/04/17   [provider]  losartan (COZAAR) 50 MG tablet Take 50 mg by mouth daily.    [provider]  metFORMIN (GLUCOPHAGE) 500 MG tablet Take 1 tablet by mouth daily.    [provider]  Multiple Vitamins-Minerals (MENS MULTIVITAMIN PLUS PO) Take by mouth daily.    [provider]  Omega-3 Fatty Acids (FISH OIL) 1000 MG CAPS Take 1 capsule by mouth daily. 04/14/07   [provider]  PERIOGARD 0.12 % solution  11/10/18   [provider]  simvastatin (ZOCOR) 20 MG tablet Take 1 tablet by mouth at bedtime.    [provider]  SYMBICORT 160-4.5 MCG/ACT inhaler Inhale 2 puffs into the lungs 2 (two) times daily.  04/06/18   [provider]  tadalafil (CIALIS) 10 MG tablet 1 tab 30-60 minutes prior to intercourse Patient not taking: Reported on 08/31/2019 01/11/19   Abbie Sons, MD  tiotropium (SPIRIVA HANDIHALER) 18 MCG inhalation capsule Place 18 mcg into inhaler and inhale every morning.    [provider]  triamcinolone cream (KENALOG) 0.1 % Apply 1 application topically 2 (two) times daily. 07/17/19   Jerrol Banana., MD    Allergies Accupril [place] Canal hcl] and Quinapril  Family History  Problem Relation Age of Onset  . Hypertension Mother   . Stroke Mother   . Diabetes Sister   . Hypertension Sister   . Diabetes Brother   . Diabetes Brother     Social History Social History   Tobacco Use  . Smoking status: Former  Smoker    Packs/day: 0.50    Years: 18.00    Pack years: 9.00    Types: Cigarettes    Quit date: 03/09/1991    Years since quitting: 28.7  . Smokeless tobacco: Never Used  Vaping Use  . Vaping Use: Never used  Substance Use Topics  . Alcohol use: Yes    Alcohol/week: 1.0 - 3.0 standard drink    Types: 1 - 3 Cans of beer per week  . Drug use: No    Review of Systems Constitutional: No fever/chills Eyes: No visual changes. ENT: No sore throat. Cardiovascular: Denies chest pain. Respiratory: Denies shortness of breath. Gastrointestinal: No abdominal pain.  No nausea, no vomiting.  No diarrhea. Genitourinary: Negative for dysuria. Musculoskeletal: Negative for acute arthralgias Skin: Negative for rash. Neurological: Negative for headaches, weakness/numbness/paresthesias in any extremity Psychiatric: Negative for suicidal ideation/homicidal ideation   ____________________________________________   PHYSICAL EXAM:  VITAL SIGNS: ED Triage Vitals  Enc Vitals Group     BP 11/22/19 1238 133/70     Pulse Rate 11/22/19 1238 79     Resp 11/22/19 1238 16     Temp 11/22/19 1238 98.2  F (36.8 C)     Temp Source 11/22/19 1238 Oral     SpO2 11/22/19 1238 97 %     Weight 11/22/19 1238 200 lb (90.7 kg)     Height 11/22/19 1238 5\' 8"  (1.727 m)     Head Circumference --      Peak Flow --      Pain Score 11/22/19 1244 0     Pain Loc --      Pain Edu? --      Excl. in [place]? --    Constitutional: Alert and oriented. Well appearing and in no acute distress. Eyes: Conjunctivae are normal. PERRL. EOMI. Head: Atraumatic. Nose: No congestion/rhinnorhea. Mouth/Throat: Mucous membranes are moist. Neck: No stridor Cardiovascular: Normal rate, regular rhythm. Grossly normal heart sounds.  Good peripheral circulation. Respiratory: Normal respiratory effort.  No retractions. Gastrointestinal: Soft and nontender. No distention. Musculoskeletal: No lower extremity tenderness nor edema.  No  joint effusions. Neurologic:  Normal speech and language. No gross focal neurologic deficits are appreciated. Skin:  Skin is warm and dry. No rash noted. Psychiatric: Mood and affect are normal. Speech and behavior are normal.  ____________________________________________   LABS (all labs ordered are listed, but only abnormal results are displayed)  Labs Reviewed  BASIC METABOLIC PANEL - Abnormal; Notable for the following components:      Result Value   Glucose, Bld 111 (*)    All other components within normal limits  SARS CORONAVIRUS 2 BY RT PCR (HOSPITAL ORDER, [facility])  CBC  CBG MONITORING, ED   ____________________________________________  EKG  ED ECG REPORT I, Naaman Plummer, the attending physician, personally viewed and interpreted this ECG.  Date: 11/22/2019 EKG Time: 1240 Rate: 74 Rhythm: normal sinus rhythm QRS Axis: LAD Intervals: Left anterior fascicular block ST/T Wave abnormalities: normal Narrative Interpretation: no evidence of acute ischemia _________________________________   PROCEDURES  Procedure(s) performed (including Critical Care):  Procedures   ____________________________________________   INITIAL IMPRESSION / ASSESSMENT AND PLAN / ED COURSE        Patient presents with complaints of orthostatic lightheadedness ED Workup:  CBC, BMP, Troponin,  ECG Differential diagnosis includes HF, ICH, seizure, stroke, HOCM, ACS, aortic dissection, malignant arrhythmia, or GI bleed. Findings: No evidence of acute laboratory abnormalities.  Troponin negative x1 EKG: No e/o STEMI. No evidence of Brugadas sign, delta wave, epsilon wave, significantly prolonged QTc, or malignant arrhythmia.  Patient has no complaints of new symptoms since a negative work-up yesterday.   Disposition: Discharge. Patient is at baseline at this time. Return precautions expressed and understood in person. Advised follow up with primary care  provider or clinic physician in next 24 hours.      ____________________________________________   FINAL CLINICAL IMPRESSION(S) / ED DIAGNOSES  Final diagnoses:  Lightheadedness     ED Discharge Orders    None       Note:  This document was prepared using Dragon voice recognition software and may include unintentional dictation errors.   Naaman Plummer, MD 11/22/19 [phone]

## 2019-11-22 NOTE — Telephone Encounter (Signed)
Copied from [place] [id number]. Topic: General - Other >> Nov 22, 2019 10:36 AM Celene Kras wrote: Reason for CRM: Pts daughter called and is requesting to be put on waitlist for cancellations. She states that the pt was seen in ER yesterday and that he is still feeling symptoms of lightheadedness and dizziness. Please advise.

## 2019-11-23 ENCOUNTER — Encounter: Payer: Self-pay | Admitting: Family Medicine

## 2019-11-23 ENCOUNTER — Ambulatory Visit (INDEPENDENT_AMBULATORY_CARE_PROVIDER_SITE_OTHER): Payer: Medicare Other | Admitting: Family Medicine

## 2019-11-23 ENCOUNTER — Other Ambulatory Visit: Payer: Self-pay

## 2019-11-23 VITALS — BP 118/77 | HR 89 | Temp 98.2°F | Wt 205.2 lb

## 2019-11-23 DIAGNOSIS — E1149 Type 2 diabetes mellitus with other diabetic neurological complication: Secondary | ICD-10-CM | POA: Diagnosis not present

## 2019-11-23 DIAGNOSIS — R42 Dizziness and giddiness: Secondary | ICD-10-CM | POA: Diagnosis not present

## 2019-11-23 DIAGNOSIS — C61 Malignant neoplasm of prostate: Secondary | ICD-10-CM | POA: Diagnosis not present

## 2019-11-23 DIAGNOSIS — I10 Essential (primary) hypertension: Secondary | ICD-10-CM

## 2019-11-23 DIAGNOSIS — J441 Chronic obstructive pulmonary disease with (acute) exacerbation: Secondary | ICD-10-CM | POA: Diagnosis not present

## 2019-11-27 ENCOUNTER — Other Ambulatory Visit: Payer: Self-pay

## 2019-11-27 ENCOUNTER — Emergency Department: Payer: Medicare Other

## 2019-11-27 ENCOUNTER — Emergency Department
Admission: EM | Admit: 2019-11-27 | Discharge: 2019-11-28 | Disposition: A | Discharge status: Home / Self Care | Payer: Medicare Other | Attending: Emergency Medicine | Admitting: Emergency Medicine

## 2019-11-27 ENCOUNTER — Encounter: Payer: Self-pay | Admitting: Emergency Medicine

## 2019-11-27 DIAGNOSIS — Z79899 Other long term (current) drug therapy: Secondary | ICD-10-CM | POA: Diagnosis not present

## 2019-11-27 DIAGNOSIS — Z7982 Long term (current) use of aspirin: Secondary | ICD-10-CM | POA: Diagnosis not present

## 2019-11-27 DIAGNOSIS — J441 Chronic obstructive pulmonary disease with (acute) exacerbation: Secondary | ICD-10-CM | POA: Diagnosis not present

## 2019-11-27 DIAGNOSIS — R42 Dizziness and giddiness: Secondary | ICD-10-CM | POA: Diagnosis not present

## 2019-11-27 DIAGNOSIS — Z87891 Personal history of nicotine dependence: Secondary | ICD-10-CM | POA: Diagnosis not present

## 2019-11-27 DIAGNOSIS — Z7984 Long term (current) use of oral hypoglycemic drugs: Secondary | ICD-10-CM | POA: Diagnosis not present

## 2019-11-27 DIAGNOSIS — Z8549 Personal history of malignant neoplasm of other male genital organs: Secondary | ICD-10-CM | POA: Insufficient documentation

## 2019-11-27 DIAGNOSIS — I10 Essential (primary) hypertension: Secondary | ICD-10-CM | POA: Insufficient documentation

## 2019-11-27 DIAGNOSIS — E1140 Type 2 diabetes mellitus with diabetic neuropathy, unspecified: Secondary | ICD-10-CM | POA: Insufficient documentation

## 2019-11-27 LAB — BASIC METABOLIC PANEL
Anion gap: 11 (ref 5–15)
BUN: 9 mg/dL (ref 8–23)
CO2: 26 mmol/L (ref 22–32)
Calcium: 9.5 mg/dL (ref 8.9–10.3)
Chloride: 103 mmol/L (ref 98–111)
Creatinine, Ser: 0.76 mg/dL (ref 0.61–1.24)
GFR calc Af Amer: >60 mL/min (ref >60)
GFR calc non Af Amer: >60 mL/min (ref >60)
Glucose, Bld: 111 mg/dL — ABNORMAL HIGH (ref 70–99)
Potassium: 3.8 mmol/L (ref 3.5–5.1)
Sodium: 140 mmol/L (ref 135–145)

## 2019-11-27 LAB — CBC
HCT: 43.2 % (ref 39.0–52.0)
Hemoglobin: 14.6 g/dL (ref 13.0–17.0)
MCH: 29.4 pg (ref 26.0–34.0)
MCHC: 33.8 g/dL (ref 30.0–36.0)
MCV: 86.9 fL (ref 80.0–100.0)
Platelets: 175 10*3/uL (ref 150–400)
RBC: 4.97 MIL/uL (ref 4.22–5.81)
RDW: 13.6 % (ref 11.5–15.5)
WBC: 10.3 10*3/uL (ref 4.0–10.5)
nRBC: 0 % (ref 0.0–0.2)

## 2019-11-27 LAB — TROPONIN I (HIGH SENSITIVITY): Troponin I (High Sensitivity): 5 ng/L (ref <18)

## 2019-11-27 MED ORDER — LORAZEPAM 2 MG/ML IJ SOLN
1.0000 mg | INTRAMUSCULAR | Status: DC | PRN
  Administered 2019-11-27: 1 mg via INTRAVENOUS
  Filled 2019-11-27: qty 1

## 2019-11-27 NOTE — ED Provider Notes (Signed)
[facility] Emergency Department Provider Note   ____________________________________________   First MD Initiated Contact with Patient 11/27/19 2031     (approximate)  I have reviewed the triage vital signs and the nursing notes.   HISTORY  Chief Complaint Dizziness    HPI Michael Murphy is a 86 y.o. male with possible history of hypertension, hyperlipidemia, and diabetes who presents to the ED complaining of dizziness.  Patient reports he has been having ongoing episodes of dizziness for greater than 1 week.  He states he will sometimes feel lightheaded but he can also feel like the room is spinning around him.  He feels like he has been unsteady on his feet recently and has to support himself with the railing or wall when he walks.  Symptoms are exacerbated when he goes to stand and move around, but he denies any vision changes, speech changes, numbness, or weakness.  He has not had any fevers, cough, chest pain, or shortness of breath.  He has had 2 prior ED visits for similar symptoms with unremarkable work-up at that time.        Past Medical History:  Diagnosis Date  . Adiposity 07/12/2014  . Allergic rhinitis 02/11/2007  . Arthritis, degenerative 02/11/2007  . Benign essential HTN 02/11/2007   Goal BP< 130/80 due DMII.   Marland Kitchen CA of prostate ([place]) 07/12/2014  . Carpal tunnel syndrome 07/12/2014  . Diabetes mellitus without complication ([place])   . Diabetic neuropathy ([place]) 02/12/2009   s/p Lifestyle Education Diabetic Classes 2012.  Monofilament intact 09/18/2013-normal bilaterally.   . ED (erectile dysfunction) of organic origin 01/23/2013  . Epididymitis 07/12/2014  . Excessive urination at night 01/23/2013  . Hypercholesterolemia without hypertriglyceridemia 02/11/2007  . Hypertension   . Neurosis, posttraumatic 07/12/2014   ([place]) and ([place]).   . OP (osteoporosis) 02/11/2007    Patient Active Problem List   Diagnosis Date Noted  . COPD  exacerbation ([place]) 11/27/2019  . Erectile dysfunction after radical prostatectomy 01/11/2019  . Encysted hydrocele 01/11/2019  . Scrotal pain 05/02/2018  . Right hydrocele 05/02/2018  . Spermatocele 05/02/2018  . Type 2 diabetes mellitus without complication, without long-term current use of insulin ([place]) 05/04/2017  . Abnormal laboratory test 07/12/2014  . Carpal tunnel syndrome 07/12/2014  . Adiposity 07/12/2014  . Neurosis, posttraumatic 07/12/2014  . CA of prostate ([place]) 07/12/2014  . Excessive urination at night 01/23/2013  . ED (erectile dysfunction) of organic origin 01/23/2013  . Malignant neoplasm of prostate ([place]) 12/07/2011  . Personal history of prostate cancer 12/07/2011  . Diabetic neuropathy ([place]) 02/12/2009  . Allergic rhinitis 02/11/2007  . Benign essential HTN 02/11/2007  . Arthritis, degenerative 02/11/2007  . OP (osteoporosis) 02/11/2007  . Hypercholesterolemia without hypertriglyceridemia 02/11/2007  . Current tobacco use 02/11/2007    Past Surgical History:  Procedure Laterality Date  . CATARACT EXTRACTION    . KNEE ARTHROSCOPY     left  . MULTIPLE TOOTH EXTRACTIONS    . PROSTATECTOMY    . ROTATOR CUFF REPAIR      Prior to Admission medications   Medication Sig Start Date End Date Taking? Authorizing Provider  albuterol (PROVENTIL HFA;VENTOLIN HFA) 108 (90 Base) MCG/ACT inhaler Inhale 2 puffs into the lungs every 6 (six) hours as needed for wheezing or shortness of breath. 03/11/17   Jerrol Banana., MD  alendronate (FOSAMAX) 70 MG tablet Take by mouth.  12/07/11   [provider]  amLODipine (NORVASC) 10 MG tablet  Take 1 tablet by mouth daily. 10/15/10   [provider]  aspirin 81 MG chewable tablet Chew by mouth daily.    [provider]  calcium carbonate (CALCIUM 600) 600 MG TABS tablet Take 600 mg by mouth daily. 12/07/11   [provider]  FLAXSEED, LINSEED, PO Take 2 capsules by mouth daily. 02/25/10    [provider]  fluticasone (FLONASE) 50 MCG/ACT nasal spray Place 1 spray into both nostrils as needed.  05/16/18   [provider]  gabapentin (NEURONTIN) 100 MG capsule Take 2 capsules (200 mg total) by mouth 2 (two) times daily. 11/15/18   Jerrol Banana., MD  Garlic Oil 7829 MG CAPS Take 1 capsule by mouth daily. 04/14/07   [provider]  hydroxychloroquine (PLAQUENIL) 200 MG tablet Take 400 mg by mouth daily.  07/04/17   [provider]  losartan (COZAAR) 50 MG tablet Take 50 mg by mouth daily.    [provider]  meclizine (ANTIVERT) 25 MG tablet Take 1 tablet (25 mg total) by mouth 3 (three) times daily as needed for dizziness. 11/28/19   Blake Divine, MD  metFORMIN (GLUCOPHAGE) 500 MG tablet Take 1 tablet by mouth daily.    [provider]  Multiple Vitamins-Minerals (MENS MULTIVITAMIN PLUS PO) Take by mouth daily.    [provider]  Omega-3 Fatty Acids (FISH OIL) 1000 MG CAPS Take 1 capsule by mouth daily. 04/14/07   [provider]  PERIOGARD 0.12 % solution  11/10/18   [provider]  simvastatin (ZOCOR) 20 MG tablet Take 1 tablet by mouth at bedtime.    [provider]  SYMBICORT 160-4.5 MCG/ACT inhaler Inhale 2 puffs into the lungs 2 (two) times daily.  04/06/18   [provider]  tadalafil (CIALIS) 10 MG tablet 1 tab 30-60 minutes prior to intercourse 01/11/19   Stoioff, Ronda Fairly, MD  tiotropium (SPIRIVA HANDIHALER) 18 MCG inhalation capsule Place 18 mcg into inhaler and inhale every morning.    [provider]  triamcinolone cream (KENALOG) 0.1 % Apply 1 application topically 2 (two) times daily. 07/17/19   Jerrol Banana., MD    Allergies Accupril [place] Canal hcl] and Quinapril  Family History  Problem Relation Age of Onset  . Hypertension Mother   . Stroke Mother   . Diabetes Sister   . Hypertension Sister   . Diabetes Brother   . Diabetes Brother      Social History Social History   Tobacco Use  . Smoking status: Former Smoker    Packs/day: 0.50    Years: 18.00    Pack years: 9.00    Types: Cigarettes    Quit date: 03/09/1991    Years since quitting: 28.7  . Smokeless tobacco: Never Used  Vaping Use  . Vaping Use: Never used  Substance Use Topics  . Alcohol use: Yes    Alcohol/week: 1.0 - 3.0 standard drink    Types: 1 - 3 Cans of beer per week  . Drug use: No    Review of Systems  Constitutional: No fever/chills.  Positive for dizziness and lightheadedness. Eyes: No visual changes. ENT: No sore throat. Cardiovascular: Denies chest pain. Respiratory: Denies shortness of breath. Gastrointestinal: No abdominal pain.  No nausea, no vomiting.  No diarrhea.  No constipation. Genitourinary: Negative for dysuria. Musculoskeletal: Negative for back pain. Skin: Negative for rash. Neurological: Negative for headaches, focal weakness or numbness.  Positive for unsteady gait.  ____________________________________________   PHYSICAL  EXAM:  VITAL SIGNS: ED Triage Vitals  Enc Vitals Group     BP 11/27/19 1522 (!) 174/64     Pulse Rate 11/27/19 1521 79     Resp 11/27/19 1521 16     Temp 11/27/19 1521 98 F (36.7 C)     Temp Source 11/27/19 1521 Oral     SpO2 11/27/19 1521 96 %     Weight 11/27/19 1522 205 lb 3.2 oz (93.1 kg)     Height 11/27/19 1522 5\' 8"  (1.727 m)     Head Circumference --      Peak Flow --      Pain Score 11/27/19 1522 0     Pain Loc --      Pain Edu? --      Excl. in [place]? --     Constitutional: Alert and oriented. Eyes: Conjunctivae are normal. Head: Atraumatic. Nose: No congestion/rhinnorhea. Mouth/Throat: Mucous membranes are moist. Neck: Normal ROM Cardiovascular: Normal rate, regular rhythm. Grossly normal heart sounds. Respiratory: Normal respiratory effort.  No retractions. Lungs CTAB. Gastrointestinal: Soft and nontender. No distention. Genitourinary: deferred Musculoskeletal: No  lower extremity tenderness nor edema. Neurologic:  Normal speech and language. No gross focal neurologic deficits are appreciated. Skin:  Skin is warm, dry and intact. No rash noted. Psychiatric: Mood and affect are normal. Speech and behavior are normal.  ____________________________________________   LABS (all labs ordered are listed, but only abnormal results are displayed)  Labs Reviewed  BASIC METABOLIC PANEL - Abnormal; Notable for the following components:      Result Value   Glucose, Bld 111 (*)    All other components within normal limits  CBC  URINALYSIS, COMPLETE (UACMP) WITH MICROSCOPIC  CBG MONITORING, ED  TROPONIN I (HIGH SENSITIVITY)   ____________________________________________  EKG  ED ECG REPORT I, Blake Divine, the attending physician, personally viewed and interpreted this ECG.   Date: 11/27/2019  EKG Time: 15:23  Rate: 80  Rhythm: normal sinus rhythm  Axis: LAD  Intervals:first-degree A-V block  and left anterior fascicular block  ST&T Change: None   PROCEDURES  Procedure(s) performed (including Critical Care):  Procedures   ____________________________________________   INITIAL IMPRESSION / ASSESSMENT AND PLAN / ED COURSE       86 year old male with past medical history of hypertension, hyperlipidemia, and diabetes who presents to the ED complaining of ongoing lightheadedness and dizziness for greater than 1 week.  EKG is unremarkable, lab work reassuring.  He had CT head performed 1 week ago that was negative for acute process, however he is now describing increasing unsteadiness on his feet.  He does not appear to have any focal neurologic deficits on exam, but given ongoing symptoms with this being his third ED visit, we will further assess with MRI to rule out stroke.  MRI is negative for acute process, no evidence of posterior stroke to cause patient's dizziness and unsteadiness.  It is possible he has peripheral vertigo contributing  to his symptoms and we will start him on a trial of meclizine for use as needed.  He was also provided with outpatient referral to ENT and counseled to follow-up with his PCP.  Patient counseled to return to the ED for new worsening symptoms, patient agrees with plan.      ____________________________________________   FINAL CLINICAL IMPRESSION(S) / ED DIAGNOSES  Final diagnoses:  Dizziness  Vertigo     ED Discharge Orders         Ordered    meclizine (ANTIVERT)  25 MG tablet  3 times daily PRN        11/28/19 0003           Note:  This document was prepared using Dragon voice recognition software and may include unintentional dictation errors.   Blake Divine, MD 11/28/19 0005

## 2019-11-27 NOTE — ED Triage Notes (Signed)
Here for dizziness and lightheadedness mostly when standing and moving. Has been here twice for same in last week. Also saw PCP on Saturday for F/U. NAD. VSS. No fever.  No pain.

## 2019-11-27 NOTE — ED Notes (Addendum)
Pt medicated for MRI. Connected to BP cuff and spO2 monitor

## 2019-11-28 MED ORDER — MECLIZINE HCL 25 MG PO TABS
25.0000 mg | ORAL_TABLET | Freq: Three times a day (TID) | ORAL | 0 refills | Status: DC | PRN
Start: 2019-11-28 — End: 2019-12-06

## 2019-12-06 ENCOUNTER — Ambulatory Visit (INDEPENDENT_AMBULATORY_CARE_PROVIDER_SITE_OTHER): Payer: Medicare Other | Admitting: Family Medicine

## 2019-12-06 ENCOUNTER — Encounter: Payer: Self-pay | Admitting: Family Medicine

## 2019-12-06 ENCOUNTER — Other Ambulatory Visit: Payer: Self-pay

## 2019-12-06 VITALS — BP 115/68 | HR 84 | Temp 98.7°F | Ht 68.0 in | Wt 199.0 lb

## 2019-12-06 DIAGNOSIS — I10 Essential (primary) hypertension: Secondary | ICD-10-CM

## 2019-12-06 DIAGNOSIS — R42 Dizziness and giddiness: Secondary | ICD-10-CM

## 2019-12-06 DIAGNOSIS — E1149 Type 2 diabetes mellitus with other diabetic neurological complication: Secondary | ICD-10-CM

## 2019-12-06 MED ORDER — MECLIZINE HCL 25 MG PO TABS: 25.0000 mg | ORAL_TABLET | ORAL | 1 refills | Status: AC | PRN

## 2019-12-06 NOTE — Progress Notes (Signed)
Established patient visit   Patient: Michael Murphy   DOB: 09-20-1927   [name]  MRN: [id number] Visit Date: 12/06/2019  Today's healthcare provider: [name], MD   Chief Complaint  Patient presents with  . Follow-up   Subjective    HPI   Follow up ER visit He still is feels little dizzy/lightheaded.  He is having mild vertigo symptoms but mainly he describes being lightheaded  when he initially stands up from sitting.  He has had no syncope or presyncope but has just woozy feeling.  No palpitations and no chest pain.  He had a negative MRI in the ED on the last visit. The meclizine does help the dizziness a little bit.  His daughter is with him today and she is concerned because he is not really a complainer. Patient was seen in ER for Dizziness on 11/27/2019. He was treated for; Dizziness and Vertigo.  Treatment for this included; see notes in chart. He reports good compliance with treatment. He reports this condition is Improved.  ----------------------------------------------------------------------------------------- Patient says that he is still having some dizziness and lightheadedness. Patient says that his medication helps some but not completely make symptoms go away.    Social History   Tobacco Use  . Smoking status: Former Smoker    Packs/day: 0.50    Years: 18.00    Pack years: 9.00    Types: Cigarettes    Quit date: 03/09/1991    Years since quitting: 28.7  . Smokeless tobacco: Never Used  Vaping Use  . Vaping Use: Never used  Substance Use Topics  . Alcohol use: Yes    Alcohol/week: 1.0 - 3.0 standard drink    Types: 1 - 3 Cans of beer per week  . Drug use: No       Medications: Outpatient Medications Prior to Visit  Medication Sig  . albuterol (PROVENTIL HFA;VENTOLIN HFA) 108 (90 Base) MCG/ACT inhaler Inhale 2 puffs into the lungs every 6 (six) hours as needed for wheezing or shortness of breath.  Marland Kitchen amLODipine (NORVASC) 10 MG  tablet Take 1 tablet by mouth daily.  Marland Kitchen aspirin 81 MG chewable tablet Chew by mouth daily.  . calcium carbonate (CALCIUM 600) 600 MG TABS tablet Take 600 mg by mouth daily.  Marland Kitchen FLAXSEED, LINSEED, PO Take 2 capsules by mouth daily.  . fluticasone (FLONASE) 50 MCG/ACT nasal spray Place 1 spray into both nostrils as needed.   . Garlic Oil 1194 MG CAPS Take 1 capsule by mouth daily.  Marland Kitchen losartan (COZAAR) 50 MG tablet Take 50 mg by mouth daily.  . meclizine (ANTIVERT) 25 MG tablet Take 1 tablet (25 mg total) by mouth 3 (three) times daily as needed for dizziness.  . metFORMIN (GLUCOPHAGE) 500 MG tablet Take 1 tablet by mouth daily.  . Multiple Vitamins-Minerals (MENS MULTIVITAMIN PLUS PO) Take by mouth daily.  . Omega-3 Fatty Acids (FISH OIL) 1000 MG CAPS Take 1 capsule by mouth daily.  Marland Kitchen PERIOGARD 0.12 % solution   . simvastatin (ZOCOR) 20 MG tablet Take 1 tablet by mouth at bedtime.  . SYMBICORT 160-4.5 MCG/ACT inhaler Inhale 2 puffs into the lungs 2 (two) times daily.   . tadalafil (CIALIS) 10 MG tablet 1 tab 30-60 minutes prior to intercourse  . tiotropium (SPIRIVA HANDIHALER) 18 MCG inhalation capsule Place 18 mcg into inhaler and inhale every morning.  . triamcinolone cream (KENALOG) 0.1 % Apply 1 application topically 2 (two) times daily.  Marland Kitchen alendronate (  FOSAMAX) 70 MG tablet Take by mouth.  (Patient not taking: Reported on 12/06/2019)  . gabapentin (NEURONTIN) 100 MG capsule Take 2 capsules (200 mg total) by mouth 2 (two) times daily. (Patient not taking: Reported on 12/06/2019)  . hydroxychloroquine (PLAQUENIL) 200 MG tablet Take 400 mg by mouth daily.  (Patient not taking: Reported on 12/06/2019)   No facility-administered medications prior to visit.    Review of Systems  Constitutional: Negative for appetite change, chills and fever.  Respiratory: Negative for chest tightness, shortness of breath and wheezing.   Cardiovascular: Negative for chest pain and palpitations.  Gastrointestinal:  Negative for abdominal pain, nausea and vomiting.       Objective    BP 115/68 (BP Location: Right Arm, Patient Position: Sitting, Cuff Size: Large)   Pulse 84   Temp 98.7 F (37.1 C) (Oral)   Ht 5\' 8"  (1.727 m)   Wt 199 lb (90.3 kg)   BMI 30.26 kg/m  BP Readings from Last 3 Encounters:  12/06/19 115/68  11/28/19 (!) 162/84  11/23/19 118/77   Wt Readings from Last 3 Encounters:  12/06/19 199 lb (90.3 kg)  11/27/19 205 lb 3.2 oz (93.1 kg)  11/23/19 205 lb 3.2 oz (93.1 kg)      Physical Exam Vitals reviewed.  Constitutional:      Appearance: He is well-developed.     Comments: Patient appears younger than his age of 17  HENT:     Head: Normocephalic and atraumatic.     Right Ear: External ear normal.     Left Ear: External ear normal.     Nose: Nose normal.  Eyes:     General: No scleral icterus.    Conjunctiva/sclera: Conjunctivae normal.  Neck:     Thyroid: No thyromegaly.  Cardiovascular:     Rate and Rhythm: Normal rate and regular rhythm.     Heart sounds: Normal heart sounds.  Pulmonary:     Effort: Pulmonary effort is normal.     Breath sounds: Normal breath sounds.  Abdominal:     Palpations: Abdomen is soft.  Musculoskeletal:     Right lower leg: Edema present.     Left lower leg: Edema present.     Comments: 1+ bilateral lower ext edema  Skin:    General: Skin is warm and dry.  Neurological:     General: No focal deficit present.     Mental Status: He is alert and oriented to person, place, and time.     Sensory: No sensory deficit.     Motor: No weakness.     Coordination: Coordination normal.     Comments: Minimal nystagmus.Gait normal.  Psychiatric:        Mood and Affect: Mood normal.        Behavior: Behavior normal.        Thought Content: Thought content normal.        Judgment: Judgment normal.       No results found for any visits on 12/06/19.  Assessment & Plan     1. Lightheadedness I think this is orthostasis with  standing for the most part.  Arteriosclerosis certainly contributes in his soon-to-be 86 year old.. Discussed making sure when he stands he holds onto something for a few seconds before walking. Refer to cardiology for consideration of work-up such as echo or loop monitor.  I do not think the patient is describing ischemic disease at all. - US Carotid Duplex Bilateral - Ambulatory referral to Cardiology - Ambulatory  referral to ENT - meclizine (ANTIVERT) 25 MG tablet; Take 1 tablet (25 mg total) by mouth as needed for dizziness.  Dispense: 45 tablet; Refill: 1  2. Dizziness Refer to ENT.  Use meclizine as needed. - US Carotid Duplex Bilateral - Ambulatory referral to Cardiology - Ambulatory referral to ENT - meclizine (ANTIVERT) 25 MG tablet; Take 1 tablet (25 mg total) by mouth as needed for dizziness.  Dispense: 45 tablet; Refill: 1  3. Benign essential HTN Good control.  Blood pressures are in a reasonable range for his age.  4. Other diabetic neurological complication associated with type 2 diabetes mellitus ([place]) We will arrange for COVID booster and flu shot in a few weeks.   No follow-ups on file.      I, [name], MD, have reviewed all documentation for this visit. The documentation on 12/07/19 for the exam, diagnosis, procedures, and orders are all accurate and complete.    Richard Cranford Mon, MD  [facility] [phone] (phone) [phone] (fax)  [place]

## 2019-12-12 ENCOUNTER — Ambulatory Visit
Admission: RE | Admit: 2019-12-12 | Discharge: 2019-12-12 | Disposition: A | Discharge status: Home / Self Care | Payer: Medicare Other | Source: Ambulatory Visit | Attending: Family Medicine | Admitting: Family Medicine

## 2019-12-12 ENCOUNTER — Other Ambulatory Visit: Payer: Self-pay

## 2019-12-12 DIAGNOSIS — I6523 Occlusion and stenosis of bilateral carotid arteries: Secondary | ICD-10-CM | POA: Diagnosis not present

## 2019-12-12 DIAGNOSIS — R42 Dizziness and giddiness: Secondary | ICD-10-CM | POA: Insufficient documentation

## 2019-12-13 ENCOUNTER — Telehealth: Payer: Self-pay

## 2019-12-13 NOTE — Telephone Encounter (Signed)
Copied from [place] [id number]. Topic: General - Other >> Dec 13, 2019 11:15 AM Celene Kras wrote: Reason for CRM: Donaciano Eva, pts daughter, called and requesting to speak with PCP regarding a recent test result. Please advise.

## 2019-12-14 NOTE — Telephone Encounter (Signed)
Please advise 

## 2019-12-18 ENCOUNTER — Telehealth: Payer: Self-pay

## 2019-12-18 NOTE — Telephone Encounter (Signed)
Copied from [place] [id number]. Topic: Quick Communication - See Telephone Encounter >> Dec 13, 2019 11:36 AM Loma [place] wrote: CRM for notification. See Telephone encounter for: 12/13/19.ENT Referral pt states this referral was not received by Dr Pollie Friar office but they had rather have a referral to an ENT in [place], request CB to [phone]

## 2019-12-28 ENCOUNTER — Ambulatory Visit (INDEPENDENT_AMBULATORY_CARE_PROVIDER_SITE_OTHER): Payer: Medicare Other

## 2019-12-28 ENCOUNTER — Other Ambulatory Visit: Payer: Self-pay

## 2019-12-28 DIAGNOSIS — Z23 Encounter for immunization: Secondary | ICD-10-CM

## 2020-01-01 ENCOUNTER — Other Ambulatory Visit: Payer: Self-pay

## 2020-01-01 ENCOUNTER — Ambulatory Visit (INDEPENDENT_AMBULATORY_CARE_PROVIDER_SITE_OTHER): Payer: Medicare Other | Admitting: Cardiology

## 2020-01-01 ENCOUNTER — Encounter: Payer: Self-pay | Admitting: Cardiology

## 2020-01-01 VITALS — BP 156/83 | HR 86 | Ht 68.0 in | Wt 191.5 lb

## 2020-01-01 DIAGNOSIS — R42 Dizziness and giddiness: Secondary | ICD-10-CM | POA: Diagnosis not present

## 2020-01-01 DIAGNOSIS — E7800 Pure hypercholesterolemia, unspecified: Secondary | ICD-10-CM

## 2020-01-01 DIAGNOSIS — I10 Essential (primary) hypertension: Secondary | ICD-10-CM | POA: Diagnosis not present

## 2020-01-01 NOTE — Patient Instructions (Signed)

## 2020-01-01 NOTE — Progress Notes (Signed)
Cardiology Office Note:    Date:  01/01/2020   ID:  [name], DOB 09-28-1927, MRN [id number]  PCP:  Jerrol Banana., MD  [facility] Cardiologist:  Kate Sable, MD  [place] Electrophysiologist:  None   Referring MD: Jerrol Banana.,*   Chief Complaint  Patient presents with  . OTHER    Dizziness and left leg numbness, tingling at the feet. Meds reviewed verbally with pt.    History of Present Illness:    Michael Murphy is a 86 y.o. male with a hx of hypertension, hyperlipidemia, former smoker x18 years, COPD who presents due to dizziness.  Patient states having symptoms of dizziness over the past month also.  Symptoms usually occur when he is either walking, standing up from sitting position, or bending over to tie his shoelace.  Symptoms typically do not occur at rest or when he is sitting.  Denies chest pain, shortness of breath, palpitations.  Denies any history of heart disease.  Currently takes amlodipine 5 mg daily for blood pressure.  Also takes a baby aspirin and statin.  Past Medical History:  Diagnosis Date  . Adiposity 07/12/2014  . Allergic rhinitis 02/11/2007  . Arthritis, degenerative 02/11/2007  . Benign essential HTN 02/11/2007   Goal BP< 130/80 due DMII.   Marland Kitchen CA of prostate ([place]) 07/12/2014  . Carpal tunnel syndrome 07/12/2014  . Diabetes mellitus without complication ([place])   . Diabetic neuropathy ([place]) 02/12/2009   s/p Lifestyle Education Diabetic Classes 2012.  Monofilament intact 09/18/2013-normal bilaterally.   . ED (erectile dysfunction) of organic origin 01/23/2013  . Epididymitis 07/12/2014  . Excessive urination at night 01/23/2013  . Hypercholesterolemia without hypertriglyceridemia 02/11/2007  . Hypertension   . Neurosis, posttraumatic 07/12/2014   ([place] [facility]) and ([place]).   . OP (osteoporosis) 02/11/2007    Past Surgical History:  Procedure Laterality Date  . CATARACT EXTRACTION    . KNEE ARTHROSCOPY     left  . MULTIPLE  TOOTH EXTRACTIONS    . PROSTATECTOMY    . ROTATOR CUFF REPAIR      Current Medications: Current Meds  Medication Sig  . albuterol (PROVENTIL HFA;VENTOLIN HFA) 108 (90 Base) MCG/ACT inhaler Inhale 2 puffs into the lungs every 6 (six) hours as needed for wheezing or shortness of breath.  Marland Kitchen alendronate (FOSAMAX) 70 MG tablet Take by mouth.   Marland Kitchen amLODipine (NORVASC) 10 MG tablet Take 0.5 tablets by mouth daily.   Marland Kitchen aspirin 81 MG chewable tablet Chew by mouth daily.  . calcium carbonate (CALCIUM 600) 600 MG TABS tablet Take 600 mg by mouth daily.  Marland Kitchen FLAXSEED, LINSEED, PO Take 2 capsules by mouth daily.  . fluticasone (FLONASE) 50 MCG/ACT nasal spray Place 1 spray into both nostrils as needed.   . Garlic Oil 3016 MG CAPS Take 1 capsule by mouth daily.  . meclizine (ANTIVERT) 25 MG tablet Take 1 tablet (25 mg total) by mouth as needed for dizziness.  . Multiple Vitamins-Minerals (MENS MULTIVITAMIN PLUS PO) Take by mouth daily.  . Omega-3 Fatty Acids (FISH OIL) 1000 MG CAPS Take 1 capsule by mouth daily.  Marland Kitchen PERIOGARD 0.12 % solution   . simvastatin (ZOCOR) 20 MG tablet Take 1 tablet by mouth at bedtime.  . SYMBICORT 160-4.5 MCG/ACT inhaler Inhale 2 puffs into the lungs 2 (two) times daily.   . tadalafil (CIALIS) 10 MG tablet 1 tab 30-60 minutes prior to intercourse  . tiotropium (SPIRIVA HANDIHALER) 18 MCG inhalation capsule  Place 18 mcg into inhaler and inhale every morning.  . triamcinolone cream (KENALOG) 0.1 % Apply 1 application topically 2 (two) times daily.     Allergies:   Accupril [quinapril hcl] and Quinapril   Social History   Socioeconomic History  . Marital status: Widowed    Spouse name: Not on file  . Number of children: 8  . Years of education: Not on file  . Highest education level: GED or equivalent  Occupational History  . Occupation: retired  Tobacco Use  . Smoking status: Former Smoker    Packs/day: 0.50    Years: 18.00    Pack years: 9.00    Types: Cigarettes     Quit date: 03/09/1991    Years since quitting: 28.8  . Smokeless tobacco: Never Used  Vaping Use  . Vaping Use: Never used  Substance and Sexual Activity  . Alcohol use: Yes    Alcohol/week: 1.0 - 3.0 standard drink    Types: 1 - 3 Cans of beer per week  . Drug use: No  . Sexual activity: Yes    Birth control/protection: None  Other Topics Concern  . Not on file  Social History Narrative  . Not on file   Social Determinants of Health   Financial Resource Strain: Low Risk   . Difficulty of Paying Living Expenses: Not hard at all  Food Insecurity: No Food Insecurity  . Worried About Charity fundraiser in the Last Year: Never true  . Ran Out of Food in the Last Year: Never true  Transportation Needs: No Transportation Needs  . Lack of Transportation (Medical): No  . Lack of Transportation (Non-Medical): No  Physical Activity:   . Days of Exercise per Week: Not on file  . Minutes of Exercise per Session: Not on file  Stress: No Stress Concern Present  . Feeling of Stress : Not at all  Social Connections: Moderately Isolated  . Frequency of Communication with Friends and Family: More than three times a week  . Frequency of Social Gatherings with Friends and Family: Once a week  . Attends Religious Services: More than 4 times per year  . Active Member of Clubs or Organizations: No  . Attends Archivist Meetings: Never  . Marital Status: Widowed     Family History: The patient's family history includes Diabetes in his brother, brother, and sister; Hypertension in his mother and sister; Stroke in his mother.  ROS:   Please see the history of present illness.     All other systems reviewed and are negative.  EKGs/Labs/Other Studies Reviewed:    The following studies were reviewed today:   EKG:  EKG is  ordered today.  The ekg ordered today demonstrates sinus rhythm, left axis deviation.  Recent Labs: 07/18/2019: TSH 1.430 11/21/2019: ALT 18 11/27/2019: BUN  9; Creatinine, Ser 0.76; Hemoglobin 14.6; Platelets 175; Potassium 3.8; Sodium 140  Recent Lipid Panel    Component Value Date/Time   CHOL 166 07/18/2019 0838   TRIG 45 07/18/2019 0838   HDL 73 07/18/2019 0838   CHOLHDL 2.3 07/18/2019 0838   LDLCALC 83 07/18/2019 0838     Risk Assessment/Calculations:      Physical Exam:    VS:  BP (!) 156/83 (BP Location: Right Arm, Patient Position: Sitting, Cuff Size: Normal)   Pulse 86   Ht 5\' 8"  (1.727 m)   Wt 191 lb 8 oz (86.9 kg)   SpO2 96%   BMI 29.12 kg/m  Wt Readings from Last 3 Encounters:  01/01/20 191 lb 8 oz (86.9 kg)  12/06/19 199 lb (90.3 kg)  11/27/19 205 lb 3.2 oz (93.1 kg)     GEN:  Well nourished, well developed in no acute distress HEENT: Normal NECK: No JVD; No carotid bruits LYMPHATICS: No lymphadenopathy CARDIAC: RRR, no murmurs, rubs, gallops RESPIRATORY:  Clear to auscultation without rales, wheezing or rhonchi  ABDOMEN: Soft, non-tender, non-distended MUSCULOSKELETAL:  No edema; No deformity  SKIN: Warm and dry NEUROLOGIC:  Alert and oriented x 3 PSYCHIATRIC:  Normal affect   ASSESSMENT:    1. Dizziness   2. Primary hypertension   3. Pure hypercholesterolemia    PLAN:    In order of problems listed above:  1. Patient with a month long history of dizziness.  Symptoms typically occur with changes in position such as sitting to standing, bending over.  Denies any cardiac symptoms of chest pain, shortness of breath, palpitations.  Orthostatic vitals in the office today was negative for orthostasis.  His symptoms are consistent with positional vertigo.  Trial of Antivert as per primary care physician recommended.  Patient advised to be aware of changing positions, be patient when moving from sitting to standing positions.  No indication for additional cardiac testing at this time. 2. History of hypertension, BP reasonable for patient's age.  Continue amlodipine as prescribed. 3. History of  hyperlipidemia, last cholesterol values controlled.  Continue statin as prescribed.  Follow-up as needed   Medication Adjustments/Labs and Tests Ordered: Current medicines are reviewed at length with the patient today.  Concerns regarding medicines are outlined above.  Orders Placed This Encounter  Procedures  . EKG 12-Lead   No orders of the defined types were placed in this encounter.   Patient Instructions  Medication Instructions:  Your physician recommends that you continue on your current medications as directed. Please refer to the Current Medication list given to you today.  *If you need a refill on your cardiac medications before your next appointment, please call your pharmacy*  Follow-Up: At [facility], you and your health needs are our priority.  As part of our continuing mission to provide you with exceptional heart care, we have created designated Provider Care Teams.  These Care Teams include your primary Cardiologist (physician) and Advanced Practice Providers (APPs -  Physician Assistants and Nurse Practitioners) who all work together to provide you with the care you need, when you need it.  We recommend signing up for the patient portal called "MyChart".  Sign up information is provided on this After Visit Summary.  MyChart is used to connect with patients for Virtual Visits (Telemedicine).  Patients are able to view lab/test results, encounter notes, upcoming appointments, etc.  Non-urgent messages can be sent to your provider as well.   To learn more about what you can do with MyChart, go to [email or web address].    Your next appointment:   As needed.      Signed, Kate Sable, MD  01/01/2020 12:35 PM    [place]

## 2020-01-09 ENCOUNTER — Other Ambulatory Visit: Payer: Self-pay | Admitting: Physician Assistant

## 2020-01-09 DIAGNOSIS — R42 Dizziness and giddiness: Secondary | ICD-10-CM | POA: Diagnosis not present

## 2020-01-09 DIAGNOSIS — H903 Sensorineural hearing loss, bilateral: Secondary | ICD-10-CM | POA: Diagnosis not present

## 2020-01-09 DIAGNOSIS — H9042 Sensorineural hearing loss, unilateral, left ear, with unrestricted hearing on the contralateral side: Secondary | ICD-10-CM

## 2020-01-10 ENCOUNTER — Encounter: Payer: Self-pay | Admitting: Urology

## 2020-01-10 ENCOUNTER — Other Ambulatory Visit: Payer: Self-pay

## 2020-01-10 ENCOUNTER — Ambulatory Visit (INDEPENDENT_AMBULATORY_CARE_PROVIDER_SITE_OTHER): Payer: Medicare Other | Admitting: Urology

## 2020-01-10 VITALS — BP 143/70 | HR 105 | Ht 68.0 in | Wt 190.0 lb

## 2020-01-10 DIAGNOSIS — N433 Hydrocele, unspecified: Secondary | ICD-10-CM

## 2020-01-10 DIAGNOSIS — N4340 Spermatocele of epididymis, unspecified: Secondary | ICD-10-CM | POA: Diagnosis not present

## 2020-01-10 DIAGNOSIS — C61 Malignant neoplasm of prostate: Secondary | ICD-10-CM

## 2020-01-10 NOTE — Progress Notes (Signed)
01/10/2020 12:51 PM   Michael Murphy 1928-02-26 [id number]  Referring provider: Jerrol Banana., MD [address] [place],  [place] [address]  Chief Complaint  Patient presents with  . Prostate Cancer    Urologic history: 1.  Prostate cancer -Radical prostatectomy Dr. Quillian Quince 2003; pathologic stage/grade unknown -PSA detectable last several years 0.7-0.9 range  2.  Erectile dysfunction  3.  Hydrocele -Large right hydrocele  4.  Spermatocele -Large right spermatocele  HPI:  86 y.o. male presents for annual follow-up.   Was seen last year for scrotal pain and had a large right hydrocele/spermatocele on scrotal ultrasound however his pain resolved  Remains asymptomatic  No bothersome LUTS  PSA has slowly risen since 2014, last checked 12/2017 and was 1.1     PMH: Past Medical History:  Diagnosis Date  . Adiposity 07/12/2014  . Allergic rhinitis 02/11/2007  . Arthritis, degenerative 02/11/2007  . Benign essential HTN 02/11/2007   Goal BP< 130/80 due DMII.   Marland Kitchen CA of prostate ([place]) 07/12/2014  . Carpal tunnel syndrome 07/12/2014  . Diabetes mellitus without complication ([place])   . Diabetic neuropathy ([place]) 02/12/2009   s/p Lifestyle Education Diabetic Classes 2012.  Monofilament intact 09/18/2013-normal bilaterally.   . ED (erectile dysfunction) of organic origin 01/23/2013  . Epididymitis 07/12/2014  . Excessive urination at night 01/23/2013  . Hypercholesterolemia without hypertriglyceridemia 02/11/2007  . Hypertension   . Neurosis, posttraumatic 07/12/2014   ([place] [facility]) and ([place]).   . OP (osteoporosis) 02/11/2007    Surgical History: Past Surgical History:  Procedure Laterality Date  . CATARACT EXTRACTION    . KNEE ARTHROSCOPY     left  . MULTIPLE TOOTH EXTRACTIONS    . PROSTATECTOMY    . ROTATOR CUFF REPAIR      Home Medications:  Allergies as of 01/10/2020      Reactions   Accupril [quinapril Hcl] Anaphylaxis   Quinapril Swelling       Medication List       Accurate as of January 10, 2020 12:51 PM. If you have any questions, ask your nurse or doctor.        STOP taking these medications   alendronate 70 MG tablet Commonly known as: FOSAMAX Stopped by: Abbie Sons, MD   Periogard 0.12 % solution Generic drug: chlorhexidine Stopped by: Abbie Sons, MD   Symbicort 160-4.5 MCG/ACT inhaler Generic drug: budesonide-formoterol Stopped by: Abbie Sons, MD   tadalafil 10 MG tablet Commonly known as: CIALIS Stopped by: Abbie Sons, MD     TAKE these medications   albuterol 108 (90 Base) MCG/ACT inhaler Commonly known as: VENTOLIN HFA Inhale 2 puffs into the lungs every 6 (six) hours as needed for wheezing or shortness of breath.   amLODipine 10 MG tablet Commonly known as: NORVASC Take 0.5 tablets by mouth daily.   aspirin 81 MG chewable tablet Chew by mouth daily.   Calcium 600 600 MG Tabs tablet Generic drug: calcium carbonate Take 600 mg by mouth daily.   Fish Oil 1000 MG Caps Take 1 capsule by mouth daily.   FLAXSEED (LINSEED) PO Take 2 capsules by mouth daily.   fluticasone 50 MCG/ACT nasal spray Commonly known as: FLONASE Place 1 spray into both nostrils as needed.   Garlic Oil 4332 MG Caps Take 1 capsule by mouth daily.   Lubricating Plus Eye Drops 0.5 % Soln Generic drug: Carboxymethylcellulose Sod PF Apply to eye.   meclizine 25 MG  tablet Commonly known as: ANTIVERT Take 1 tablet (25 mg total) by mouth as needed for dizziness.   MENS MULTIVITAMIN PLUS PO Take by mouth daily.   simvastatin 20 MG tablet Commonly known as: ZOCOR Take 1 tablet by mouth at bedtime.   Spiriva HandiHaler 18 MCG inhalation capsule Generic drug: tiotropium Place 18 mcg into inhaler and inhale every morning.   triamcinolone cream 0.1 % Commonly known as: KENALOG Apply 1 application topically 2 (two) times daily.       Allergies:  Allergies  Allergen Reactions  .  Accupril [Quinapril Hcl] Anaphylaxis  . Quinapril Swelling    Family History: Family History  Problem Relation Age of Onset  . Hypertension Mother   . Stroke Mother   . Diabetes Sister   . Hypertension Sister   . Diabetes Brother   . Diabetes Brother     Social History:  reports that he quit smoking about 28 years ago. His smoking use included cigarettes. He has a 9.00 pack-year smoking history. He has never used smokeless tobacco. He reports current alcohol use of about 1.0 - 3.0 standard drink of alcohol per week. He reports that he does not use drugs.   Physical Exam: BP (!) 143/70   Pulse (!) 105   Ht 5\' 8"  (1.727 m)   Wt 190 lb (86.2 kg)   BMI 28.89 kg/m   Constitutional:  Alert and oriented, No acute distress. HEENT: [place] AT, moist mucus membranes.  Trachea midline, no masses. Cardiovascular: No clubbing, cyanosis, or edema. Respiratory: Normal respiratory effort, no increased work of breathing. Skin: No rashes, bruises or suspicious lesions. Neurologic: Grossly intact, no focal deficits, moving all 4 extremities. Psychiatric: Normal mood and affect.    Assessment & Plan:    1.  Prostate cancer  Status post radical prostatectomy  Slowly rising PSA  Recheck PSA today  Continue annual follow-up  2.  Left hydrocele/spermatocele  Asymptomatic   Abbie Sons, MD  [facility] [address] [place], [place] [address] [phone]

## 2020-01-11 LAB — PSA: Prostate Specific Ag, Serum: 1.1 ng/mL (ref 0.0–4.0)

## 2020-01-12 ENCOUNTER — Ambulatory Visit: Payer: Medicare Other | Admitting: Urology

## 2020-01-14 NOTE — Progress Notes (Signed)
PSA stable 1.1

## 2020-01-15 ENCOUNTER — Telehealth: Payer: Self-pay | Admitting: *Deleted

## 2020-01-15 ENCOUNTER — Telehealth: Payer: Self-pay

## 2020-01-15 NOTE — Telephone Encounter (Signed)
Got a my chart message for [employer]. I will send this message to him in my chart.

## 2020-01-15 NOTE — Telephone Encounter (Signed)
-----   Message from Abbie Sons, MD sent at 01/14/2020 12:22 PM EST ----- PSA stable 1.1

## 2020-01-15 NOTE — Telephone Encounter (Signed)
Ok to do so

## 2020-01-15 NOTE — Telephone Encounter (Signed)
Copied from [place] [phone]. Topic: General - Other >> Jan 15, 2020  9:43 AM Rainey Pines A wrote: [facility] is requesting clearance from pcp on patient being able to take prednisone while taking aspirin. Best contact is [phone] ext [phone]. Please advise

## 2020-01-15 NOTE — Telephone Encounter (Signed)
Ebony with [facility] was advised.

## 2020-01-23 ENCOUNTER — Other Ambulatory Visit: Payer: Self-pay

## 2020-01-23 ENCOUNTER — Ambulatory Visit
Admission: RE | Admit: 2020-01-23 | Discharge: 2020-01-23 | Disposition: A | Discharge status: Home / Self Care | Payer: Medicare Other | Source: Ambulatory Visit | Attending: Physician Assistant | Admitting: Physician Assistant

## 2020-01-23 DIAGNOSIS — H9042 Sensorineural hearing loss, unilateral, left ear, with unrestricted hearing on the contralateral side: Secondary | ICD-10-CM | POA: Insufficient documentation

## 2020-01-23 DIAGNOSIS — I771 Stricture of artery: Secondary | ICD-10-CM | POA: Diagnosis not present

## 2020-01-23 DIAGNOSIS — H9192 Unspecified hearing loss, left ear: Secondary | ICD-10-CM | POA: Diagnosis not present

## 2020-01-23 DIAGNOSIS — I6389 Other cerebral infarction: Secondary | ICD-10-CM | POA: Diagnosis not present

## 2020-01-23 DIAGNOSIS — R9082 White matter disease, unspecified: Secondary | ICD-10-CM | POA: Diagnosis not present

## 2020-01-23 MED ORDER — GADOBUTROL 1 MMOL/ML IV SOLN
8.0000 mL | Freq: Once | INTRAVENOUS | Status: AC | PRN
  Administered 2020-01-23: 8 mL via INTRAVENOUS

## 2020-02-06 ENCOUNTER — Other Ambulatory Visit: Payer: Self-pay

## 2020-02-06 ENCOUNTER — Ambulatory Visit (INDEPENDENT_AMBULATORY_CARE_PROVIDER_SITE_OTHER): Payer: Medicare Other | Admitting: Family Medicine

## 2020-02-06 ENCOUNTER — Encounter: Payer: Self-pay | Admitting: Family Medicine

## 2020-02-06 VITALS — BP 142/63 | HR 84 | Temp 98.5°F | Resp 18 | Ht 68.0 in | Wt 192.0 lb

## 2020-02-06 DIAGNOSIS — H903 Sensorineural hearing loss, bilateral: Secondary | ICD-10-CM | POA: Diagnosis not present

## 2020-02-06 DIAGNOSIS — I672 Cerebral atherosclerosis: Secondary | ICD-10-CM | POA: Diagnosis not present

## 2020-02-06 DIAGNOSIS — C61 Malignant neoplasm of prostate: Secondary | ICD-10-CM

## 2020-02-06 DIAGNOSIS — R42 Dizziness and giddiness: Secondary | ICD-10-CM

## 2020-02-06 DIAGNOSIS — I6350 Cerebral infarction due to unspecified occlusion or stenosis of unspecified cerebral artery: Secondary | ICD-10-CM | POA: Diagnosis not present

## 2020-02-06 DIAGNOSIS — E1142 Type 2 diabetes mellitus with diabetic polyneuropathy: Secondary | ICD-10-CM | POA: Diagnosis not present

## 2020-02-06 DIAGNOSIS — I10 Essential (primary) hypertension: Secondary | ICD-10-CM

## 2020-02-06 DIAGNOSIS — M47817 Spondylosis without myelopathy or radiculopathy, lumbosacral region: Secondary | ICD-10-CM

## 2020-02-06 DIAGNOSIS — M5030 Other cervical disc degeneration, unspecified cervical region: Secondary | ICD-10-CM | POA: Diagnosis not present

## 2020-02-06 DIAGNOSIS — L728 Other follicular cysts of the skin and subcutaneous tissue: Secondary | ICD-10-CM | POA: Diagnosis not present

## 2020-02-06 DIAGNOSIS — H90A22 Sensorineural hearing loss, unilateral, left ear, with restricted hearing on the contralateral side: Secondary | ICD-10-CM | POA: Diagnosis not present

## 2020-02-06 NOTE — Progress Notes (Signed)
I,[name],acting as a scribe for [name], MD.,have documented all relevant documentation on the behalf of [name], MD,as directed by  [name], MD while in the presence of [name], MD.   Established patient visit   Patient: Michael Murphy   DOB: [birth year]   [name]  MRN: [id number] Visit Date: 02/06/2020  Today's healthcare provider: [name], MD   No chief complaint on file.  Subjective    HPI  Patient's daughter is with him and she recently moved here to from [place] to help care for him.  Patient comes in today for follow-up of chronic dizziness and and recent problems with worsening hearing loss.  The hearing loss is worse on the left than the right.  The ENT is recommended there are an injection into the ear for the hearing loss.  Also recommended further testing. Did have recent MRI which showed an old small cerebellar infarct.  Diabetes Mellitus Type II, follow-up  Lab Results  Component Value Date   HGBA1C 6.7 (A) 10/18/2019   HGBA1C 6.7 (H) 07/18/2019   HGBA1C 6.8 (A) 01/17/2019   Last seen for diabetes 3 months ago.  Management since then includes; Good control.  No changes in medications.  I will see him back in 4 months. He reports good compliance with treatment. He is not having side effects. none  Home blood sugar records: fasting range: 130  Episodes of hypoglycemia? No none   Current insulin regiment: n/a Most Recent Eye Exam: due  -------------------------------------------------------------------- Hypertension, follow-up  BP Readings from Last 3 Encounters:  01/10/20 (!) 143/70  01/01/20 (!) 156/83  12/06/19 115/68   Wt Readings from Last 3 Encounters:  01/10/20 190 lb (86.2 kg)  01/01/20 191 lb 8 oz (86.9 kg)  12/06/19 199 lb (90.3 kg)     He was last seen for hypertension 2 months ago.  BP at that visit was 115/68. Management since that visit includes; Good control.  Blood  pressures are in a reasonable range for his age. He reports good compliance with treatment. He is not having side effects. none He is not exercising. He is adherent to low salt diet.   Outside blood pressures are not checking.  He does not smoke.  Use of agents associated with hypertension: none.   --------------------------------------------------------------------  Lightheadedness From 12/06/2019-I think this is orthostasis with standing for the most part.  Arteriosclerosis certainly contributes in his soon-to-be 86 year old.. Discussed making sure when he stands he holds onto something for a few seconds before walking. Referred to cardiology for consideration of work-up such as echo or loop monitor.  I do not think the patient is describing ischemic disease at all.  Dizziness From 10/05/2019-Referred to ENT.  Use meclizine as needed. US Carotid Duplex Bilateral obtained showing-Carotids showed no significant blockage.      Medications: Outpatient Medications Prior to Visit  Medication Sig  . albuterol (PROVENTIL HFA;VENTOLIN HFA) 108 (90 Base) MCG/ACT inhaler Inhale 2 puffs into the lungs every 6 (six) hours as needed for wheezing or shortness of breath.  Marland Kitchen amLODipine (NORVASC) 10 MG tablet Take 0.5 tablets by mouth daily.   Marland Kitchen aspirin 81 MG chewable tablet Chew by mouth daily.  . calcium carbonate (CALCIUM 600) 600 MG TABS tablet Take 600 mg by mouth daily.  Marland Kitchen FLAXSEED, LINSEED, PO Take 2 capsules by mouth daily.  . fluticasone (FLONASE) 50 MCG/ACT nasal spray Place 1 spray into both nostrils as needed.   Marland Kitchen  Garlic Oil 8413 MG CAPS Take 1 capsule by mouth daily.  . LUBRICATING PLUS EYE DROPS 0.5 % SOLN Apply to eye.  . meclizine (ANTIVERT) 25 MG tablet Take 1 tablet (25 mg total) by mouth as needed for dizziness.  . Multiple Vitamins-Minerals (MENS MULTIVITAMIN PLUS PO) Take by mouth daily.  . Omega-3 Fatty Acids (FISH OIL) 1000 MG CAPS Take 1 capsule by mouth daily.  .  simvastatin (ZOCOR) 20 MG tablet Take 1 tablet by mouth at bedtime.  Marland Kitchen tiotropium (SPIRIVA HANDIHALER) 18 MCG inhalation capsule Place 18 mcg into inhaler and inhale every morning.  . triamcinolone cream (KENALOG) 0.1 % Apply 1 application topically 2 (two) times daily.   No facility-administered medications prior to visit.    Review of Systems  Constitutional: Negative for appetite change, chills and fever.  Respiratory: Negative for chest tightness, shortness of breath and wheezing.   Cardiovascular: Negative for chest pain and palpitations.  Gastrointestinal: Negative for abdominal pain, nausea and vomiting.       Objective    There were no vitals taken for this visit. BP Readings from Last 3 Encounters:  02/06/20 (!) 142/63  01/10/20 (!) 143/70  01/01/20 (!) 156/83   Wt Readings from Last 3 Encounters:  02/06/20 192 lb (87.1 kg)  01/10/20 190 lb (86.2 kg)  01/01/20 191 lb 8 oz (86.9 kg)      Physical Exam Vitals reviewed.  Constitutional:      Appearance: He is well-developed.     Comments: Patient appears younger than his age of 74  HENT:     Head: Normocephalic and atraumatic.     Right Ear: External ear normal.     Left Ear: External ear normal.     Nose: Nose normal.  Eyes:     General: No scleral icterus.    Conjunctiva/sclera: Conjunctivae normal.  Neck:     Thyroid: No thyromegaly.  Cardiovascular:     Rate and Rhythm: Normal rate and regular rhythm.     Heart sounds: Normal heart sounds.  Pulmonary:     Effort: Pulmonary effort is normal.     Breath sounds: Normal breath sounds.  Abdominal:     Palpations: Abdomen is soft.  Musculoskeletal:     Right lower leg: Edema present.     Left lower leg: Edema present.     Comments: 1+ bilateral lower extremity edema  Skin:    General: Skin is warm and dry.  Neurological:     General: No focal deficit present.     Mental Status: He is alert and oriented to person, place, and time.     Sensory: No  sensory deficit.     Motor: No weakness.     Coordination: Coordination normal.     Comments: Gait is normal for age today.  No nystagmus noted today.  Psychiatric:        Mood and Affect: Mood normal.        Behavior: Behavior normal.        Thought Content: Thought content normal.        Judgment: Judgment normal.       No results found for any visits on 02/06/20.  Assessment & Plan     1. Type 2 diabetes mellitus with diabetic polyneuropathy, without long-term current use of insulin (HCC) Goal A1c less than 8.5 - Hemoglobin A1c  2. Benign essential HTN Good control.  3. Dizziness Ongoing issue which is improving.  MRI shows old cerebellar infarct. ENT  evaluation ongoing.  Refer to neurology for their opinion. - Ambulatory referral to Neurology  4. Sensorineural hearing loss (SNHL) of left ear with restricted hearing of right ear Per ENT.  Had a long talk with patient regarding this issue and treatment with steroid injection.  5. Spondylosis of lumbosacral region without myelopathy or radiculopathy   6.h/o Prostate cancer ([place])    No follow-ups on file.      I, [name], MD, have reviewed all documentation for this visit. The documentation on 02/10/20 for the exam, diagnosis, procedures, and orders are all accurate and complete.    Pressley Barsky Cranford Mon, MD  [facility] [phone] (phone) [phone] (fax)  [place]

## 2020-02-07 ENCOUNTER — Telehealth: Payer: Self-pay

## 2020-02-07 LAB — HEMOGLOBIN A1C
Est. average glucose Bld gHb Est-mCnc: 140 mg/dL
Hgb A1c MFr Bld: 6.5 % — ABNORMAL HIGH (ref 4.8–5.6)

## 2020-02-07 NOTE — Telephone Encounter (Signed)
Written by Jerrol Banana., MD on 02/07/2020 1:05 PM EST Seen by patient Michael Murphy on 02/07/2020 1:31 PM

## 2020-02-07 NOTE — Telephone Encounter (Signed)
-----   Message from Jerrol Banana., MD sent at 02/07/2020  1:05 PM EST ----- Diabetic control very good

## 2020-02-13 DIAGNOSIS — R42 Dizziness and giddiness: Secondary | ICD-10-CM | POA: Diagnosis not present

## 2020-02-20 ENCOUNTER — Ambulatory Visit: Payer: Self-pay | Admitting: Family Medicine

## 2020-02-26 DIAGNOSIS — H90A22 Sensorineural hearing loss, unilateral, left ear, with restricted hearing on the contralateral side: Secondary | ICD-10-CM | POA: Diagnosis not present

## 2020-02-26 DIAGNOSIS — R42 Dizziness and giddiness: Secondary | ICD-10-CM | POA: Diagnosis not present

## 2020-03-11 ENCOUNTER — Ambulatory Visit (INDEPENDENT_AMBULATORY_CARE_PROVIDER_SITE_OTHER): Payer: Medicare Other | Admitting: Family Medicine

## 2020-03-11 ENCOUNTER — Other Ambulatory Visit: Payer: Self-pay

## 2020-03-11 ENCOUNTER — Encounter: Payer: Self-pay | Admitting: Family Medicine

## 2020-03-11 VITALS — BP 111/71 | HR 97 | Temp 97.9°F | Wt 198.0 lb

## 2020-03-11 DIAGNOSIS — M25561 Pain in right knee: Secondary | ICD-10-CM

## 2020-03-11 DIAGNOSIS — R42 Dizziness and giddiness: Secondary | ICD-10-CM

## 2020-03-11 DIAGNOSIS — G8929 Other chronic pain: Secondary | ICD-10-CM

## 2020-03-11 DIAGNOSIS — H90A22 Sensorineural hearing loss, unilateral, left ear, with restricted hearing on the contralateral side: Secondary | ICD-10-CM

## 2020-03-11 DIAGNOSIS — L309 Dermatitis, unspecified: Secondary | ICD-10-CM | POA: Diagnosis not present

## 2020-03-11 DIAGNOSIS — M7121 Synovial cyst of popliteal space [Baker], right knee: Secondary | ICD-10-CM

## 2020-03-11 DIAGNOSIS — E1142 Type 2 diabetes mellitus with diabetic polyneuropathy: Secondary | ICD-10-CM | POA: Diagnosis not present

## 2020-03-11 MED ORDER — MOMETASONE FUROATE 0.1 % EX CREA: 1.0000 "application " | TOPICAL_CREAM | Freq: Every day | CUTANEOUS | 0 refills | Status: DC

## 2020-03-11 NOTE — Progress Notes (Signed)
Established patient visit   Patient: Michael Murphy   DOB: 10/27/1927   87 y.o. Male  MRN: [id number] Visit Date: 03/11/2020  Today's healthcare provider: [name], MD   Chief Complaint  Patient presents with  . Knee Pain  . Rash   Subjective    Knee Pain  There was no injury mechanism. The pain is present in the right knee. The pain has been worsening since onset. Pertinent negatives include no inability to bear weight, loss of motion, loss of sensation, muscle weakness, numbness or tingling. He reports no foreign bodies present. The symptoms are aggravated by movement. He has tried nothing for the symptoms.  Rash This is a chronic problem. The affected locations include the right foot, right ankle and left foot. The rash is characterized by itchiness. He was exposed to nothing. Past treatments include topical steroids. The treatment provided mild relief.    He does have some right knee pain with weightbearing.  He is seen orthopedics in the past. The rash on his leg does not really bother him but it is getting worse over the past month or so.  It is mainly on the right lower leg and right ankle and foot. His dizziness seems to be better lately.  He has an appointment for injection of his ear for the dizziness. He also has neurology appointment next month for old CVA on imaging. He walks with a walker but gets around fairly well with that.  No recent falls. The last issue is tingling in his fingertips when he first wakes up.  It is in all 5 fingers.  It goes away after he has been up a little bit.  If he warms them up with warm water he goes away.  Patient Active Problem List   Diagnosis Date Noted  . COPD exacerbation ([place]) 11/27/2019  . Erectile dysfunction after radical prostatectomy 01/11/2019  . Encysted hydrocele 01/11/2019  . Scrotal pain 05/02/2018  . Right hydrocele 05/02/2018  . Spermatocele 05/02/2018  . Type 2 diabetes mellitus without complication,  without long-term current use of insulin ([place]) 05/04/2017  . Abnormal laboratory test 07/12/2014  . Carpal tunnel syndrome 07/12/2014  . Adiposity 07/12/2014  . Neurosis, posttraumatic 07/12/2014  . CA of prostate ([place]) 07/12/2014  . Excessive urination at night 01/23/2013  . ED (erectile dysfunction) of organic origin 01/23/2013  . Malignant neoplasm of prostate ([place]) 12/07/2011  . Prostate cancer ([place]) 12/07/2011  . Diabetic neuropathy ([place]) 02/12/2009  . Allergic rhinitis 02/11/2007  . Benign essential HTN 02/11/2007  . Arthritis, degenerative 02/11/2007  . OP (osteoporosis) 02/11/2007  . Hypercholesterolemia without hypertriglyceridemia 02/11/2007   Past Medical History:  Diagnosis Date  . Adiposity 07/12/2014  . Allergic rhinitis 02/11/2007  . Arthritis, degenerative 02/11/2007  . Benign essential HTN 02/11/2007   Goal BP< 130/80 due DMII.   Marland Kitchen CA of prostate ([place]) 07/12/2014  . Carpal tunnel syndrome 07/12/2014  . Diabetes mellitus without complication ([place])   . Diabetic neuropathy ([place]) 02/12/2009   s/p Lifestyle Education Diabetic Classes 2012.  Monofilament intact 09/18/2013-normal bilaterally.   . ED (erectile dysfunction) of organic origin 01/23/2013  . Epididymitis 07/12/2014  . Excessive urination at night 01/23/2013  . Hypercholesterolemia without hypertriglyceridemia 02/11/2007  . Hypertension   . Neurosis, posttraumatic 07/12/2014   ([place] [facility]) and ([place]).   . OP (osteoporosis) 02/11/2007   Social History   Tobacco Use  . Smoking status: Former Smoker    Packs/day: 0.50  Years: 18.00    Pack years: 9.00    Types: Cigarettes    Quit date: 03/09/1991    Years since quitting: 29.0  . Smokeless tobacco: Never Used  Vaping Use  . Vaping Use: Never used  Substance Use Topics  . Alcohol use: Yes    Alcohol/week: 1.0 - 3.0 standard drink    Types: 1 - 3 Cans of beer per week  . Drug use: No   Allergies  Allergen Reactions  . Accupril [Quinapril Hcl]  Anaphylaxis  . Quinapril Swelling     Medications: Outpatient Medications Prior to Visit  Medication Sig  . albuterol (PROVENTIL HFA;VENTOLIN HFA) 108 (90 Base) MCG/ACT inhaler Inhale 2 puffs into the lungs every 6 (six) hours as needed for wheezing or shortness of breath.  Marland Kitchen amLODipine (NORVASC) 10 MG tablet Take 0.5 tablets by mouth daily.   Marland Kitchen aspirin 81 MG chewable tablet Chew by mouth daily.  . calcium carbonate (OS-CAL) 600 MG TABS tablet Take 600 mg by mouth daily.  Marland Kitchen FLAXSEED, LINSEED, PO Take 2 capsules by mouth daily.  . fluticasone (FLONASE) 50 MCG/ACT nasal spray Place 1 spray into both nostrils as needed.   . Garlic Oil 1000 MG CAPS Take 1 capsule by mouth daily.  . LUBRICATING PLUS EYE DROPS 0.5 % SOLN Apply to eye.  . meclizine (ANTIVERT) 25 MG tablet Take 1 tablet (25 mg total) by mouth as needed for dizziness.  . Multiple Vitamins-Minerals (MENS MULTIVITAMIN PLUS PO) Take by mouth daily.  . Omega-3 Fatty Acids (FISH OIL) 1000 MG CAPS Take 1 capsule by mouth daily.  . simvastatin (ZOCOR) 20 MG tablet Take 1 tablet by mouth at bedtime.  Marland Kitchen tiotropium (SPIRIVA) 18 MCG inhalation capsule Place 18 mcg into inhaler and inhale every morning.  . triamcinolone cream (KENALOG) 0.1 % Apply 1 application topically 2 (two) times daily.   No facility-administered medications prior to visit.    Review of Systems  Constitutional: Negative.   Respiratory: Negative.   Cardiovascular: Negative.   Gastrointestinal: Negative.   Musculoskeletal: Positive for arthralgias and joint swelling. Negative for back pain, gait problem, myalgias, neck pain and neck stiffness.  Skin: Positive for rash.  Neurological: Positive for dizziness. Negative for tingling, light-headedness, numbness and headaches.      Objective    BP 111/71 (BP Location: Right Arm, Patient Position: Sitting, Cuff Size: Large)   Pulse 97   Temp 97.9 F (36.6 C) (Oral)   Wt 198 lb (89.8 kg)   BMI 30.11 kg/m     Physical Exam Vitals reviewed.  Constitutional:      Appearance: He is well-developed.     Comments: He is alert and appears younger than his age of 71.  HENT:     Head: Normocephalic and atraumatic.     Right Ear: External ear normal.     Left Ear: External ear normal.     Nose: Nose normal.  Eyes:     General: No scleral icterus.    Conjunctiva/sclera: Conjunctivae normal.  Neck:     Thyroid: No thyromegaly.  Cardiovascular:     Rate and Rhythm: Normal rate and regular rhythm.     Heart sounds: Normal heart sounds.  Pulmonary:     Effort: Pulmonary effort is normal.     Breath sounds: Normal breath sounds.  Abdominal:     Palpations: Abdomen is soft.  Musculoskeletal:     Comments: Minimal right knee effusion with some mild joint line tenderness.  He  does have a tense Baker's cyst behind the right knee.  Skin:    General: Skin is warm and dry.  Neurological:     General: No focal deficit present.     Mental Status: He is alert and oriented to person, place, and time.     Sensory: No sensory deficit.     Motor: No weakness.     Coordination: Coordination normal.     Comments: Tinel's sign negative bilaterally  Psychiatric:        Mood and Affect: Mood normal.        Behavior: Behavior normal.        Thought Content: Thought content normal.        Judgment: Judgment normal.       No results found for any visits on 03/11/20.  Assessment & Plan     1. Dermatitis Treated with mometasone cream.  This appears to be eczematous or nummular eczema.   2. Type 2 diabetes mellitus with diabetic polyneuropathy, without long-term current use of insulin (HCC) A1c on next visit and will obtain other blood work also.   3. Dizziness Has ENT and neurology follow-up. For injection by ENT to try to help dizziness and hearing loss.  4. Sensorineural hearing loss (SNHL) of left ear with restricted hearing of right ear   5. Chronic pain of right knee Refer back to  orthopedics.  6. Baker's cyst of knee, right Seems to have a calcified Baker's cyst of the right knee.  Again, Ortho referral pending. We will try turmeric in the meantime.  Anything topical is fine.   No follow-ups on file.         Richard Cranford Mon, MD  [facility] [phone] (phone) [phone] (fax)  [place]

## 2020-03-12 DIAGNOSIS — L309 Dermatitis, unspecified: Secondary | ICD-10-CM | POA: Insufficient documentation

## 2020-03-13 DIAGNOSIS — H9122 Sudden idiopathic hearing loss, left ear: Secondary | ICD-10-CM | POA: Diagnosis not present

## 2020-03-13 DIAGNOSIS — H903 Sensorineural hearing loss, bilateral: Secondary | ICD-10-CM | POA: Diagnosis not present

## 2020-03-21 ENCOUNTER — Telehealth: Payer: Self-pay

## 2020-03-21 DIAGNOSIS — L309 Dermatitis, unspecified: Secondary | ICD-10-CM

## 2020-03-21 DIAGNOSIS — M7121 Synovial cyst of popliteal space [Baker], right knee: Secondary | ICD-10-CM

## 2020-03-21 NOTE — Telephone Encounter (Signed)
We can do a local referral to dermatology but I am not sure why he would be in a lot of pain so we might need to see him next week or tomorrow if anybody has any openings.  He just had some eczematous/dermatitis changes when I saw him.  So if he is in  pain he might need a visit.

## 2020-03-21 NOTE — Telephone Encounter (Signed)
Copied from [place] [id number]. Topic: Quick Communication - See Telephone Encounter >> Mar 21, 2020 10:07 AM Loma Boston wrote: CRM for notification. See Telephone encounter for: 03/21/20. Pt caregiver, daughter, Linwood Dibbles states at visit on 03/11/20 discussed dermatologist referral but pt was going to go to [place]. [facility] They would like a referral local in order to see if he can get in earlier as in a lot ofpain. FU call to [phone] [place]/ wk #

## 2020-03-26 NOTE — Addendum Note (Signed)
Addended by: Shawna Orleans on: 03/26/2020 04:57 PM   Modules accepted: Orders

## 2020-03-28 ENCOUNTER — Other Ambulatory Visit: Payer: Self-pay

## 2020-03-28 ENCOUNTER — Ambulatory Visit (INDEPENDENT_AMBULATORY_CARE_PROVIDER_SITE_OTHER): Payer: Medicare Other | Admitting: Family Medicine

## 2020-03-28 VITALS — BP 156/84 | HR 102 | Temp 97.7°F | Ht 68.0 in | Wt 199.0 lb

## 2020-03-28 DIAGNOSIS — M7121 Synovial cyst of popliteal space [Baker], right knee: Secondary | ICD-10-CM

## 2020-03-28 DIAGNOSIS — E1142 Type 2 diabetes mellitus with diabetic polyneuropathy: Secondary | ICD-10-CM | POA: Diagnosis not present

## 2020-03-28 DIAGNOSIS — L309 Dermatitis, unspecified: Secondary | ICD-10-CM | POA: Diagnosis not present

## 2020-03-28 DIAGNOSIS — R42 Dizziness and giddiness: Secondary | ICD-10-CM

## 2020-03-28 NOTE — Progress Notes (Signed)
HPI  Rash: Patient complains of rash involving the posterior knee. Rash started 1 year ago. Appearance of rash at onset: Size of lesion(s): quarter size. Rash has changed over time Initial distribution:  Discomfort associated with rash: is painful.  Associated symptoms: none. Denies: none. Patient has not had previous evaluation of rash. Patient has had previous treatment. Patient has not had contacts with similar rash. Patient has not identified precipitant. Patient has not had new exposures (soaps, lotions, laundry detergents, foods, medications, plants, insects or animals.)   Patient is actually not complaining of a rash but complains of a knot behind his right knee.  This was identified as a Baker's cyst on his last visit.  It is just becoming more tender and painful for him.  He would like to see orthopedics. Light headedness and dizzyness are stable and improved.  Patient Active Problem List   Diagnosis Date Noted  . Eczema 03/12/2020  . COPD exacerbation ([place]) 11/27/2019  . Erectile dysfunction after radical prostatectomy 01/11/2019  . Encysted hydrocele 01/11/2019  . Scrotal pain 05/02/2018  . Right hydrocele 05/02/2018  . Spermatocele 05/02/2018  . Type 2 diabetes mellitus without complication, without long-term current use of insulin ([place]) 05/04/2017  . Abnormal laboratory test 07/12/2014  . Carpal tunnel syndrome 07/12/2014  . Adiposity 07/12/2014  . Neurosis, posttraumatic 07/12/2014  . CA of prostate ([place]) 07/12/2014  . Excessive urination at night 01/23/2013  . ED (erectile dysfunction) of organic origin 01/23/2013  . Malignant neoplasm of prostate ([place]) 12/07/2011  . Prostate cancer ([place]) 12/07/2011  . Diabetic neuropathy ([place]) 02/12/2009  . Allergic rhinitis 02/11/2007  . Benign essential HTN 02/11/2007  . Arthritis, degenerative 02/11/2007  . OP (osteoporosis) 02/11/2007  . Hypercholesterolemia without hypertriglyceridemia 02/11/2007   Past Medical History:   Diagnosis Date  . Adiposity 07/12/2014  . Allergic rhinitis 02/11/2007  . Arthritis, degenerative 02/11/2007  . Benign essential HTN 02/11/2007   Goal BP< 130/80 due DMII.   Marland Kitchen CA of prostate ([place]) 07/12/2014  . Carpal tunnel syndrome 07/12/2014  . Diabetes mellitus without complication ([place])   . Diabetic neuropathy ([place]) 02/12/2009   s/p Lifestyle Education Diabetic Classes 2012.  Monofilament intact 09/18/2013-normal bilaterally.   . ED (erectile dysfunction) of organic origin 01/23/2013  . Epididymitis 07/12/2014  . Excessive urination at night 01/23/2013  . Hypercholesterolemia without hypertriglyceridemia 02/11/2007  . Hypertension   . Neurosis, posttraumatic 07/12/2014   ([place] [facility]) and ([place]).   . OP (osteoporosis) 02/11/2007   Family History  Problem Relation Age of Onset  . Hypertension Mother   . Stroke Mother   . Diabetes Sister   . Hypertension Sister   . Diabetes Brother   . Diabetes Brother    Allergies  Allergen Reactions  . Accupril [Quinapril Hcl] Anaphylaxis  . Quinapril Swelling      Medications: Outpatient Medications Prior to Visit  Medication Sig  . albuterol (PROVENTIL HFA;VENTOLIN HFA) 108 (90 Base) MCG/ACT inhaler Inhale 2 puffs into the lungs every 6 (six) hours as needed for wheezing or shortness of breath.  Marland Kitchen amLODipine (NORVASC) 10 MG tablet Take 0.5 tablets by mouth daily.   Marland Kitchen aspirin 81 MG chewable tablet Chew by mouth daily.  . calcium carbonate (OS-CAL) 600 MG TABS tablet Take 600 mg by mouth daily.  Marland Kitchen FLAXSEED, LINSEED, PO Take 2 capsules by mouth daily.  . fluticasone (FLONASE) 50 MCG/ACT nasal spray Place 1 spray into both nostrils as needed.   . Garlic  Oil 1000 MG CAPS Take 1 capsule by mouth daily.  . LUBRICATING PLUS EYE DROPS 0.5 % SOLN Apply to eye.  . meclizine (ANTIVERT) 25 MG tablet Take 1 tablet (25 mg total) by mouth as needed for dizziness.  . mometasone (ELOCON) 0.1 % cream Apply 1 application topically daily. Apply small amount  to affected areas  . Multiple Vitamins-Minerals (MENS MULTIVITAMIN PLUS PO) Take by mouth daily.  . Omega-3 Fatty Acids (FISH OIL) 1000 MG CAPS Take 1 capsule by mouth daily.  . simvastatin (ZOCOR) 20 MG tablet Take 1 tablet by mouth at bedtime.  Marland Kitchen tiotropium (SPIRIVA) 18 MCG inhalation capsule Place 18 mcg into inhaler and inhale every morning.  . triamcinolone cream (KENALOG) 0.1 % Apply 1 application topically 2 (two) times daily.   No facility-administered medications prior to visit.    Review of Systems     Objective    There were no vitals taken for this visit. BP Readings from Last 3 Encounters:  03/28/20 (!) 156/84  03/11/20 111/71  02/06/20 (!) 142/63   Wt Readings from Last 3 Encounters:  03/28/20 199 lb (90.3 kg)  03/11/20 198 lb (89.8 kg)  02/06/20 192 lb (87.1 kg)      Physical Exam Vitals reviewed.  Constitutional:      Appearance: Normal appearance.  Cardiovascular:     Heart sounds: Normal heart sounds.  Pulmonary:     Effort: Pulmonary effort is normal.     Breath sounds: Normal breath sounds.  Musculoskeletal:     Comments: He has a large firm Baker's cyst behind the right knee. He has normal function of his leg below the knee.  Neurological:     Mental Status: He is alert and oriented to person, place, and time.  Psychiatric:        Mood and Affect: Mood normal.        Behavior: Behavior normal.        Thought Content: Thought content normal.        Judgment: Judgment normal.       Assessment & Plan     1. Baker's cyst of knee, right Patient has orthopedic appointment in the next 24 hours.  2. Dermatitis Improving  3. Dizziness Clinically stable and patient is decided to get the injections and is here.  4. Type 2 diabetes mellitus with diabetic polyneuropathy, without long-term current use of insulin ([place]) Follow-up on next visit.   No follow-ups on file.     I discussed the assessment and treatment plan with the patient. The  patient was provided an opportunity to ask questions and all were answered. The patient agreed with the plan and demonstrated an understanding of the instructions.   The patient was advised to call back or seek an in-person evaluation if the symptoms worsen or if the condition fails to improve as anticipated.    Farren Landa Cranford Mon, MD [facility] [phone] (phone) [phone] (fax)  [place]

## 2020-03-29 DIAGNOSIS — M1711 Unilateral primary osteoarthritis, right knee: Secondary | ICD-10-CM | POA: Diagnosis not present

## 2020-04-01 DIAGNOSIS — Z8673 Personal history of transient ischemic attack (TIA), and cerebral infarction without residual deficits: Secondary | ICD-10-CM | POA: Diagnosis not present

## 2020-04-01 DIAGNOSIS — R270 Ataxia, unspecified: Secondary | ICD-10-CM | POA: Diagnosis not present

## 2020-04-01 DIAGNOSIS — R42 Dizziness and giddiness: Secondary | ICD-10-CM | POA: Diagnosis not present

## 2020-04-01 DIAGNOSIS — R262 Difficulty in walking, not elsewhere classified: Secondary | ICD-10-CM | POA: Diagnosis not present

## 2020-04-05 DIAGNOSIS — M1711 Unilateral primary osteoarthritis, right knee: Secondary | ICD-10-CM | POA: Diagnosis not present

## 2020-04-09 DIAGNOSIS — H9122 Sudden idiopathic hearing loss, left ear: Secondary | ICD-10-CM | POA: Diagnosis not present

## 2020-04-15 DIAGNOSIS — M1711 Unilateral primary osteoarthritis, right knee: Secondary | ICD-10-CM | POA: Diagnosis not present

## 2020-04-16 DIAGNOSIS — H903 Sensorineural hearing loss, bilateral: Secondary | ICD-10-CM | POA: Diagnosis not present

## 2020-04-30 DIAGNOSIS — Z7951 Long term (current) use of inhaled steroids: Secondary | ICD-10-CM | POA: Diagnosis not present

## 2020-04-30 DIAGNOSIS — Z7984 Long term (current) use of oral hypoglycemic drugs: Secondary | ICD-10-CM | POA: Diagnosis not present

## 2020-04-30 DIAGNOSIS — J449 Chronic obstructive pulmonary disease, unspecified: Secondary | ICD-10-CM | POA: Diagnosis not present

## 2020-04-30 DIAGNOSIS — I051 Rheumatic mitral insufficiency: Secondary | ICD-10-CM | POA: Diagnosis not present

## 2020-04-30 DIAGNOSIS — F17210 Nicotine dependence, cigarettes, uncomplicated: Secondary | ICD-10-CM | POA: Diagnosis not present

## 2020-04-30 DIAGNOSIS — Z8546 Personal history of malignant neoplasm of prostate: Secondary | ICD-10-CM | POA: Diagnosis not present

## 2020-04-30 DIAGNOSIS — E119 Type 2 diabetes mellitus without complications: Secondary | ICD-10-CM | POA: Diagnosis not present

## 2020-04-30 DIAGNOSIS — I10 Essential (primary) hypertension: Secondary | ICD-10-CM | POA: Diagnosis not present

## 2020-04-30 DIAGNOSIS — Z1383 Encounter for screening for respiratory disorder NEC: Secondary | ICD-10-CM | POA: Diagnosis not present

## 2020-04-30 DIAGNOSIS — R0600 Dyspnea, unspecified: Secondary | ICD-10-CM | POA: Diagnosis not present

## 2020-04-30 DIAGNOSIS — Z114 Encounter for screening for human immunodeficiency virus [HIV]: Secondary | ICD-10-CM | POA: Diagnosis not present

## 2020-04-30 DIAGNOSIS — R0602 Shortness of breath: Secondary | ICD-10-CM | POA: Diagnosis not present

## 2020-04-30 DIAGNOSIS — Z79899 Other long term (current) drug therapy: Secondary | ICD-10-CM | POA: Diagnosis not present

## 2020-04-30 DIAGNOSIS — Z7289 Other problems related to lifestyle: Secondary | ICD-10-CM | POA: Diagnosis not present

## 2020-04-30 DIAGNOSIS — Z7982 Long term (current) use of aspirin: Secondary | ICD-10-CM | POA: Diagnosis not present

## 2020-04-30 DIAGNOSIS — R918 Other nonspecific abnormal finding of lung field: Secondary | ICD-10-CM | POA: Diagnosis not present

## 2020-04-30 DIAGNOSIS — Z7983 Long term (current) use of bisphosphonates: Secondary | ICD-10-CM | POA: Diagnosis not present

## 2020-05-17 DIAGNOSIS — M1711 Unilateral primary osteoarthritis, right knee: Secondary | ICD-10-CM | POA: Diagnosis not present

## 2020-06-03 NOTE — Progress Notes (Signed)
Established patient visit   Patient: Michael Murphy   DOB: [birth year]   87 y.o. Male  MRN: [id number] Visit Date: 06/04/2020  Today's healthcare provider: [name], MD   Chief Complaint  Patient presents with  . Diabetes  . Hypertension   Subjective    HPI  Patient feels well.  He has no complaints today.  His daughter is now taking care of him. Diabetes Mellitus Type II, Follow-up  Lab Results  Component Value Date   HGBA1C 6.5 (A) 06/04/2020   HGBA1C 6.5 (H) 02/06/2020   HGBA1C 6.7 (A) 10/18/2019   Wt Readings from Last 3 Encounters:  06/04/20 193 lb (87.5 kg)  03/28/20 199 lb (90.3 kg)  03/11/20 198 lb (89.8 kg)   Last seen for diabetes 4 months ago.  Management since then includes no changes. Good control  He reports good compliance with treatment. He is not having side effects.  Symptoms: No fatigue No foot ulcerations  No appetite changes No nausea  No paresthesia of the feet  No polydipsia  No polyuria No visual disturbances   No vomiting     Home blood sugar records: fasting range: 130s  Episodes of hypoglycemia? No    Current insulin regiment: none Most Recent Eye Exam: 05/17/2018 Current exercise: no regular exercise Current diet habits: well balanced  Pertinent Labs: Lab Results  Component Value Date   CHOL 166 07/18/2019   HDL 73 07/18/2019   LDLCALC 83 07/18/2019   TRIG 45 07/18/2019   CHOLHDL 2.3 07/18/2019   Lab Results  Component Value Date   NA 140 11/27/2019   K 3.8 11/27/2019   CREATININE 0.76 11/27/2019   GFRNONAA >60 11/27/2019   GFRAA >60 11/27/2019   GLUCOSE 111 (H) 11/27/2019     Hypertension, follow-up  BP Readings from Last 3 Encounters:  06/04/20 (!) 147/68  03/28/20 (!) 156/84  03/11/20 111/71   Wt Readings from Last 3 Encounters:  06/04/20 193 lb (87.5 kg)  03/28/20 199 lb (90.3 kg)  03/11/20 198 lb (89.8 kg)     He was last seen for hypertension 4 months ago.  BP at that visit was 142/63.   Management since that visit includes no changes. Continue to monitor at home.  He reports good compliance with treatment. He is not having side effects.  He is following a Regular diet. He is not exercising. He does not smoke.  Use of agents associated with hypertension: none.   Outside blood pressures are not being checked. Symptoms: No chest pain No chest pressure  No palpitations No syncope  No dyspnea No orthopnea  No paroxysmal nocturnal dyspnea Yes lower extremity edema   Pertinent labs: Lab Results  Component Value Date   CHOL 166 07/18/2019   HDL 73 07/18/2019   LDLCALC 83 07/18/2019   TRIG 45 07/18/2019   CHOLHDL 2.3 07/18/2019   Lab Results  Component Value Date   NA 140 11/27/2019   K 3.8 11/27/2019   CREATININE 0.76 11/27/2019   GFRNONAA >60 11/27/2019   GFRAA >60 11/27/2019   GLUCOSE 111 (H) 11/27/2019     The ASCVD Risk score (Goff DC Jr., et al., 2013) failed to calculate for the following reasons:   The 2013 ASCVD risk score is only valid for ages 41 to 35        Medications: Outpatient Medications Prior to Visit  Medication Sig  . amLODipine (NORVASC) 10 MG tablet Take 0.5 tablets by mouth daily.   Marland Kitchen  aspirin 81 MG chewable tablet Chew by mouth daily.  Marland Kitchen albuterol (PROVENTIL HFA;VENTOLIN HFA) 108 (90 Base) MCG/ACT inhaler Inhale 2 puffs into the lungs every 6 (six) hours as needed for wheezing or shortness of breath.  . calcium carbonate (OS-CAL) 600 MG TABS tablet Take 600 mg by mouth daily.  Marland Kitchen FLAXSEED, LINSEED, PO Take 2 capsules by mouth daily.  . fluticasone (FLONASE) 50 MCG/ACT nasal spray Place 1 spray into both nostrils as needed.   . Garlic Oil 7673 MG CAPS Take 1 capsule by mouth daily.  . LUBRICATING PLUS EYE DROPS 0.5 % SOLN Apply to eye.  . meclizine (ANTIVERT) 25 MG tablet Take 1 tablet (25 mg total) by mouth as needed for dizziness. (Patient not taking: Reported on 03/28/2020)  . mometasone (ELOCON) 0.1 % cream Apply 1 application  topically daily. Apply small amount to affected areas  . Multiple Vitamins-Minerals (MENS MULTIVITAMIN PLUS PO) Take by mouth daily.  . Omega-3 Fatty Acids (FISH OIL) 1000 MG CAPS Take 1 capsule by mouth daily.  . simvastatin (ZOCOR) 20 MG tablet Take 1 tablet by mouth at bedtime.  Marland Kitchen tiotropium (SPIRIVA) 18 MCG inhalation capsule Place 18 mcg into inhaler and inhale every morning.  . triamcinolone cream (KENALOG) 0.1 % Apply 1 application topically 2 (two) times daily. (Patient not taking: Reported on 03/28/2020)   No facility-administered medications prior to visit.    Review of Systems  Constitutional: Negative for activity change and fatigue.  Respiratory: Negative for cough and shortness of breath.   Cardiovascular: Negative for chest pain, palpitations and leg swelling.  Endocrine: Negative for cold intolerance, heat intolerance, polydipsia, polyphagia and polyuria.  Neurological: Negative for dizziness and light-headedness.        Objective    BP (!) 147/68   Pulse 79   Temp 98 F (36.7 C)   Resp 16   Ht 5\' 8"  (1.727 m)   Wt 193 lb (87.5 kg)   BMI 29.35 kg/m  BP Readings from Last 3 Encounters:  06/04/20 (!) 147/68  03/28/20 (!) 156/84  03/11/20 111/71   Wt Readings from Last 3 Encounters:  06/04/20 193 lb (87.5 kg)  03/28/20 199 lb (90.3 kg)  03/11/20 198 lb (89.8 kg)       Physical Exam Vitals reviewed.  Constitutional:      Appearance: He is well-developed.     Comments: He is alert and appears younger than his age of 5.  HENT:     Head: Normocephalic and atraumatic.     Right Ear: External ear normal.     Left Ear: External ear normal.     Nose: Nose normal.  Eyes:     General: No scleral icterus.    Conjunctiva/sclera: Conjunctivae normal.  Neck:     Thyroid: No thyromegaly.  Cardiovascular:     Rate and Rhythm: Normal rate and regular rhythm.     Heart sounds: Normal heart sounds.  Pulmonary:     Effort: Pulmonary effort is normal.     Breath  sounds: Normal breath sounds.  Abdominal:     Palpations: Abdomen is soft.  Musculoskeletal:     Comments: Minimal right knee effusion with some mild joint line tenderness.  He does have a tense Baker's cyst behind the right knee.  Skin:    General: Skin is warm and dry.  Neurological:     General: No focal deficit present.     Mental Status: He is alert and oriented to person, place, and  time.     Sensory: No sensory deficit.     Motor: No weakness.     Coordination: Coordination normal.     Comments: Tinel's sign negative bilaterally  Psychiatric:        Mood and Affect: Mood normal.        Behavior: Behavior normal.        Thought Content: Thought content normal.        Judgment: Judgment normal.       Results for orders placed or performed in visit on 06/04/20  POCT glycosylated hemoglobin (Hb A1C)  Result Value Ref Range   Hemoglobin A1C 6.5 (A) 4.0 - 5.6 %   HbA1c POC (<> result, manual entry)     HbA1c, POC (prediabetic range)     HbA1c, POC (controlled diabetic range)      Assessment & Plan     1. Type 2 diabetes mellitus with diabetic polyneuropathy, without long-term current use of insulin (HCC) Good control with an A1c of 6.5.  No changes and is at goal. - POCT glycosylated hemoglobin (Hb A1C)  2. Baker's cyst of knee, right By orthopedics.  3. Sensorineural hearing loss (SNHL) of left ear with restricted hearing of right ear By ENT.  4. Benign essential HTN Good control at age 45.  31. CA of prostate ([place]) History of prostate cancer.  No PSAs unless patient becomes symptomatic   No follow-ups on file.      I, [name], MD, have reviewed all documentation for this visit. The documentation on 06/16/20 for the exam, diagnosis, procedures, and orders are all accurate and complete.    Shaley Leavens Cranford Mon, MD  [facility] [phone] (phone) [phone] (fax)  [place]

## 2020-06-04 ENCOUNTER — Other Ambulatory Visit: Payer: Self-pay

## 2020-06-04 ENCOUNTER — Encounter: Payer: Self-pay | Admitting: Family Medicine

## 2020-06-04 ENCOUNTER — Ambulatory Visit (INDEPENDENT_AMBULATORY_CARE_PROVIDER_SITE_OTHER): Payer: Medicare Other | Admitting: Family Medicine

## 2020-06-04 VITALS — BP 147/68 | HR 79 | Temp 98.0°F | Resp 16 | Ht 68.0 in | Wt 193.0 lb

## 2020-06-04 DIAGNOSIS — M7121 Synovial cyst of popliteal space [Baker], right knee: Secondary | ICD-10-CM

## 2020-06-04 DIAGNOSIS — H90A22 Sensorineural hearing loss, unilateral, left ear, with restricted hearing on the contralateral side: Secondary | ICD-10-CM | POA: Diagnosis not present

## 2020-06-04 DIAGNOSIS — C61 Malignant neoplasm of prostate: Secondary | ICD-10-CM | POA: Diagnosis not present

## 2020-06-04 DIAGNOSIS — I10 Essential (primary) hypertension: Secondary | ICD-10-CM | POA: Diagnosis not present

## 2020-06-04 DIAGNOSIS — E1142 Type 2 diabetes mellitus with diabetic polyneuropathy: Secondary | ICD-10-CM | POA: Diagnosis not present

## 2020-06-04 LAB — POCT GLYCOSYLATED HEMOGLOBIN (HGB A1C): Hemoglobin A1C: 6.5 % — AB (ref 4.0–5.6)

## 2020-06-04 NOTE — Patient Instructions (Signed)
Try Tumeric and CBD gummies

## 2020-07-01 DIAGNOSIS — M79641 Pain in right hand: Secondary | ICD-10-CM | POA: Diagnosis not present

## 2020-07-01 DIAGNOSIS — Z79899 Other long term (current) drug therapy: Secondary | ICD-10-CM | POA: Diagnosis not present

## 2020-07-01 DIAGNOSIS — R0600 Dyspnea, unspecified: Secondary | ICD-10-CM | POA: Diagnosis not present

## 2020-07-01 DIAGNOSIS — E785 Hyperlipidemia, unspecified: Secondary | ICD-10-CM | POA: Diagnosis not present

## 2020-07-01 DIAGNOSIS — J449 Chronic obstructive pulmonary disease, unspecified: Secondary | ICD-10-CM | POA: Diagnosis not present

## 2020-07-01 DIAGNOSIS — Z8546 Personal history of malignant neoplasm of prostate: Secondary | ICD-10-CM | POA: Diagnosis not present

## 2020-07-01 DIAGNOSIS — Z7982 Long term (current) use of aspirin: Secondary | ICD-10-CM | POA: Diagnosis not present

## 2020-07-01 DIAGNOSIS — Z87891 Personal history of nicotine dependence: Secondary | ICD-10-CM | POA: Diagnosis not present

## 2020-07-01 DIAGNOSIS — E119 Type 2 diabetes mellitus without complications: Secondary | ICD-10-CM | POA: Diagnosis not present

## 2020-07-01 DIAGNOSIS — Z9079 Acquired absence of other genital organ(s): Secondary | ICD-10-CM | POA: Diagnosis not present

## 2020-07-01 DIAGNOSIS — I10 Essential (primary) hypertension: Secondary | ICD-10-CM | POA: Diagnosis not present

## 2020-07-01 DIAGNOSIS — M1811 Unilateral primary osteoarthritis of first carpometacarpal joint, right hand: Secondary | ICD-10-CM | POA: Diagnosis not present

## 2020-07-01 DIAGNOSIS — S63061A Subluxation of metacarpal (bone), proximal end of right hand, initial encounter: Secondary | ICD-10-CM | POA: Diagnosis not present

## 2020-07-01 DIAGNOSIS — S63218A Subluxation of metacarpophalangeal joint of other finger, initial encounter: Secondary | ICD-10-CM | POA: Diagnosis not present

## 2020-07-01 DIAGNOSIS — Z7984 Long term (current) use of oral hypoglycemic drugs: Secondary | ICD-10-CM | POA: Diagnosis not present

## 2020-07-01 DIAGNOSIS — Z7951 Long term (current) use of inhaled steroids: Secondary | ICD-10-CM | POA: Diagnosis not present

## 2020-07-02 ENCOUNTER — Telehealth: Payer: Self-pay

## 2020-07-02 DIAGNOSIS — M79641 Pain in right hand: Secondary | ICD-10-CM

## 2020-07-02 NOTE — Telephone Encounter (Signed)
Copied from [place] [id number]. Topic: General - Other >> Jul 01, 2020  5:03 PM Pawlus, Brayton Layman A wrote: Reason for CRM: Dr Mina Marble from [facility] pulmonary wanted to let Dr Rosanna Randy know that the pt has right hand pain, an xray was completed that shows the pt has arthiris. Dr Mina Marble is recommending that the pt be connected with ortho or pt. >> Jul 01, 2020  5:10 PM Pawlus, Brayton Layman A wrote: Xray showed Arthritis

## 2020-07-10 NOTE — Telephone Encounter (Signed)
Order for referral placed.

## 2020-07-10 NOTE — Telephone Encounter (Signed)
Refer to Occupational therapy for this

## 2020-07-10 NOTE — Addendum Note (Signed)
Addended by: Wilburt Finlay on: 07/10/2020 01:17 PM   Modules accepted: Orders

## 2020-08-14 DIAGNOSIS — M7121 Synovial cyst of popliteal space [Baker], right knee: Secondary | ICD-10-CM | POA: Diagnosis not present

## 2020-09-07 DIAGNOSIS — Z20822 Contact with and (suspected) exposure to covid-19: Secondary | ICD-10-CM | POA: Diagnosis not present

## 2020-09-23 ENCOUNTER — Other Ambulatory Visit: Payer: Self-pay | Admitting: Family Medicine

## 2020-09-23 ENCOUNTER — Ambulatory Visit (INDEPENDENT_AMBULATORY_CARE_PROVIDER_SITE_OTHER): Payer: Medicare Other | Admitting: Family Medicine

## 2020-09-23 VITALS — BP 133/68 | HR 73 | Resp 16 | Wt 194.2 lb

## 2020-09-23 DIAGNOSIS — E7800 Pure hypercholesterolemia, unspecified: Secondary | ICD-10-CM | POA: Diagnosis not present

## 2020-09-23 DIAGNOSIS — E1142 Type 2 diabetes mellitus with diabetic polyneuropathy: Secondary | ICD-10-CM | POA: Diagnosis not present

## 2020-09-23 DIAGNOSIS — Z8546 Personal history of malignant neoplasm of prostate: Secondary | ICD-10-CM

## 2020-09-23 DIAGNOSIS — I10 Essential (primary) hypertension: Secondary | ICD-10-CM | POA: Diagnosis not present

## 2020-09-23 DIAGNOSIS — I2510 Atherosclerotic heart disease of native coronary artery without angina pectoris: Secondary | ICD-10-CM | POA: Diagnosis not present

## 2020-09-23 LAB — POCT GLYCOSYLATED HEMOGLOBIN (HGB A1C): Hemoglobin A1C: 6.3 % — AB (ref 4.0–5.6)

## 2020-09-23 NOTE — Progress Notes (Signed)
Established patient visit   Patient: Michael Murphy   DOB: [birth year]   87 y.o. Male  MRN: [id number] Visit Date: 09/23/2020  Today's healthcare provider: [name], MD   Chief Complaint  Patient presents with   Diabetes   Hypertension   Hyperlipidemia   Subjective    HPI  Patient feels well and blood sugars been averaging about 100.  No hypoglycemia. Compliant with all his medications and is having no side effects.  Overall he is feeling pretty well. Diabetes Mellitus Type II, follow-up  Lab Results  Component Value Date   HGBA1C 6.5 (A) 06/04/2020   HGBA1C 6.5 (H) 02/06/2020   HGBA1C 6.7 (A) 10/18/2019   Last seen for diabetes 4 months ago.  Management since then includes continuing the same treatment. He reports excellent compliance with treatment. He is not having side effects.    Home blood sugar records: fasting range: 100  Episodes of hypoglycemia? No     Current insulin regiment: none  Most Recent Eye Exam: 05/17/2018  --------------------------------------------------------------------------------------------------- Hypertension, follow-up  BP Readings from Last 3 Encounters:  09/23/20 133/68  06/04/20 (!) 147/68  03/28/20 (!) 156/84   Wt Readings from Last 3 Encounters:  09/23/20 194 lb 3.2 oz (88.1 kg)  06/04/20 193 lb (87.5 kg)  03/28/20 199 lb (90.3 kg)     He was last seen for hypertension 4 months ago.  BP at that visit was 147/68. Management since that visit includes; Good control at age 24. He reports excellent compliance with treatment. He is not having side effects.  He is not exercising. He is adherent to low salt diet.   Outside blood pressures are not being checked.  He does not smoke.  Use of agents associated with hypertension: none.   --------------------------------------------------------------------------------------------------- Lipid/Cholesterol, follow-up  Last Lipid Panel: Lab Results  Component Value  Date   CHOL 166 07/18/2019   LDLCALC 83 07/18/2019   HDL 73 07/18/2019   TRIG 45 07/18/2019    He was last seen for this 07/18/2019.  Management since that visit includes; on simvastatin.  He reports excellent compliance with treatment. He is not having side effects.   He is following a Regular diet. Current exercise: none  Last metabolic panel Lab Results  Component Value Date   GLUCOSE 111 (H) 11/27/2019   NA 140 11/27/2019   K 3.8 11/27/2019   BUN 9 11/27/2019   CREATININE 0.76 11/27/2019   GFRNONAA >60 11/27/2019   GFRAA >60 11/27/2019   CALCIUM 9.5 11/27/2019   AST 23 11/21/2019   ALT 18 11/21/2019   The ASCVD Risk score (Goff DC Jr., et al., 2013) failed to calculate for the following reasons:   The 2013 ASCVD risk score is only valid for ages 41 to 50  ---------------------------------------------------------------------------------------------------   Patient Active Problem List   Diagnosis Date Noted   Eczema 03/12/2020   COPD exacerbation ([place]) 11/27/2019   Erectile dysfunction after radical prostatectomy 01/11/2019   Encysted hydrocele 01/11/2019   Scrotal pain 05/02/2018   Right hydrocele 05/02/2018   Spermatocele 05/02/2018   Type 2 diabetes mellitus without complication, without long-term current use of insulin ([place]) 05/04/2017   Abnormal laboratory test 07/12/2014   Carpal tunnel syndrome 07/12/2014   Adiposity 07/12/2014   Neurosis, posttraumatic 07/12/2014   CA of prostate ([place]) 07/12/2014   Excessive urination at night 01/23/2013   ED (erectile dysfunction) of organic origin 01/23/2013   Malignant neoplasm of prostate ([place]) 12/07/2011  Prostate cancer ([place]) 12/07/2011   Diabetic neuropathy ([place]) 02/12/2009   Allergic rhinitis 02/11/2007   Benign essential HTN 02/11/2007   Arthritis, degenerative 02/11/2007   OP (osteoporosis) 02/11/2007   Hypercholesterolemia without hypertriglyceridemia 02/11/2007   Past Medical History:  Diagnosis  Date   Adiposity 07/12/2014   Allergic rhinitis 02/11/2007   Arthritis, degenerative 02/11/2007   Benign essential HTN 02/11/2007   Goal BP< 130/80 due DMII.    CA of prostate ([place]) 07/12/2014   Carpal tunnel syndrome 07/12/2014   Diabetes mellitus without complication ([place])    Diabetic neuropathy ([place]) 02/12/2009   s/p Lifestyle Education Diabetic Classes 2012.  Monofilament intact 09/18/2013-normal bilaterally.    ED (erectile dysfunction) of organic origin 01/23/2013   Epididymitis 07/12/2014   Excessive urination at night 01/23/2013   Hypercholesterolemia without hypertriglyceridemia 02/11/2007   Hypertension    Neurosis, posttraumatic 07/12/2014   ([place] [facility]) and ([place]).    OP (osteoporosis) 02/11/2007   Allergies  Allergen Reactions   Accupril [Quinapril Hcl] Anaphylaxis   Quinapril Swelling       Medications: Outpatient Medications Prior to Visit  Medication Sig   albuterol (PROVENTIL HFA;VENTOLIN HFA) 108 (90 Base) MCG/ACT inhaler Inhale 2 puffs into the lungs every 6 (six) hours as needed for wheezing or shortness of breath.   amLODipine (NORVASC) 10 MG tablet Take 0.5 tablets by mouth daily.    aspirin 81 MG chewable tablet Chew by mouth daily.   calcium carbonate (OS-CAL) 600 MG TABS tablet Take 600 mg by mouth daily.   FLAXSEED, LINSEED, PO Take 2 capsules by mouth daily.   fluticasone (FLONASE) 50 MCG/ACT nasal spray Place 1 spray into both nostrils as needed.    Garlic Oil 6734 MG CAPS Take 1 capsule by mouth daily.   LUBRICATING PLUS EYE DROPS 0.5 % SOLN Apply to eye.   meclizine (ANTIVERT) 25 MG tablet Take 1 tablet (25 mg total) by mouth as needed for dizziness. (Patient not taking: Reported on 03/28/2020)   mometasone (ELOCON) 0.1 % cream Apply 1 application topically daily. Apply small amount to affected areas   Multiple Vitamins-Minerals (MENS MULTIVITAMIN PLUS PO) Take by mouth daily.   Omega-3 Fatty Acids (FISH OIL) 1000 MG CAPS Take 1 capsule by mouth daily.    simvastatin (ZOCOR) 20 MG tablet Take 1 tablet by mouth at bedtime.   tiotropium (SPIRIVA) 18 MCG inhalation capsule Place 18 mcg into inhaler and inhale every morning.   triamcinolone cream (KENALOG) 0.1 % Apply 1 application topically 2 (two) times daily. (Patient not taking: Reported on 03/28/2020)   No facility-administered medications prior to visit.    Review of Systems  Constitutional:  Negative for appetite change, chills and fever.  Respiratory:  Negative for chest tightness, shortness of breath and wheezing.   Cardiovascular:  Negative for chest pain and palpitations.  Gastrointestinal:  Negative for abdominal pain, nausea and vomiting.       Objective    BP 133/68   Pulse 73   Resp 16   Wt 194 lb 3.2 oz (88.1 kg)   SpO2 99%   BMI 29.53 kg/m  BP Readings from Last 3 Encounters:  09/23/20 133/68  06/04/20 (!) 147/68  03/28/20 (!) 156/84   Wt Readings from Last 3 Encounters:  09/23/20 194 lb 3.2 oz (88.1 kg)  06/04/20 193 lb (87.5 kg)  03/28/20 199 lb (90.3 kg)       Physical Exam Vitals reviewed.  Constitutional:      Appearance: He is  well-developed.     Comments: He is alert and appears younger than his age of 78.  HENT:     Head: Normocephalic and atraumatic.     Right Ear: External ear normal.     Left Ear: External ear normal.     Nose: Nose normal.  Eyes:     General: No scleral icterus.    Conjunctiva/sclera: Conjunctivae normal.  Neck:     Thyroid: No thyromegaly.  Cardiovascular:     Rate and Rhythm: Normal rate and regular rhythm.     Heart sounds: Normal heart sounds.  Pulmonary:     Effort: Pulmonary effort is normal.     Breath sounds: Normal breath sounds.  Abdominal:     Palpations: Abdomen is soft.  Skin:    General: Skin is warm and dry.  Neurological:     General: No focal deficit present.     Mental Status: He is alert and oriented to person, place, and time.     Sensory: No sensory deficit.     Motor: No weakness.      Coordination: Coordination normal.  Psychiatric:        Mood and Affect: Mood normal.        Behavior: Behavior normal.        Thought Content: Thought content normal.        Judgment: Judgment normal.      No results found for any visits on 09/23/20.  Assessment & Plan     1. Type 2 diabetes mellitus with diabetic polyneuropathy, without long-term current use of insulin (HCC) 1C under good control at 6.3%.  He is controlled with diet and exercise alone. - POCT glycosylated hemoglobin (Hb A1C) - CBC with Differential/Platelet - Comprehensive metabolic panel  2. Benign essential HTN Controlled with amlodipine - CBC with Differential/Platelet - TSH  3. Coronary artery disease, unspecified vessel or lesion type, unspecified whether angina present, unspecified whether native or transplanted heart All risk factors treated - Comprehensive metabolic panel  4. Hypercholesterolemia without hypertriglyceridemia On simvastatin. - Lipid panel  5. History of prostate cancer    No follow-ups on file.      I, [name], MD, have reviewed all documentation for this visit. The documentation on 09/28/20 for the exam, diagnosis, procedures, and orders are all accurate and complete.    Telsa Dillavou Cranford Mon, MD  [facility] [phone] (phone) [phone] (fax)  [place]

## 2020-09-24 LAB — CBC WITH DIFFERENTIAL/PLATELET
Basophils Absolute: 0 10*3/uL (ref 0.0–0.2)
Basos: 0 %
EOS (ABSOLUTE): 0.4 10*3/uL (ref 0.0–0.4)
Eos: 4 %
Hematocrit: 41.9 % (ref 37.5–51.0)
Hemoglobin: 13.5 g/dL (ref 13.0–17.7)
Immature Grans (Abs): 0 10*3/uL (ref 0.0–0.1)
Immature Granulocytes: 0 %
Lymphocytes Absolute: 2.7 10*3/uL (ref 0.7–3.1)
Lymphs: 26 %
MCH: 27.2 pg (ref 26.6–33.0)
MCHC: 32.2 g/dL (ref 31.5–35.7)
MCV: 85 fL (ref 79–97)
Monocytes Absolute: 0.9 10*3/uL (ref 0.1–0.9)
Monocytes: 8 %
Neutrophils Absolute: 6.3 10*3/uL (ref 1.4–7.0)
Neutrophils: 62 %
Platelets: 169 10*3/uL (ref 150–450)
RBC: 4.96 x10E6/uL (ref 4.14–5.80)
RDW: 14.2 % (ref 11.6–15.4)
WBC: 10.3 10*3/uL (ref 3.4–10.8)

## 2020-09-24 LAB — LIPID PANEL
Chol/HDL Ratio: 2.2 ratio (ref 0.0–5.0)
Cholesterol, Total: 193 mg/dL (ref 100–199)
HDL: 89 mg/dL (ref >39)
LDL Chol Calc (NIH): 91 mg/dL (ref 0–99)
Triglycerides: 70 mg/dL (ref 0–149)
VLDL Cholesterol Cal: 13 mg/dL (ref 5–40)

## 2020-09-24 LAB — COMPREHENSIVE METABOLIC PANEL
ALT: 14 IU/L (ref 0–44)
AST: 15 IU/L (ref 0–40)
Albumin/Globulin Ratio: 2 (ref 1.2–2.2)
Albumin: 4.7 g/dL — ABNORMAL HIGH (ref 3.5–4.6)
Alkaline Phosphatase: 61 IU/L (ref 44–121)
BUN/Creatinine Ratio: 10 (ref 10–24)
BUN: 10 mg/dL (ref 10–36)
Bilirubin Total: 0.6 mg/dL (ref 0.0–1.2)
CO2: 23 mmol/L (ref 20–29)
Calcium: 9.9 mg/dL (ref 8.6–10.2)
Chloride: 102 mmol/L (ref 96–106)
Creatinine, Ser: 0.99 mg/dL (ref 0.76–1.27)
Globulin, Total: 2.4 g/dL (ref 1.5–4.5)
Glucose: 98 mg/dL (ref 65–99)
Potassium: 4.7 mmol/L (ref 3.5–5.2)
Sodium: 141 mmol/L (ref 134–144)
Total Protein: 7.1 g/dL (ref 6.0–8.5)
eGFR: 72 mL/min/{1.73_m2} (ref >59)

## 2020-09-24 LAB — TSH: TSH: 1.67 u[IU]/mL (ref 0.450–4.500)

## 2020-11-12 ENCOUNTER — Telehealth: Payer: Self-pay

## 2020-11-12 NOTE — Telephone Encounter (Signed)
Copied from [place] [id number]. Topic: General - Other >> Nov 12, 2020  1:51 PM Leward Quan A wrote: Reason for CRM: Daphine Deutscher patient daughter called in needed to have patient see dr Rosanna Randy before his scheduled appointment. Has a rash on the lower part of his right leg and its a bit red seem to have gotten worst in the last few days, using his creams but still red. Please call Rachelle at Ph# [phone]

## 2020-11-12 NOTE — Telephone Encounter (Signed)
Left message on Daphine Deutscher VM saying that Mr. Tromp can be seen 11/14/20 at 2:00 with Dr. Rosanna Randy.  If this is not convenient, she is to call back to resch.

## 2020-11-13 ENCOUNTER — Telehealth: Payer: Self-pay | Admitting: Family Medicine

## 2020-11-13 NOTE — Telephone Encounter (Signed)
No answer unable to leave a  message for patient to call back and schedule Medicare Annual Wellness Visit (AWV) in office.   If not able to come in office, please offer to do virtually or by telephone.   Last AWV:08/31/2019  Please schedule at anytime with [facility].  If any questions, please contact me at [phone]

## 2020-11-14 ENCOUNTER — Ambulatory Visit (INDEPENDENT_AMBULATORY_CARE_PROVIDER_SITE_OTHER): Payer: Medicare Other | Admitting: Family Medicine

## 2020-11-14 ENCOUNTER — Other Ambulatory Visit: Payer: Self-pay

## 2020-11-14 ENCOUNTER — Encounter: Payer: Self-pay | Admitting: Family Medicine

## 2020-11-14 VITALS — BP 135/64 | HR 89 | Temp 98.3°F | Resp 16 | Wt 198.0 lb

## 2020-11-14 DIAGNOSIS — I2510 Atherosclerotic heart disease of native coronary artery without angina pectoris: Secondary | ICD-10-CM | POA: Diagnosis not present

## 2020-11-14 DIAGNOSIS — E119 Type 2 diabetes mellitus without complications: Secondary | ICD-10-CM | POA: Diagnosis not present

## 2020-11-14 DIAGNOSIS — L309 Dermatitis, unspecified: Secondary | ICD-10-CM

## 2020-11-14 DIAGNOSIS — M47817 Spondylosis without myelopathy or radiculopathy, lumbosacral region: Secondary | ICD-10-CM | POA: Diagnosis not present

## 2020-11-14 DIAGNOSIS — Z8546 Personal history of malignant neoplasm of prostate: Secondary | ICD-10-CM

## 2020-11-14 DIAGNOSIS — L03818 Cellulitis of other sites: Secondary | ICD-10-CM

## 2020-11-14 DIAGNOSIS — L255 Unspecified contact dermatitis due to plants, except food: Secondary | ICD-10-CM | POA: Diagnosis not present

## 2020-11-14 MED ORDER — PREDNISONE 5 MG PO TABS: ORAL_TABLET | ORAL | 1 refills | Status: AC

## 2020-11-14 MED ORDER — TRIAMCINOLONE ACETONIDE 0.1 % EX CREA: 1.0000 "application " | TOPICAL_CREAM | Freq: Two times a day (BID) | CUTANEOUS | 0 refills | Status: DC

## 2020-11-14 MED ORDER — CEPHALEXIN 500 MG PO CAPS
500.0000 mg | ORAL_CAPSULE | Freq: Three times a day (TID) | ORAL | 0 refills | Status: AC
Start: 2020-11-14 — End: 2020-11-21

## 2020-11-14 NOTE — Progress Notes (Signed)
Established patient visit   Patient: Michael Murphy   DOB: 06-Jun-1927   87 y.o. Male  MRN: [id number] Visit Date: 11/14/2020  Today's healthcare provider: [name], MD   Chief Complaint  Patient presents with   Rash   Subjective    Rash This is a recurrent problem. The current episode started in the past 7 days. The affected locations include the left arm. The rash is characterized by itchiness, redness and dryness. Pertinent negatives include no cough, fatigue or shortness of breath. Past treatments include anti-itch cream and topical steroids. The treatment provided mild relief.   Patient also has a mild rash that is mildly pruritic on his right lower leg.  His daughter does request a letter for to the [place] today saying that he needs some assistance with his ADLs due to his advanced age.  I think this is entirely appropriate as he gets around with a walker now.    Medications: Outpatient Medications Prior to Visit  Medication Sig   albuterol (PROVENTIL HFA;VENTOLIN HFA) 108 (90 Base) MCG/ACT inhaler Inhale 2 puffs into the lungs every 6 (six) hours as needed for wheezing or shortness of breath.   amLODipine (NORVASC) 10 MG tablet Take 0.5 tablets by mouth daily.    aspirin 81 MG chewable tablet Chew by mouth daily.   budesonide-formoterol (SYMBICORT) 160-4.5 MCG/ACT inhaler Inhale into the lungs.   calcium carbonate (OS-CAL) 600 MG TABS tablet Take 600 mg by mouth daily.   FLAXSEED, LINSEED, PO Take 2 capsules by mouth daily.   fluticasone (FLONASE) 50 MCG/ACT nasal spray Place 1 spray into both nostrils as needed.    fluticasone-salmeterol (WIXELA INHUB) 250-50 MCG/ACT AEPB INHALE 1 PUFF BY ORAL INHALATION TWO TIMES A DAY IN PLACE OF SYMBICORT   Garlic Oil 123XX123 MG CAPS Take 1 capsule by mouth daily.   LUBRICATING PLUS EYE DROPS 0.5 % SOLN Apply to eye.   meclizine (ANTIVERT) 25 MG tablet Take 1 tablet (25 mg total) by mouth as needed for dizziness. (Patient not  taking: Reported on 03/28/2020)   metFORMIN (GLUCOPHAGE-XR) 500 MG 24 hr tablet TAKE ONE TABLET BY MOUTH EVERY DAY FOR DIABETES   mometasone (ELOCON) 0.1 % cream Apply 1 application topically daily. Apply small amount to affected areas   Multiple Vitamins-Minerals (MENS MULTIVITAMIN PLUS PO) Take by mouth daily.   Omega-3 Fatty Acids (FISH OIL) 1000 MG CAPS Take 1 capsule by mouth daily.   PLAQUENIL 200 MG tablet Take 200 mg by mouth 2 (two) times daily.   simvastatin (ZOCOR) 20 MG tablet Take 1 tablet by mouth at bedtime.   tiotropium (SPIRIVA) 18 MCG inhalation capsule Place 18 mcg into inhaler and inhale every morning.   triamcinolone cream (KENALOG) 0.1 % Apply 1 application topically 2 (two) times daily. (Patient not taking: Reported on 03/28/2020)   WIXELA INHUB 250-50 MCG/ACT AEPB Inhale 1 puff into the lungs 2 (two) times daily.   No facility-administered medications prior to visit.    Review of Systems  Constitutional:  Negative for activity change and fatigue.  Respiratory:  Negative for cough and shortness of breath.   Cardiovascular:  Positive for leg swelling. Negative for chest pain and palpitations.  Skin:  Positive for rash.  Neurological:  Negative for dizziness, light-headedness and headaches.   {Labs  Heme  Chem  Endocrine  Serology  Results Review (optional):23779}   Objective    BP 135/64   Pulse 89   Temp 98.3 F (  36.8 C)   Resp 16   Wt 198 lb (89.8 kg)   BMI 30.11 kg/m  {Show previous vital signs (optional):23777}  Physical Exam Vitals reviewed.  Constitutional:      Appearance: He is well-developed.     Comments: He is alert and appears younger than his age of 15.  HENT:     Head: Normocephalic and atraumatic.     Right Ear: External ear normal.     Left Ear: External ear normal.     Nose: Nose normal.  Eyes:     General: No scleral icterus.    Conjunctiva/sclera: Conjunctivae normal.  Neck:     Thyroid: No thyromegaly.  Cardiovascular:      Rate and Rhythm: Normal rate and regular rhythm.     Heart sounds: Normal heart sounds.  Pulmonary:     Effort: Pulmonary effort is normal.     Breath sounds: Normal breath sounds.  Abdominal:     Palpations: Abdomen is soft.  Skin:    General: Skin is warm and dry.     Comments: Erythematous rash in the left antecubital space.  Slight weeping of this area There is a slight rash on the right lower extremity.  No drainage or fever or chills.  No pain.  No tenderness  Neurological:     General: No focal deficit present.     Mental Status: He is alert and oriented to person, place, and time.     Sensory: No sensory deficit.     Motor: No weakness.     Coordination: Coordination normal.  Psychiatric:        Mood and Affect: Mood normal.        Behavior: Behavior normal.        Thought Content: Thought content normal.        Judgment: Judgment normal.      No results found for any visits on 11/14/20.  Assessment & Plan     1. Rhus dermatitis Plan the left arm rash is most likely Rous dermatitis.  Treat with a low-dose prednisone taper. - predniSONE (DELTASONE) 5 MG tablet; Take 6 tablets (30 mg total) by mouth daily with breakfast for 1 day, THEN 5 tablets (25 mg total) daily with breakfast for 1 day, THEN 4 tablets (20 mg total) daily with breakfast for 1 day, THEN 3 tablets (15 mg total) daily with breakfast for 1 day, THEN 2 tablets (10 mg total) daily with breakfast for 1 day, THEN 1 tablet (5 mg total) daily with breakfast for 1 day.  Dispense: 21 tablet; Refill: 1  2. Dermatitis This could be a chronic eczematous rash also.  Treat with topical steroid - triamcinolone cream (KENALOG) 0.1 %; Apply 1 application topically 2 (two) times daily.  Dispense: 60 g; Refill: 0  3. Cellulitis of other specified site Possible cellulitis of the leg.  Treat with Keflex for a week if this worsens.  Otherwise we will just observe this.  Patient and daughter are agreeable to this. - cephALEXin  (KEFLEX) 500 MG capsule; Take 1 capsule (500 mg total) by mouth 3 (three) times daily for 7 days.  Dispense: 21 capsule; Refill  4. Type 2 diabetes mellitus without complication, without long-term current use of insulin (HCC) On metformin.  Patient on simvastatin also  5. Spondylosis of lumbosacral region without myelopathy or radiculopathy Patient walks with a walker. Doing fairly well.  6. History of prostate cancer    No follow-ups on file.  I, [name], MD, have reviewed all documentation for this visit. The documentation on 11/17/20 for the exam, diagnosis, procedures, and orders are all accurate and complete.    Dawnn Nam Cranford Mon, MD  [facility] [phone] (phone) [phone] (fax)  [place]

## 2020-12-02 ENCOUNTER — Ambulatory Visit: Payer: Self-pay | Admitting: *Deleted

## 2020-12-02 ENCOUNTER — Telehealth: Payer: Self-pay | Admitting: Family Medicine

## 2020-12-02 DIAGNOSIS — L309 Dermatitis, unspecified: Secondary | ICD-10-CM

## 2020-12-02 MED ORDER — TRIAMCINOLONE ACETONIDE 0.1 % EX CREA: 1.0000 "application " | TOPICAL_CREAM | Freq: Two times a day (BID) | CUTANEOUS | 0 refills | Status: DC

## 2020-12-02 MED ORDER — TRIAMCINOLONE ACETONIDE 0.1 % EX CREA: 1.0000 "application " | TOPICAL_CREAM | Freq: Two times a day (BID) | CUTANEOUS | 4 refills | Status: DC

## 2020-12-02 NOTE — Telephone Encounter (Signed)
Reason for Disposition . [1] Caller has URGENT medicine question about med that PCP or specialist prescribed AND [2] triager unable to answer question  Answer Assessment - Initial Assessment Questions 1. NAME of MEDICATION: "What medicine are you calling about?"     Triamcinolone- patient is treating rash- foot/arm- patient accidentally applied arthritis rub and inflamed the area. 2. QUESTION: "What is your question?" (e.g., double dose of medicine, side effect)     Inflammation of rash area- red and inflamed- little pain from itching- patient used wrong cream- Thursday 3. PRESCRIBING HCP: "Who prescribed it?" Reason: if prescribed by specialist, call should be referred to that group.     PCP 4. SYMPTOMS: "Do you have any symptoms?"     Itching/inflammation 5. SEVERITY: If symptoms are present, ask "Are they mild, moderate or severe?"     moderate 6. PREGNANCY:  "Is there any chance that you are pregnant?" "When was your last menstrual period?"     N/a  Protocols used: Medication Question Call-A-AH

## 2020-12-02 NOTE — Telephone Encounter (Signed)
Sent to pharmacy 

## 2020-12-02 NOTE — Telephone Encounter (Signed)
Please review. Thanks!  

## 2020-12-02 NOTE — Telephone Encounter (Signed)
Patient's daughter is calling to report that the rash areas on foot and arm were improving the triamcinolone treatment- but patient accidentally put arthritis rub on Thursday- got tubes confused and inflamed the area.  Now the area is red and raw and itching again. There is also a new area on the shoulder. Patient daughter is requesting an appointment for reevaluation and a refill of medication- in jar so patient does not confuse the tubes and use the wrong medication again. Patient states he is having sever itching. Call to office- request call for provider review.- Please let patient know if Rx sent in.

## 2020-12-02 NOTE — Telephone Encounter (Signed)
Daughter calling to advise OK to fill the triamcinolone cream (KENALOG) 0.1 % 454 grams (in the jar) ALSO Would like the travel size triamcinolone cream (KENALOG) 0.1 % 15 gram tube 4 each (For travel)  [facility] [phone] - [address], [place]  Can she pick up today please?

## 2020-12-02 NOTE — Telephone Encounter (Signed)
We can call in a refill and put in a referral to dermatology but I have no more appointments until a week or 2 from now.

## 2020-12-04 NOTE — Telephone Encounter (Signed)
Left message to see if that is what patient would like to do. Ok for [facility] to ask and advise. I have not placed order for referral. Waiting to hear from the patient/daughter.

## 2020-12-11 NOTE — Telephone Encounter (Signed)
LMOVM for pt's daughter to return call.

## 2020-12-13 ENCOUNTER — Other Ambulatory Visit: Payer: Self-pay | Admitting: Family Medicine

## 2020-12-13 DIAGNOSIS — L309 Dermatitis, unspecified: Secondary | ICD-10-CM

## 2020-12-13 NOTE — Telephone Encounter (Signed)
Medication Refill - Medication: triamcinolone cream (KENALOG) 0.1 % /  Rash is clearing up but they want to make sure he doesn't run out before it does/ please advise   Has the patient contacted their pharmacy? No. (Agent: If no, request that the patient contact the pharmacy for the refill.) (Agent: If yes, when and what did the pharmacy advise?)Pt wanted to speak with provider or see if he would refill   Preferred Pharmacy (with phone number or street name): [facility] [id number], [place]  [place], [place] [address]  Phone:  [phone]  Fax:  [phone] Has the patient been seen for an appointment in the last year OR does the patient have an upcoming appointment? Yes.    Agent: Please be advised that RX refills may take up to 3 business days. We ask that you follow-up with your pharmacy.

## 2020-12-14 NOTE — Telephone Encounter (Signed)
Spoke with Pharmacist at [facility] and was advised a new prescription should be on file. Pharmacist stated she saw pt pick up a tub of med. Refusing this duplicate request.

## 2020-12-16 ENCOUNTER — Telehealth: Payer: Self-pay | Admitting: *Deleted

## 2020-12-16 DIAGNOSIS — L309 Dermatitis, unspecified: Secondary | ICD-10-CM

## 2020-12-16 NOTE — Telephone Encounter (Signed)
Patient requesting early refill on Kenalog cream due to using it on multiple areas of his body, stomach, chest and arms.  TC to Manuela Schwartz, Software engineer at [facility]. Patient is in the pharmacy requesting refill. Per Manuela Schwartz, patient picked up 454 gram container on 12/02/20 and a 60 gram container on 11/14/20. Due to this, NT refused to provide additional refill over the weekend. Pharmacist reported the 454 gram container was rescinded but the patient had picked up the medication already.  Pharmacy will not refill without further instructions from the physician. Routing to the office for review. Patient would like to be notified if a refill will be provided or not.

## 2020-12-16 NOTE — Telephone Encounter (Signed)
Pts daughter calling in stating she still can not pick up the medication from [facility], can this Rx be re-sent?

## 2020-12-16 NOTE — Telephone Encounter (Signed)
They pick up the Rx when it was sent / but he has been using it all over his body and they will run out soon / he uses on both legs. Back and stomach / rash is clearing up but they'd like a refill before they run completely out /please send refill or call daughter to advise

## 2020-12-16 NOTE — Addendum Note (Signed)
Addended by: Matilde Sprang on: 12/16/2020 06:16 PM   Modules accepted: Orders

## 2020-12-17 MED ORDER — TRIAMCINOLONE ACETONIDE 0.1 % EX CREA: 1.0000 "application " | TOPICAL_CREAM | Freq: Two times a day (BID) | CUTANEOUS | 3 refills | Status: DC

## 2020-12-17 NOTE — Telephone Encounter (Signed)
Pts daughter called back to check on status of refill/ there was some confusion / pt had picked up a refill for the cream at the end of September but due to the size of his rash they have used it sooner than expected and needed a refill / she contacted the pharmacy but they advised her that the refills were rescinded / please advise if this can be approved and sent to pharmacy so they can pick up today

## 2020-12-17 NOTE — Telephone Encounter (Signed)
Requested medication (s) are due for refill today   No  Requested medication (s) are on the active medication list  Yes  Future visit scheduled Yes for 01/23/21  Note to clinic-please see previous notes recently sent to the office regarding this patient's refill request.    Requested Prescriptions  Pending Prescriptions Disp Refills   triamcinolone cream (KENALOG) 0.1 % 15 g 4    Sig: Apply 1 application topically 2 (two) times daily. Tube     Dermatology:  Corticosteroids Passed - 12/16/2020  6:16 PM      Passed - Valid encounter within last 12 months    Recent Outpatient Visits           1 month ago Rhus dermatitis   [facility] Jerrol Banana., MD   2 months ago Type 2 diabetes mellitus with diabetic polyneuropathy, without long-term current use of insulin [facility])   [facility] Jerrol Banana., MD   6 months ago Type 2 diabetes mellitus with diabetic polyneuropathy, without long-term current use of insulin [facility])   [facility] Jerrol Banana., MD   8 months ago Baker's cyst of knee, right   [facility] Jerrol Banana., MD   9 months ago [place] Jerrol Banana., MD       Future Appointments             In 3 weeks Stoioff, Ronda Fairly, MD [place]   In 1 month Jerrol Banana., MD [facility]            Refused Prescriptions Disp Refills   triamcinolone cream (KENALOG) 0.1 % 15 g 4    Sig: Apply 1 application topically 2 (two) times daily. Tube     Dermatology:  Corticosteroids Passed - 12/16/2020  6:16 PM      Passed - Valid encounter within last 12 months    Recent Outpatient Visits           1 month ago Rhus dermatitis   [facility] Jerrol Banana., MD   2 months ago Type 2 diabetes mellitus with diabetic polyneuropathy, without long-term current use of insulin [facility])    [facility] Jerrol Banana., MD   6 months ago Type 2 diabetes mellitus with diabetic polyneuropathy, without long-term current use of insulin [facility])   [facility] Jerrol Banana., MD   8 months ago Baker's cyst of knee, right   [facility] Jerrol Banana., MD   9 months ago [place] Jerrol Banana., MD       Future Appointments             In 3 weeks Stoioff, Ronda Fairly, MD [place]   In 1 month Jerrol Banana., MD [facility] [place]

## 2020-12-17 NOTE — Telephone Encounter (Signed)
See other patient message.

## 2020-12-17 NOTE — Addendum Note (Signed)
Addended by: Ashley Royalty E on: 12/17/2020 04:42 PM   Modules accepted: Orders

## 2020-12-18 ENCOUNTER — Telehealth: Payer: Self-pay

## 2020-12-18 MED ORDER — TRIAMCINOLONE ACETONIDE 0.1 % EX CREA: 1.0000 "application " | TOPICAL_CREAM | Freq: Two times a day (BID) | CUTANEOUS | 4 refills | Status: DC

## 2020-12-18 NOTE — Telephone Encounter (Signed)
Copied from [place] [id number]. Topic: Quick Communication - Rx Refill/Question >> Dec 17, 2020  4:37 PM Erick Blinks wrote: Pt's daughter called following up on refill of medication, requesting to pick Rx up today

## 2020-12-18 NOTE — Telephone Encounter (Signed)
Rx and refills was sent to pharmacy 12/18/2020.

## 2020-12-19 ENCOUNTER — Ambulatory Visit: Payer: Medicare Other | Admitting: Family Medicine

## 2020-12-24 ENCOUNTER — Ambulatory Visit: Payer: Self-pay

## 2020-12-24 ENCOUNTER — Other Ambulatory Visit: Payer: Self-pay

## 2020-12-24 DIAGNOSIS — L298 Other pruritus: Secondary | ICD-10-CM

## 2020-12-24 DIAGNOSIS — L299 Pruritus, unspecified: Secondary | ICD-10-CM

## 2020-12-24 NOTE — Telephone Encounter (Signed)
Pt daughter called regarding pt having breakout with rash again and wanting to be seen in the office. Pt had the rash in Sept and had OV with Dr. Rosanna Randy. He prescribed kenalog cream 0.1% which was effective when he started using the cream. The rash has came back and now is widespread covering the neck, abdomen, arms, legs, and back. The daughter stated the pt isnt using any medications for the itch. Advised to schedule OV but office is closed for lunch. Notified daughter that this note will be routed to the office and someone who be giving her a call back. Call daughter at [phone] to schedule appt with any provider for this week per her request. Care advice given, daughter verbalized understanding.   Message from Jodie Echevaria sent at 12/24/2020 11:10 AM EDT  Patient daughter Ernst Bowler called in to inform dr Rosanna Randy that patient have flared up again with the rash that he have been getting treatment for and she does not know what to do she is hoping that he can be squeezed in on the schedule sometime this week because he is feeling awful. Say that it is on several different places on his body. Please call Ernst Bowler  Ph# [phone]      Reason for Disposition  Mild widespread rash  Answer Assessment - Initial Assessment Questions 1.) CALLER DIAGNOSIS: "What do you think is causing the rash?" (e.g., athlete's foot, chickenpox, hives, impetigo) unsure 2.) LOCALIZED OR WIDESPREAD:  "Is the rash all over (widespread) or mostly just in one area of the body (localized)?" Widespread, back belly, arms legs, neck 3.) NEW MEDICINES: "Are you taking any new medicine?" No 4.) APPEARANCE of RASH: "Describe the rash. What color is it?" (Note: It is difficult to assess rash color in people with darker-colored skin. When this situation occurs, simply ask the caller to describe what they see.) Red whelped areas that have scabbed over at some parts but got worse again  Answer Assessment - Initial Assessment  Questions 1. APPEARANCE of RASH: "Describe the rash." (e.g., spots, blisters, raised areas, skin peeling, scaly)     Red whelped areas that have scabbed over but got worse again 2. SIZE: "How big are the spots?" (e.g., tip of pen, eraser, coin; inches, centimeters)     N/A 3. LOCATION: "Where is the rash located?"     Belly, back, arms, legs, neck 4. COLOR: "What color is the rash?" (Note: It is difficult to assess rash color in people with darker-colored skin. When this situation occurs, simply ask the caller to describe what they see.)     red 5. ONSET: "When did the rash begin?"     unsure 6. FEVER: "Do you have a fever?" If Yes, ask: "What is your temperature, how was it measured, and when did it start?"     No 7. ITCHING: "Does the rash itch?" If Yes, ask: "How bad is the itch?" (Scale 1-10; or mild, moderate, severe)     Yes 8. CAUSE: "What do you think is causing the rash?"     Had breakout before and was prescribed steroid cream 9. MEDICINE FACTORS: "Have you started any new medicines within the last 2 weeks?" (e.g., antibiotics)      no 10. OTHER SYMPTOMS: "Do you have any other symptoms?" (e.g., dizziness, headache, sore throat, joint pain)       no 11. PREGNANCY: "Is there any chance you are pregnant?" "When was your last menstrual period?"       N/A  Protocols used: Rash - Guideline Selection-A-AH, Rash or Redness - [facility]

## 2020-12-26 ENCOUNTER — Other Ambulatory Visit: Payer: Self-pay

## 2020-12-26 ENCOUNTER — Encounter: Payer: Self-pay | Admitting: Dermatology

## 2020-12-26 ENCOUNTER — Ambulatory Visit (INDEPENDENT_AMBULATORY_CARE_PROVIDER_SITE_OTHER): Payer: Medicare Other | Admitting: Dermatology

## 2020-12-26 DIAGNOSIS — R21 Rash and other nonspecific skin eruption: Secondary | ICD-10-CM

## 2020-12-26 MED ORDER — PREDNISONE 5 MG PO TABS
ORAL_TABLET | ORAL | 0 refills | Status: DC
Start: 2020-12-26 — End: 2021-07-18

## 2020-12-26 MED ORDER — TRIAMCINOLONE ACETONIDE 0.1 % EX OINT: TOPICAL_OINTMENT | CUTANEOUS | 0 refills | Status: DC

## 2020-12-26 NOTE — Progress Notes (Signed)
   New Patient Visit  Subjective  Michael Murphy is a 87 y.o. male who presents for the following: Rash. On the trunk and extremities. Has been present for about a month now and originally started on the R leg. He was prescribed Prednisone and Triamcinolone which helped the rash, but after mistakenly applying Diclofenac 1% cream all over his body twice the rash returned.    The following portions of the chart were reviewed this encounter and updated as appropriate:   Tobacco  Allergies  Meds  Problems  Med Hx  Surg Hx  Fam Hx      Review of Systems:  No other skin or systemic complaints except as noted in HPI or Assessment and Plan.  Objective  Well appearing patient in no apparent distress; mood and affect are within normal limits.  A focused examination was performed including the trunk and extremities. Relevant physical exam findings are noted in the Assessment and Plan.  Trunk, extremities Lichenified erythematous hyperpigmented plaques on the hands and legs. Scaly erythematous and hyperpigmented plaques of the legs, arms, and back. Finger webs are clear. Post legs with excoriated polygonal purple papules coalescing to plaques.   Assessment & Plan  Rash Trunk, extremities  Likely allergic reaction (possibly lichenoid drug) from Diclofenac with Id reaction   Discussed with patient avoiding diclofenac, aspirin, ibuprofen, and naproxen as they are similar medications he could also react to those.   Continue TMC 0.1% ointment to aa's QD-BID. Topical steroids (such as triamcinolone, fluocinolone, fluocinonide, mometasone, clobetasol, halobetasol, betamethasone, hydrocortisone) can cause thinning and lightening of the skin if they are used for too long in the same area. Your physician has selected the right strength medicine for your problem and area affected on the body. Please use your medication only as directed by your physician to prevent side effects.   Take prednisone by  mouth daily in morning with food starting at 60 mg dosage and gradually decreasing daily dose as directed until gone for a two week taper.  Patient was given written instructions.  Potential side effects reviewed.  Risks of prednisone taper discussed including mood irritability, insomnia, weight gain, stomach ulcers, increased risk of infection, increased blood sugar (diabetes), hypertension, osteoporosis with long-term or frequent use, and rare risk of avascular necrosis of the hip.   Discussed wet wraps or Saran wrap to help absorb the TMC 0.1% ointment for faster relief..   If recurrence or not clearing, plan two punch biopsies - H&E and DIF to r/o hypersensitivity vs bullous pemphigoid vs lichen planus and lichenoid drug reaction.  triamcinolone ointment (KENALOG) 0.1 % - Trunk, extremities Apply to aa's rash QD-BID PRN. Avoid the face, groin, and axilla.  predniSONE (DELTASONE) 5 MG tablet - Trunk, extremities Take as directed (patient given instructions)  Return for rash follow up in 3-4 weeks.  Luther Redo, CMA, am acting as scribe for Forest Gleason, MD .  Documentation: I have reviewed the above documentation for accuracy and completeness, and I agree with the above.  Forest Gleason, MD

## 2020-12-26 NOTE — Patient Instructions (Addendum)
Risks of prednisone taper discussed including mood irritability, insomnia, weight gain, stomach ulcers, increased risk of infection, increased blood sugar (diabetes), hypertension, osteoporosis with long-term or frequent use, and rare risk of avascular necrosis of the hip.   Topical steroids (such as triamcinolone, fluocinolone, fluocinonide, mometasone, clobetasol, halobetasol, betamethasone, hydrocortisone) can cause thinning and lightening of the skin if they are used for too long in the same area. Your physician has selected the right strength medicine for your problem and area affected on the body. Please use your medication only as directed by your physician to prevent side effects.   2 Week Prednisone Taper  You will be given a prescription for 100 tablets of oral Prednisone. It is very important that you take this according to the exact schedule provided below. This type of regimen for taking medication is often called a "taper", because your dosage will steadily decrease over a two week period until it is discontinued altogether.  ALWAYS take this medicine with food to prevent it from irritating your stomach. You should also take your Prednisone during morning hours.  Call the clinic at [phone] if you gain more than two pounds in one day, notice swelling anywhere on your body, have shortness of breath, black or red bowel movements, brown or red vomitus, desire to drink large amounts of fluids, a fever, or extreme weakness.   Oral Prednisone over Two Weeks  Day  Week 1  Week 2   1  12  tablets  7 tablets   2  12 tablets  6 tablets   3  11 tablets  5 tablets   4  10 tablets  4 tablets   5  10 tablets  3 tablets   6  9 tablets  2 tablets   7  8 tablets  1 tablet    If you have any questions or concerns for your doctor, please call our main line at [phone] and press option 4 to reach your doctor's medical assistant. If no one answers, please leave a voicemail as directed and we  will return your call as soon as possible. Messages left after 4 pm will be answered the following business day.   You may also send [place] a message via [place]. We typically respond to MyChart messages within 1-2 business days.  For prescription refills, please ask your pharmacy to contact our office. Our fax number is [phone].  If you have an urgent issue when the clinic is closed that cannot wait until the next business day, you can page your doctor at the number below.    Please note that while we do our best to be available for urgent issues outside of office hours, we are not available 24/7.   If you have an urgent issue and are unable to reach [place], you may choose to seek medical care at your doctor's office, retail clinic, urgent care center, or emergency room.  If you have a medical emergency, please immediately call 911 or go to the emergency department.  Pager Numbers  - Dr. Nehemiah Massed: [phone]  - Dr. Laurence Ferrari: [phone]  - Dr. Nicole Kindred: [phone]  In the event of inclement weather, please call our main line at [phone] for an update on the status of any delays or closures.  Dermatology Medication Tips: Please keep the boxes that topical medications come in in order to help keep track of the instructions about where and how to use these. Pharmacies typically print the medication instructions only on the boxes and  not directly on the medication tubes.   If your medication is too expensive, please contact our office at [phone] option 4 or send [place] a message through [place].   We are unable to tell what your co-pay for medications will be in advance as this is different depending on your insurance coverage. However, we may be able to find a substitute medication at lower cost or fill out paperwork to get insurance to cover a needed medication.   If a prior authorization is required to get your medication covered by your insurance company, please allow [place] 1-2  business days to complete this process.  Drug prices often vary depending on where the prescription is filled and some pharmacies may offer cheaper prices.  The website www.goodrx.com contains coupons for medications through different pharmacies. The prices here do not account for what the cost may be with help from insurance (it may be cheaper with your insurance), but the website can give you the price if you did not use any insurance.  - You can print the associated coupon and take it with your prescription to the pharmacy.  - You may also stop by our office during regular business hours and pick up a GoodRx coupon card.  - If you need your prescription sent electronically to a different pharmacy, notify our office through [facility] or by phone at [phone] option 4.

## 2021-01-09 ENCOUNTER — Other Ambulatory Visit: Payer: Self-pay

## 2021-01-09 ENCOUNTER — Encounter: Payer: Self-pay | Admitting: Urology

## 2021-01-09 ENCOUNTER — Ambulatory Visit (INDEPENDENT_AMBULATORY_CARE_PROVIDER_SITE_OTHER): Payer: Medicare Other | Admitting: Urology

## 2021-01-09 VITALS — BP 154/71 | HR 103 | Ht 68.0 in | Wt 192.0 lb

## 2021-01-09 DIAGNOSIS — C61 Malignant neoplasm of prostate: Secondary | ICD-10-CM

## 2021-01-09 NOTE — Progress Notes (Signed)
01/09/2021 1:30 PM   Michael Murphy 25-May-1927 [id number]  Referring provider: Jerrol Banana., MD [address] [place],  [place] [address]  Chief Complaint  Patient presents with   Follow-up    Urologic history:  1.  Prostate cancer -Radical prostatectomy Dr. Quillian Quince 2003; pathologic stage/grade unknown -PSA detectable last several years 0.7-0.9 range   2.  Erectile dysfunction   3.  Hydrocele -Large right hydrocele   4.  Spermatocele -Large right spermatocele   HPI: 87 y.o. male presents for annual follow-up  Doing well since last visit No bothersome LUTS Denies dysuria, gross hematuria Denies flank, abdominal or pelvic pain  PMH: Past Medical History:  Diagnosis Date   Adiposity 07/12/2014   Allergic rhinitis 02/11/2007   Arthritis, degenerative 02/11/2007   Benign essential HTN 02/11/2007   Goal BP< 130/80 due DMII.    CA of prostate ([place]) 07/12/2014   Carpal tunnel syndrome 07/12/2014   Diabetes mellitus without complication ([place])    Diabetic neuropathy ([place]) 02/12/2009   s/p Lifestyle Education Diabetic Classes 2012.  Monofilament intact 09/18/2013-normal bilaterally.    ED (erectile dysfunction) of organic origin 01/23/2013   Epididymitis 07/12/2014   Excessive urination at night 01/23/2013   Hypercholesterolemia without hypertriglyceridemia 02/11/2007   Hypertension    Neurosis, posttraumatic 07/12/2014   ([place] [facility]) and ([place]).    OP (osteoporosis) 02/11/2007    Surgical History: Past Surgical History:  Procedure Laterality Date   CATARACT EXTRACTION     KNEE ARTHROSCOPY     left   MULTIPLE TOOTH EXTRACTIONS     PROSTATECTOMY     ROTATOR CUFF REPAIR      Home Medications:  Allergies as of 01/09/2021       Reactions   Accupril [quinapril Hcl] Anaphylaxis   Quinapril Swelling   Flunisolide    Other reaction(s): Burning sensation of nose        Medication List        Accurate as of January 09, 2021  1:30 PM. If you  have any questions, ask your nurse or doctor.          STOP taking these medications    budesonide-formoterol 160-4.5 MCG/ACT inhaler Commonly known as: SYMBICORT Stopped by: Abbie Sons, MD   Garlic Oil 4818 MG Caps Stopped by: Abbie Sons, MD   Plaquenil 200 MG tablet Generic drug: hydroxychloroquine Stopped by: Abbie Sons, MD       TAKE these medications    albuterol 108 (90 Base) MCG/ACT inhaler Commonly known as: VENTOLIN HFA Inhale 2 puffs into the lungs every 6 (six) hours as needed for wheezing or shortness of breath.   amLODipine 10 MG tablet Commonly known as: NORVASC Take 0.5 tablets by mouth daily.   aspirin 81 MG chewable tablet Chew by mouth daily.   calcium carbonate 600 MG Tabs tablet Commonly known as: OS-CAL Take 600 mg by mouth daily.   Fish Oil 1000 MG Caps Take 1 capsule by mouth daily.   FLAXSEED (LINSEED) PO Take 2 capsules by mouth daily.   fluticasone 50 MCG/ACT nasal spray Commonly known as: FLONASE Place 1 spray into both nostrils as needed.   Lubricating Plus Eye Drops 0.5 % Soln Generic drug: Carboxymethylcellulose Sod PF Apply to eye.   meclizine 25 MG tablet Commonly known as: ANTIVERT Take 1 tablet (25 mg total) by mouth as needed for dizziness.   MENS MULTIVITAMIN PLUS PO Take by mouth daily.   mometasone 0.1 %  cream Commonly known as: Elocon Apply 1 application topically daily. Apply small amount to affected areas   predniSONE 5 MG tablet Commonly known as: DELTASONE Take as directed (patient given instructions)   simvastatin 20 MG tablet Commonly known as: ZOCOR Take 1 tablet by mouth at bedtime.   tiotropium 18 MCG inhalation capsule Commonly known as: SPIRIVA Place 18 mcg into inhaler and inhale every morning.   triamcinolone cream 0.1 % Commonly known as: KENALOG Apply 1 application topically 2 (two) times daily. Tube   triamcinolone cream 0.1 % Commonly known as: KENALOG Apply 1  application topically 2 (two) times daily. Tube   triamcinolone ointment 0.1 % Commonly known as: KENALOG Apply to aa's rash QD-BID PRN. Avoid the face, groin, and axilla.   Wixela Inhub 250-50 MCG/ACT Aepb Generic drug: fluticasone-salmeterol INHALE 1 PUFF BY ORAL INHALATION TWO TIMES A DAY IN PLACE OF SYMBICORT   Wixela Inhub 250-50 MCG/ACT Aepb Generic drug: fluticasone-salmeterol Inhale 1 puff into the lungs 2 (two) times daily.        Allergies:  Allergies  Allergen Reactions   Accupril [Quinapril Hcl] Anaphylaxis   Quinapril Swelling   Flunisolide     Other reaction(s): Burning sensation of nose    Family History: Family History  Problem Relation Age of Onset   Hypertension Mother    Stroke Mother    Diabetes Sister    Hypertension Sister    Diabetes Brother    Diabetes Brother     Social History:  reports that he quit smoking about 29 years ago. His smoking use included cigarettes. He has a 9.00 pack-year smoking history. He has never used smokeless tobacco. He reports current alcohol use of about 1.0 - 3.0 standard drink per week. He reports that he does not use drugs.   Physical Exam: BP (!) 154/71   Pulse (!) 103   Ht 5\' 8"  (1.727 m)   Wt 192 lb (87.1 kg)   BMI 29.19 kg/m   Constitutional:  Alert and oriented, No acute distress. HEENT: [place] AT, moist mucus membranes.  Trachea midline, no masses. Cardiovascular: No clubbing, cyanosis, or edema. Respiratory: Normal respiratory effort, no increased work of breathing. Psychiatric: Normal mood and affect.   Assessment & Plan:    1.  Prostate cancer PSA has been detectable and slowly rising PSA ordered today and he will be notified with the results If no significant increase we will continue annual follow-up   Abbie Sons, [place] [address] [place], [place] [address] [phone]

## 2021-01-10 LAB — PSA: Prostate Specific Ag, Serum: 1.4 ng/mL (ref 0.0–4.0)

## 2021-01-13 ENCOUNTER — Encounter: Payer: Self-pay | Admitting: *Deleted

## 2021-01-15 ENCOUNTER — Ambulatory Visit (INDEPENDENT_AMBULATORY_CARE_PROVIDER_SITE_OTHER): Payer: Medicare Other | Admitting: Dermatology

## 2021-01-15 ENCOUNTER — Other Ambulatory Visit: Payer: Self-pay

## 2021-01-15 ENCOUNTER — Ambulatory Visit (INDEPENDENT_AMBULATORY_CARE_PROVIDER_SITE_OTHER): Payer: Medicare Other

## 2021-01-15 DIAGNOSIS — Z23 Encounter for immunization: Secondary | ICD-10-CM

## 2021-01-15 DIAGNOSIS — R21 Rash and other nonspecific skin eruption: Secondary | ICD-10-CM | POA: Diagnosis not present

## 2021-01-15 MED ORDER — TRIAMCINOLONE ACETONIDE 0.1 % EX OINT: TOPICAL_OINTMENT | CUTANEOUS | 0 refills | Status: DC

## 2021-01-15 NOTE — Progress Notes (Signed)
   Follow-Up Visit   Subjective  Michael Murphy is a 87 y.o. male who presents for the following: Follow-up (Patient here today for rash follow up, possibly 2ndary to diclofenac. Patient took prednisone and has been using TMC 0.1% ointment and has improved. ).  Patient accompanied by daughter.   The following portions of the chart were reviewed this encounter and updated as appropriate:   Tobacco  Allergies  Meds  Problems  Med Hx  Surg Hx  Fam Hx      Review of Systems:  No other skin or systemic complaints except as noted in HPI or Assessment and Plan.  Objective  Well appearing patient in no apparent distress; mood and affect are within normal limits.  A focused examination was performed including legs, back. Relevant physical exam findings are noted in the Assessment and Plan.  legs, back Hyperpigmented patches at legs, back   Assessment & Plan  Rash legs, back  Favor drug reaction to topical diclofenac with possible id reaction, Now resolved.  Postinflammatory hyperpigmentation today. Discussed PIH will fade with time.  May d/c TMC 0.1% ointment and restart if needed for itch or raised, rough rash. Avoid applying to face, groin, and axilla. Use as directed. Long-term use can cause thinning of the skin.  Recommend Vaseline for dryness  Topical steroids (such as triamcinolone, fluocinolone, fluocinonide, mometasone, clobetasol, halobetasol, betamethasone, hydrocortisone) can cause thinning and lightening of the skin if they are used for too long in the same area. Your physician has selected the right strength medicine for your problem and area affected on the body. Please use your medication only as directed by your physician to prevent side effects.    Related Medications predniSONE (DELTASONE) 5 MG tablet Take as directed (patient given instructions)  triamcinolone ointment (KENALOG) 0.1 % Apply to aa's rash QD-BID PRN. Avoid the face, groin, and axilla.  Return  if symptoms worsen or fail to improve.  Graciella Belton, RMA, am acting as scribe for Forest Gleason, MD .  Documentation: I have reviewed the above documentation for accuracy and completeness, and I agree with the above.  Forest Gleason, MD

## 2021-01-15 NOTE — Patient Instructions (Signed)
May d/c TMC 0.1% ointment and restart if needed.Avoid applying to face, groin, and axilla. Use as directed. Long-term use can cause thinning of the skin.  Recommend Vaseline.   Topical steroids (such as triamcinolone, fluocinolone, fluocinonide, mometasone, clobetasol, halobetasol, betamethasone, hydrocortisone) can cause thinning and lightening of the skin if they are used for too long in the same area. Your physician has selected the right strength medicine for your problem and area affected on the body. Please use your medication only as directed by your physician to prevent side effects.   If you have any questions or concerns for your doctor, please call our main line at [phone] and press option 4 to reach your doctor's medical assistant. If no one answers, please leave a voicemail as directed and we will return your call as soon as possible. Messages left after 4 pm will be answered the following business day.   You may also send [place] a message via [place]. We typically respond to MyChart messages within 1-2 business days.  For prescription refills, please ask your pharmacy to contact our office. Our fax number is [phone].  If you have an urgent issue when the clinic is closed that cannot wait until the next business day, you can page your doctor at the number below.    Please note that while we do our best to be available for urgent issues outside of office hours, we are not available 24/7.   If you have an urgent issue and are unable to reach [place], you may choose to seek medical care at your doctor's office, retail clinic, urgent care center, or emergency room.  If you have a medical emergency, please immediately call 911 or go to the emergency department.  Pager Numbers  - Dr. Nehemiah Massed: [phone]  - Dr. Laurence Ferrari: [phone]  - Dr. Nicole Kindred: [phone]  In the event of inclement weather, please call our main line at [phone] for an update on the status of any delays or  closures.  Dermatology Medication Tips: Please keep the boxes that topical medications come in in order to help keep track of the instructions about where and how to use these. Pharmacies typically print the medication instructions only on the boxes and not directly on the medication tubes.   If your medication is too expensive, please contact our office at [phone] option 4 or send [place] a message through [place].   We are unable to tell what your co-pay for medications will be in advance as this is different depending on your insurance coverage. However, we may be able to find a substitute medication at lower cost or fill out paperwork to get insurance to cover a needed medication.   If a prior authorization is required to get your medication covered by your insurance company, please allow [place] 1-2 business days to complete this process.  Drug prices often vary depending on where the prescription is filled and some pharmacies may offer cheaper prices.  The website www.goodrx.com contains coupons for medications through different pharmacies. The prices here do not account for what the cost may be with help from insurance (it may be cheaper with your insurance), but the website can give you the price if you did not use any insurance.  - You can print the associated coupon and take it with your prescription to the pharmacy.  - You may also stop by our office during regular business hours and pick up a GoodRx coupon card.  - If you need your prescription sent  electronically to a different pharmacy, notify our office through [facility] or by phone at [phone] option 4.

## 2021-01-16 ENCOUNTER — Ambulatory Visit: Payer: Medicare Other | Admitting: Dermatology

## 2021-01-21 ENCOUNTER — Encounter: Payer: Self-pay | Admitting: Dermatology

## 2021-01-23 ENCOUNTER — Other Ambulatory Visit: Payer: Self-pay

## 2021-01-23 ENCOUNTER — Ambulatory Visit (INDEPENDENT_AMBULATORY_CARE_PROVIDER_SITE_OTHER): Payer: Medicare Other | Admitting: Family Medicine

## 2021-01-23 VITALS — BP 151/81 | HR 92 | Temp 97.7°F | Ht 68.0 in | Wt 189.2 lb

## 2021-01-23 DIAGNOSIS — I2510 Atherosclerotic heart disease of native coronary artery without angina pectoris: Secondary | ICD-10-CM

## 2021-01-23 DIAGNOSIS — Z8546 Personal history of malignant neoplasm of prostate: Secondary | ICD-10-CM | POA: Diagnosis not present

## 2021-01-23 DIAGNOSIS — M25562 Pain in left knee: Secondary | ICD-10-CM

## 2021-01-23 DIAGNOSIS — I10 Essential (primary) hypertension: Secondary | ICD-10-CM

## 2021-01-23 DIAGNOSIS — G8929 Other chronic pain: Secondary | ICD-10-CM | POA: Diagnosis not present

## 2021-01-23 DIAGNOSIS — M810 Age-related osteoporosis without current pathological fracture: Secondary | ICD-10-CM

## 2021-01-23 DIAGNOSIS — E1149 Type 2 diabetes mellitus with other diabetic neurological complication: Secondary | ICD-10-CM | POA: Diagnosis not present

## 2021-01-23 DIAGNOSIS — C61 Malignant neoplasm of prostate: Secondary | ICD-10-CM

## 2021-01-23 DIAGNOSIS — H90A22 Sensorineural hearing loss, unilateral, left ear, with restricted hearing on the contralateral side: Secondary | ICD-10-CM | POA: Diagnosis not present

## 2021-01-23 DIAGNOSIS — E119 Type 2 diabetes mellitus without complications: Secondary | ICD-10-CM | POA: Diagnosis not present

## 2021-01-23 DIAGNOSIS — M25561 Pain in right knee: Secondary | ICD-10-CM

## 2021-01-23 DIAGNOSIS — E7800 Pure hypercholesterolemia, unspecified: Secondary | ICD-10-CM | POA: Diagnosis not present

## 2021-01-23 LAB — POCT GLYCOSYLATED HEMOGLOBIN (HGB A1C)
Est. average glucose Bld gHb Est-mCnc: 206
Hemoglobin A1C: 8.8 % — AB (ref 4.0–5.6)

## 2021-01-23 MED ORDER — METFORMIN HCL 1000 MG PO TABS: 1000.0000 mg | ORAL_TABLET | Freq: Every day | ORAL | 3 refills | Status: DC

## 2021-01-23 NOTE — Progress Notes (Signed)
Established patient visit   Patient: Michael Murphy   DOB: 10-06-1927   87 y.o. Male  MRN: [id number] Visit Date: 01/23/2021  Today's healthcare provider: [name], MD   Chief Complaint  Patient presents with   Diabetes   Subjective    HPI  Patient comes in today for follow-up of several neck issues.  Dermatology has seen his rash and it is markedly improved.  It is still present but they are treating it with topical steroids and creams. Recently he kneeled down on his by his daughter's car and had trouble getting up.  Since then his left knee is caused him some discomfort and swelling.  Is not bad and does not cause him any problems.  He walks with a Rollator. For his diabetes is on metformin 500 mg daily tolerating that well.  Not check his blood sugars that often they are no complaints related to his diabetes. He is going to to see his family in [place] for the holidays. Diabetes Mellitus Type II, follow-up  Lab Results  Component Value Date   HGBA1C 8.8 (A) 01/23/2021   HGBA1C 6.3 (A) 09/23/2020   HGBA1C 6.5 (A) 06/04/2020   Last seen for diabetes 4 months ago.  Management since then includes continuing the same treatment. He reports good compliance with treatment. He is not having side effects.   Home blood sugar records:  per patient report "high"  Episodes of hypoglycemia? No    Current insulin regiment: none Most Recent Eye Exam: not UTD  ---------------------------------------------------------------------------------------------------     Medications: Outpatient Medications Prior to Visit  Medication Sig   albuterol (PROVENTIL HFA;VENTOLIN HFA) 108 (90 Base) MCG/ACT inhaler Inhale 2 puffs into the lungs every 6 (six) hours as needed for wheezing or shortness of breath.   amLODipine (NORVASC) 10 MG tablet Take 0.5 tablets by mouth daily.    aspirin 81 MG chewable tablet Chew by mouth daily.   calcium carbonate (OS-CAL) 600 MG TABS tablet Take  600 mg by mouth daily.   fluticasone (FLONASE) 50 MCG/ACT nasal spray Place 1 spray into both nostrils as needed.    LUBRICATING PLUS EYE DROPS 0.5 % SOLN Apply to eye.   meclizine (ANTIVERT) 25 MG tablet Take 1 tablet (25 mg total) by mouth as needed for dizziness.   mometasone (ELOCON) 0.1 % cream Apply 1 application topically daily. Apply small amount to affected areas   Multiple Vitamins-Minerals (MENS MULTIVITAMIN PLUS PO) Take by mouth daily.   Omega-3 Fatty Acids (FISH OIL) 1000 MG CAPS Take 1 capsule by mouth daily.   predniSONE (DELTASONE) 5 MG tablet Take as directed (patient given instructions)   simvastatin (ZOCOR) 20 MG tablet Take 1 tablet by mouth at bedtime.   tiotropium (SPIRIVA) 18 MCG inhalation capsule Place 18 mcg into inhaler and inhale every morning.   triamcinolone cream (KENALOG) 0.1 % Apply 1 application topically 2 (two) times daily. Tube   triamcinolone cream (KENALOG) 0.1 % Apply 1 application topically 2 (two) times daily. Tube   triamcinolone ointment (KENALOG) 0.1 % Apply to aa's rash QD-BID PRN. Avoid the face, groin, and axilla.   WIXELA INHUB 250-50 MCG/ACT AEPB Inhale 1 puff into the lungs 2 (two) times daily.   FLAXSEED, LINSEED, PO Take 2 capsules by mouth daily. (Patient not taking: Reported on 01/23/2021)   fluticasone-salmeterol (WIXELA INHUB) 250-50 MCG/ACT AEPB INHALE 1 PUFF BY ORAL INHALATION TWO TIMES A DAY IN PLACE OF SYMBICORT   No facility-administered  medications prior to visit.    Review of Systems  Constitutional:  Negative for appetite change, chills and fever.  Respiratory:  Negative for chest tightness, shortness of breath and wheezing.   Cardiovascular:  Negative for chest pain and palpitations.  Gastrointestinal:  Negative for abdominal pain, nausea and vomiting.       Objective    BP (!) 151/81 (BP Location: Right Arm, Patient Position: Sitting, Cuff Size: Normal)   Pulse 92   Temp 97.7 F (36.5 C) (Oral)   Ht 5\' 8"  (1.727 m)    Wt 189 lb 3.2 oz (85.8 kg)   SpO2 98%   BMI 28.77 kg/m  BP Readings from Last 3 Encounters:  01/23/21 (!) 151/81  01/09/21 (!) 154/71  11/14/20 135/64   Wt Readings from Last 3 Encounters:  01/23/21 189 lb 3.2 oz (85.8 kg)  01/09/21 192 lb (87.1 kg)  11/14/20 198 lb (89.8 kg)      Physical Exam Vitals reviewed.  Constitutional:      Appearance: He is well-developed.     Comments: He is alert and appears younger than his age of 25.  HENT:     Head: Normocephalic and atraumatic.     Right Ear: External ear normal.     Left Ear: External ear normal.     Nose: Nose normal.  Eyes:     General: No scleral icterus.    Conjunctiva/sclera: Conjunctivae normal.  Neck:     Thyroid: No thyromegaly.  Cardiovascular:     Rate and Rhythm: Normal rate and regular rhythm.     Heart sounds: Normal heart sounds.  Pulmonary:     Effort: Pulmonary effort is normal.     Breath sounds: Normal breath sounds.  Abdominal:     Palpations: Abdomen is soft.  Musculoskeletal:     Comments: Some mild swelling of the the overall left knee but mainly has a small Baker's cyst. No effusion appreciated.  Skin:    General: Skin is warm and dry.     Comments: Hyperpigmentation of the right lower extremity on rash is much improved.  Certainly there is no secondary infection or skin breakdown at all.  Neurological:     General: No focal deficit present.     Mental Status: He is alert and oriented to person, place, and time.     Sensory: No sensory deficit.     Motor: No weakness.     Coordination: Coordination normal.  Psychiatric:        Mood and Affect: Mood normal.        Behavior: Behavior normal.        Thought Content: Thought content normal.        Judgment: Judgment normal.      Results for orders placed or performed in visit on 01/23/21  POCT glycosylated hemoglobin (Hb A1C)  Result Value Ref Range   Hemoglobin A1C 8.8 (A) 4.0 - 5.6 %   Est. average glucose Bld gHb Est-mCnc 206      Assessment & Plan     1. Type 2 diabetes mellitus without complication, without long-term current use of insulin (HCC)  - POCT glycosylated hemoglobin (Hb A1C)Now 8.8--was 6.3 After discussion with patient and his daughter will double metformin to 100mg  daily  2. History of prostate cancer   3. Benign essential HTN Good control in 87 yo  4. Other diabetic neurological complication associated with type 2 diabetes mellitus (HCC) Neuropathy  5. Age-related osteoporosis without current pathological fracture  6. Hypercholesterolemia without hypertriglyceridemia Zocor20  7. Sensorineural hearing loss (SNHL) of left ear with restricted hearing of right ear  8.Knee pain/OA    No follow-ups on file.      I, [name], MD, have reviewed all documentation for this visit. The documentation on 01/23/21 for the exam, diagnosis, procedures, and orders are all accurate and complete.    Laisha Rau Cranford Mon, MD  [facility] [phone] (phone) [phone] (fax)  [place]

## 2021-01-27 ENCOUNTER — Ambulatory Visit: Payer: Self-pay | Admitting: Family Medicine

## 2021-04-04 ENCOUNTER — Telehealth: Payer: Self-pay

## 2021-04-04 DIAGNOSIS — R21 Rash and other nonspecific skin eruption: Secondary | ICD-10-CM

## 2021-04-04 MED ORDER — TRIAMCINOLONE ACETONIDE 0.1 % EX OINT: TOPICAL_OINTMENT | CUTANEOUS | 0 refills | Status: DC

## 2021-04-07 NOTE — Telephone Encounter (Signed)
Error

## 2021-04-28 ENCOUNTER — Telehealth: Payer: Self-pay | Admitting: Family Medicine

## 2021-04-28 NOTE — Telephone Encounter (Signed)
Copied from [place] [phone]. Topic: Medicare AWV >> Apr 28, 2021  3:54 PM Cher Nakai R wrote: Reason for CRM:  Left message for patient to call back and schedule Medicare Annual Wellness Visit (AWV) in office.   If not able to come in office, please offer to do virtually or by telephone.   Last AWV: 08/31/2019  Please schedule at anytime with [facility] Health Advisor.  If any questions, please contact me at [phone]

## 2021-06-11 ENCOUNTER — Telehealth: Payer: Self-pay

## 2021-06-11 NOTE — Telephone Encounter (Signed)
Copied from [place] [phone]. Topic: General - Other ?>> Jun 11, 2021 12:34 PM McGill, Nelva Bush wrote: ?Reason for CRM: Pt daughter Daphine Deutscher stated she has filled out the DRP form multiple times and her name is not yet on the pt's information as a primary contact. Pt daughter stated she was going to stop by the office to fill it out again. ? ?Please advise.  ? ?Last DPR form I see is from 08/22/2014 ?

## 2021-06-18 ENCOUNTER — Other Ambulatory Visit: Payer: Self-pay | Admitting: Dermatology

## 2021-06-18 ENCOUNTER — Ambulatory Visit: Payer: Medicare Other | Admitting: Family Medicine

## 2021-06-18 DIAGNOSIS — R21 Rash and other nonspecific skin eruption: Secondary | ICD-10-CM

## 2021-06-27 ENCOUNTER — Telehealth: Payer: Self-pay | Admitting: Family Medicine

## 2021-06-27 NOTE — Telephone Encounter (Signed)
Copied from [place] [phone]. Topic: Medicare AWV ?>> Jun 27, 2021 10:05 AM Cher Nakai R wrote: ?Reason for CRM:  ?Left message for patient to call back and schedule Medicare Annual Wellness Visit (AWV) in office.  ? ?If unable to come into the office for AWV,  please offer to do virtually or by telephone. ? ?Last AWV: 08/31/2019 ? ?Please schedule at anytime with [facility] Health Advisor. ? ?30 minute appointment for Virtual or phone ?45 minute appointment for in office or Initial virtual/phone ? ?Any questions, please contact me at [phone] ?

## 2021-07-09 DIAGNOSIS — Z20822 Contact with and (suspected) exposure to covid-19: Secondary | ICD-10-CM | POA: Diagnosis not present

## 2021-07-16 ENCOUNTER — Ambulatory Visit: Payer: Medicare Other | Admitting: Family Medicine

## 2021-07-18 ENCOUNTER — Ambulatory Visit (INDEPENDENT_AMBULATORY_CARE_PROVIDER_SITE_OTHER): Payer: Medicare Other | Admitting: Family Medicine

## 2021-07-18 ENCOUNTER — Encounter: Payer: Self-pay | Admitting: Family Medicine

## 2021-07-18 VITALS — BP 134/63 | HR 78 | Resp 16 | Wt 161.0 lb

## 2021-07-18 DIAGNOSIS — E119 Type 2 diabetes mellitus without complications: Secondary | ICD-10-CM | POA: Diagnosis not present

## 2021-07-18 DIAGNOSIS — E7800 Pure hypercholesterolemia, unspecified: Secondary | ICD-10-CM

## 2021-07-18 DIAGNOSIS — E1149 Type 2 diabetes mellitus with other diabetic neurological complication: Secondary | ICD-10-CM

## 2021-07-18 DIAGNOSIS — Z8546 Personal history of malignant neoplasm of prostate: Secondary | ICD-10-CM

## 2021-07-18 DIAGNOSIS — I10 Essential (primary) hypertension: Secondary | ICD-10-CM | POA: Diagnosis not present

## 2021-07-18 DIAGNOSIS — K5900 Constipation, unspecified: Secondary | ICD-10-CM | POA: Diagnosis not present

## 2021-07-18 DIAGNOSIS — R634 Abnormal weight loss: Secondary | ICD-10-CM

## 2021-07-18 LAB — POCT GLYCOSYLATED HEMOGLOBIN (HGB A1C)
Est. average glucose Bld gHb Est-mCnc: 266
Hemoglobin A1C: 10.9 % — AB (ref 4.0–5.6)

## 2021-07-18 MED ORDER — METFORMIN HCL 1000 MG PO TABS: 1000.0000 mg | ORAL_TABLET | Freq: Two times a day (BID) | ORAL | 0 refills | Status: DC

## 2021-07-18 NOTE — Patient Instructions (Signed)
TRY VITAMIN D3 AND METAMUCIL DAILY. ?

## 2021-07-18 NOTE — Progress Notes (Signed)
Established patient visit  I,[name],acting as a scribe for [name], MD.,have documented all relevant documentation on the behalf of [name], MD,as directed by  [name], MD while in the presence of [name], MD.   Patient: Michael Murphy   DOB: October 23, 1927   88 y.o. Male  MRN: [id number] Visit Date: 07/18/2021  Today's healthcare provider: [name], MD   Chief Complaint  Patient presents with   Follow-up   Diabetes   Hypertension   Subjective    HPI  Patient comes in today for follow-up.  He is actually feeling fairly well lately.  The issue is gone from every day bowel movements to every other day.  He has lost 10 pounds since last year and I think overall about 20 pounds that was unintentional weight loss.  Again his daughter accompanies him.  He has no complaints and has had no falls. Diabetes Mellitus Type II, follow-up  Lab Results  Component Value Date   HGBA1C 8.8 (A) 01/23/2021   HGBA1C 6.3 (A) 09/23/2020   HGBA1C 6.5 (A) 06/04/2020   Last seen for diabetes 6 months ago.  Management since then includes; After discussion with patient and his daughter will double metformin to '100mg'$  daily. Home blood sugar records: fasting range: 150                                             Most Recent Eye Exam: at [place]  --------------------------------------------------------------------------------------------------- Hypertension, follow-up  BP Readings from Last 3 Encounters:  07/18/21 134/63  01/23/21 (!) 151/81  01/09/21 (!) 154/71   Wt Readings from Last 3 Encounters:  07/18/21 161 lb (73 kg)  01/23/21 189 lb 3.2 oz (85.8 kg)  01/09/21 192 lb (87.1 kg)     He was last seen for hypertension 6 months ago.  Management since that visit includes; good control. Outside blood pressures are  170/42.  --------------------------------------------------------------------------------------------------    Medications: Outpatient Medications Prior to Visit  Medication Sig   albuterol (PROVENTIL HFA;VENTOLIN HFA) 108 (90 Base) MCG/ACT inhaler Inhale 2 puffs into the lungs every 6 (six) hours as needed for wheezing or shortness of breath.   amLODipine (NORVASC) 10 MG tablet Take 10 mg by mouth daily.   aspirin 81 MG chewable tablet Chew by mouth daily.   fluticasone (FLONASE) 50 MCG/ACT nasal spray Place 1 spray into both nostrils as needed.    losartan (COZAAR) 50 MG tablet Take 1 tablet by mouth daily.   LUBRICATING PLUS EYE DROPS 0.5 % SOLN Apply to eye.   meclizine (ANTIVERT) 25 MG tablet Take 1 tablet (25 mg total) by mouth as needed for dizziness.   metFORMIN (GLUCOPHAGE) 1000 MG tablet Take 1 tablet (1,000 mg total) by mouth daily with breakfast.   Multiple Vitamins-Minerals (MENS MULTIVITAMIN PLUS PO) Take by mouth daily.   Omega-3 Fatty Acids (FISH OIL) 1000 MG CAPS Take 1 capsule by mouth daily.   simvastatin (ZOCOR) 20 MG tablet Take 0.5 tablets by mouth at bedtime.   tiotropium (SPIRIVA) 18 MCG inhalation capsule Place 18 mcg into inhaler and inhale every morning.   triamcinolone ointment (KENALOG) 0.1 % APPLY TOPICALLY TO AFFECTED AREAS WITH RASH ONCE TO TWICE DAILY AS NEEDED. AVOID FACE, GROIN, AND AXILLA.   WIXELA INHUB 250-50 MCG/ACT AEPB Inhale 1 puff into the lungs 2 (  two) times daily.   [DISCONTINUED] calcium carbonate (OS-CAL) 600 MG TABS tablet Take 600 mg by mouth daily. (Patient not taking: Reported on 07/18/2021)   [DISCONTINUED] FLAXSEED, LINSEED, PO Take 2 capsules by mouth daily. (Patient not taking: Reported on 01/23/2021)   [DISCONTINUED] fluticasone-salmeterol (ADVAIR) 250-50 MCG/ACT AEPB INHALE 1 PUFF BY ORAL INHALATION TWO TIMES A DAY IN PLACE OF SYMBICORT (Patient not taking: Reported on 07/18/2021)   [DISCONTINUED] mometasone (ELOCON) 0.1 % cream Apply 1  application topically daily. Apply small amount to affected areas (Patient not taking: Reported on 07/18/2021)   [DISCONTINUED] predniSONE (DELTASONE) 5 MG tablet Take as directed (patient given instructions) (Patient not taking: Reported on 07/18/2021)   No facility-administered medications prior to visit.    Review of Systems  Constitutional:  Negative for appetite change, chills and fever.  Respiratory:  Negative for chest tightness, shortness of breath and wheezing.   Cardiovascular:  Negative for chest pain and palpitations.  Gastrointestinal:  Negative for abdominal pain, nausea and vomiting.   Last thyroid functions Lab Results  Component Value Date   TSH 1.390 07/18/2021       Objective    BP 134/63 (BP Location: Left Arm, Patient Position: Sitting, Cuff Size: Normal)   Pulse 78   Resp 16   Wt 161 lb (73 kg)   SpO2 99%   BMI 24.48 kg/m  BP Readings from Last 3 Encounters:  07/18/21 134/63  01/23/21 (!) 151/81  01/09/21 (!) 154/71   Wt Readings from Last 3 Encounters:  07/18/21 161 lb (73 kg)  01/23/21 189 lb 3.2 oz (85.8 kg)  01/09/21 192 lb (87.1 kg)      Physical Exam Vitals reviewed.  Constitutional:      Appearance: He is well-developed.     Comments: He is alert and appears younger than his age of 3.  HENT:     Head: Normocephalic and atraumatic.     Right Ear: External ear normal.     Left Ear: External ear normal.     Nose: Nose normal.  Eyes:     General: No scleral icterus.    Conjunctiva/sclera: Conjunctivae normal.  Neck:     Thyroid: No thyromegaly.  Cardiovascular:     Rate and Rhythm: Normal rate and regular rhythm.     Heart sounds: Normal heart sounds.  Pulmonary:     Effort: Pulmonary effort is normal.     Breath sounds: Normal breath sounds.  Abdominal:     Palpations: Abdomen is soft.  Musculoskeletal:     Comments: Some mild swelling of the the overall left knee but mainly has a small Baker's cyst. No effusion appreciated.   Skin:    General: Skin is warm and dry.     Comments: Hyperpigmentation of the right lower extremity on rash is much improved.  Certainly there is no secondary infection or skin breakdown at all.  Neurological:     General: No focal deficit present.     Mental Status: He is alert and oriented to person, place, and time.     Sensory: No sensory deficit.     Motor: No weakness.     Coordination: Coordination normal.  Psychiatric:        Mood and Affect: Mood normal.        Behavior: Behavior normal.        Thought Content: Thought content normal.        Judgment: Judgment normal.      No results found for  any visits on 07/18/21.  Assessment & Plan     1. Type 2 diabetes mellitus without complication, without long-term current use of insulin (HCC) 1C is gone from 8.8-10.66 in this 88 year old.  Her discussion with he and his daughter we will increase metformin to 1000 mg twice daily with meals. - CBC w/Diff/Platelet - Comprehensive Metabolic Panel (CMET) - TSH - Lipid panel - metFORMIN (GLUCOPHAGE) 1000 MG tablet; Take 1 tablet (1,000 mg total) by mouth 2 (two) times daily with a meal.  Dispense: 180 tablet; Refill: 0 - POCT glycosylated hemoglobin (Hb A1C)  2. Benign essential HTN Good blood pressure control. - CBC w/Diff/Platelet - Comprehensive Metabolic Panel (CMET) - TSH - Lipid panel  3. Hypercholesterolemia without hypertriglyceridemia  - CBC w/Diff/Platelet - Comprehensive Metabolic Panel (CMET) - TSH - Lipid panel  4. Weight loss This could be problematic or it could be normal aging.  Patient does not eat as much is as much as he used to.  At this time will follow clinically and patient and daughter agreed to to this. - CBC w/Diff/Platelet - Comprehensive Metabolic Panel (CMET) - TSH - Lipid panel  5. Constipation, unspecified constipation type Try Metamucil daily - CBC w/Diff/Platelet - Comprehensive Metabolic Panel (CMET) - TSH - Lipid panel  6.  History of prostate cancer   7. Other diabetic neurological complication associated with type 2 diabetes mellitus ([place])    No follow-ups on file.      I, [name], MD, have reviewed all documentation for this visit. The documentation on 07/24/21 for the exam, diagnosis, procedures, and orders are all accurate and complete.    Jkai Arwood Cranford Mon, MD  [facility] [phone] (phone) [phone] (fax)  [place]

## 2021-07-19 LAB — CBC WITH DIFFERENTIAL/PLATELET
Basophils Absolute: 0 x10E3/uL (ref 0.0–0.2)
Basos: 0 %
EOS (ABSOLUTE): 0.1 x10E3/uL (ref 0.0–0.4)
Eos: 1 %
Hematocrit: 40.9 % (ref 37.5–51.0)
Hemoglobin: 13.6 g/dL (ref 13.0–17.7)
Immature Grans (Abs): 0 x10E3/uL (ref 0.0–0.1)
Immature Granulocytes: 0 %
Lymphocytes Absolute: 2.1 x10E3/uL (ref 0.7–3.1)
Lymphs: 25 %
MCH: 28.4 pg (ref 26.6–33.0)
MCHC: 33.3 g/dL (ref 31.5–35.7)
MCV: 85 fL (ref 79–97)
Monocytes Absolute: 0.6 x10E3/uL (ref 0.1–0.9)
Monocytes: 7 %
Neutrophils Absolute: 5.5 x10E3/uL (ref 1.4–7.0)
Neutrophils: 67 %
Platelets: 180 x10E3/uL (ref 150–450)
RBC: 4.79 x10E6/uL (ref 4.14–5.80)
RDW: 13.2 % (ref 11.6–15.4)
WBC: 8.3 x10E3/uL (ref 3.4–10.8)

## 2021-07-19 LAB — COMPREHENSIVE METABOLIC PANEL
ALT: 13 IU/L (ref 0–44)
AST: 16 IU/L (ref 0–40)
Albumin/Globulin Ratio: 1.9 (ref 1.2–2.2)
Albumin: 4.6 g/dL (ref 3.5–4.6)
Alkaline Phosphatase: 70 IU/L (ref 44–121)
BUN/Creatinine Ratio: 9 — ABNORMAL LOW (ref 10–24)
BUN: 7 mg/dL — ABNORMAL LOW (ref 10–36)
Bilirubin Total: 0.8 mg/dL (ref 0.0–1.2)
CO2: 22 mmol/L (ref 20–29)
Calcium: 10.4 mg/dL — ABNORMAL HIGH (ref 8.6–10.2)
Chloride: 103 mmol/L (ref 96–106)
Creatinine, Ser: 0.82 mg/dL (ref 0.76–1.27)
Globulin, Total: 2.4 g/dL (ref 1.5–4.5)
Glucose: 266 mg/dL — ABNORMAL HIGH (ref 70–99)
Potassium: 4.4 mmol/L (ref 3.5–5.2)
Sodium: 142 mmol/L (ref 134–144)
Total Protein: 7 g/dL (ref 6.0–8.5)
eGFR: 83 mL/min/{1.73_m2} (ref >59)

## 2021-07-19 LAB — LIPID PANEL
Chol/HDL Ratio: 1.9 ratio (ref 0.0–5.0)
Cholesterol, Total: 168 mg/dL (ref 100–199)
HDL: 87 mg/dL (ref >39)
LDL Chol Calc (NIH): 70 mg/dL (ref 0–99)
Triglycerides: 53 mg/dL (ref 0–149)
VLDL Cholesterol Cal: 11 mg/dL (ref 5–40)

## 2021-07-19 LAB — TSH: TSH: 1.39 u[IU]/mL (ref 0.450–4.500)

## 2021-08-25 ENCOUNTER — Telehealth: Payer: Self-pay | Admitting: Family Medicine

## 2021-08-25 NOTE — Telephone Encounter (Signed)
Copied from [place] [id number]. Topic: Medicare AWV >> Aug 25, 2021  2:28 PM Jae Dire wrote: Reason for CRM:  No answer unable to leave a message for patient to call back and schedule Medicare Annual Wellness Visit (AWV) in office.   If unable to come into the office for AWV,  please offer to do virtually or by telephone.  Last AWV: 08/31/2019  Please schedule at anytime with [facility] Health Advisor.  30 minute appointment for Virtual or phone 45 minute appointment for in office or Initial virtual/phone  Any questions, please contact me at [phone]

## 2021-08-27 ENCOUNTER — Encounter: Payer: Self-pay | Admitting: Family Medicine

## 2021-08-27 ENCOUNTER — Ambulatory Visit (INDEPENDENT_AMBULATORY_CARE_PROVIDER_SITE_OTHER): Payer: Medicare Other | Admitting: Family Medicine

## 2021-08-27 ENCOUNTER — Ambulatory Visit: Payer: Self-pay | Admitting: *Deleted

## 2021-08-27 VITALS — BP 112/61 | HR 84 | Resp 16 | Ht 68.0 in | Wt 155.0 lb

## 2021-08-27 DIAGNOSIS — R140 Abdominal distension (gaseous): Secondary | ICD-10-CM

## 2021-08-27 DIAGNOSIS — N5231 Erectile dysfunction following radical prostatectomy: Secondary | ICD-10-CM

## 2021-08-27 DIAGNOSIS — K219 Gastro-esophageal reflux disease without esophagitis: Secondary | ICD-10-CM

## 2021-08-27 DIAGNOSIS — E1149 Type 2 diabetes mellitus with other diabetic neurological complication: Secondary | ICD-10-CM

## 2021-08-27 DIAGNOSIS — R634 Abnormal weight loss: Secondary | ICD-10-CM | POA: Diagnosis not present

## 2021-08-27 DIAGNOSIS — I10 Essential (primary) hypertension: Secondary | ICD-10-CM | POA: Diagnosis not present

## 2021-08-27 DIAGNOSIS — R1011 Right upper quadrant pain: Secondary | ICD-10-CM

## 2021-08-27 DIAGNOSIS — Z8546 Personal history of malignant neoplasm of prostate: Secondary | ICD-10-CM

## 2021-08-27 DIAGNOSIS — E119 Type 2 diabetes mellitus without complications: Secondary | ICD-10-CM | POA: Diagnosis not present

## 2021-08-27 MED ORDER — SUCRALFATE 1 G PO TABS: 1.0000 g | ORAL_TABLET | Freq: Three times a day (TID) | ORAL | 5 refills | Status: AC

## 2021-08-27 MED ORDER — OMEPRAZOLE 20 MG PO CPDR: 20.0000 mg | DELAYED_RELEASE_CAPSULE | Freq: Every day | ORAL | 5 refills | Status: AC

## 2021-08-27 NOTE — Progress Notes (Unsigned)
Established patient visit  I,Michaela Broski,acting as a scribe for [name], MD.,have documented all relevant documentation on the behalf of [name], MD,as directed by  [name], MD while in the presence of [name], MD.   Patient: Michael Murphy   DOB: 02-03-1928   88 y.o. Male  MRN: [id number] Visit Date: 08/27/2021  Today's healthcare provider: [name], MD   Chief Complaint  Patient presents with   Abdominal Pain   Subjective    HPI  Patient states his stomach has been upset for around 1 week. Patient states he took Pepto Bismol yesterday and today. He did have some relief. Patient states he feels bloated and has been belching a lot. Patient states he does not feel pain, just discomfort. Patient has been having normal bowel movements. Patient also states he has not had an appetite.   Medications: Outpatient Medications Prior to Visit  Medication Sig   albuterol (PROVENTIL HFA;VENTOLIN HFA) 108 (90 Base) MCG/ACT inhaler Inhale 2 puffs into the lungs every 6 (six) hours as needed for wheezing or shortness of breath.   amLODipine (NORVASC) 10 MG tablet Take 10 mg by mouth daily.   aspirin 81 MG chewable tablet Chew by mouth daily.   Coenzyme Q10 (CO Q 10 PO) Take by mouth.   fluticasone (FLONASE) 50 MCG/ACT nasal spray Place 1 spray into both nostrils as needed.    losartan (COZAAR) 50 MG tablet Take 1 tablet by mouth daily.   LUBRICATING PLUS EYE DROPS 0.5 % SOLN Apply to eye.   meclizine (ANTIVERT) 25 MG tablet Take 1 tablet (25 mg total) by mouth as needed for dizziness.   metFORMIN (GLUCOPHAGE) 1000 MG tablet Take 1 tablet (1,000 mg total) by mouth 2 (two) times daily with a meal.   Multiple Vitamins-Minerals (MENS MULTIVITAMIN PLUS PO) Take by mouth daily.   simvastatin (ZOCOR) 20 MG tablet Take 0.5 tablets by mouth at bedtime.   tiotropium (SPIRIVA) 18 MCG inhalation capsule Place 18 mcg into inhaler and inhale every  morning.   triamcinolone ointment (KENALOG) 0.1 % APPLY TOPICALLY TO AFFECTED AREAS WITH RASH ONCE TO TWICE DAILY AS NEEDED. AVOID FACE, GROIN, AND AXILLA.   WIXELA INHUB 250-50 MCG/ACT AEPB Inhale 1 puff into the lungs 2 (two) times daily.   [DISCONTINUED] Omega-3 Fatty Acids (FISH OIL) 1000 MG CAPS Take 1 capsule by mouth daily. (Patient not taking: Reported on 08/27/2021)   No facility-administered medications prior to visit.    Review of Systems  {Labs  Heme  Chem  Endocrine  Serology  Results Review (optional):23779}   Objective    BP 112/61 (BP Location: Left Arm, Patient Position: Sitting, Cuff Size: Normal)   Pulse 84   Resp 16   Ht '5\' 8"'$  (1.727 m)   Wt 155 lb (70.3 kg)   SpO2 100%   BMI 23.57 kg/m  {Show previous vital signs (optional):23777}  Physical Exam  ***  No results found for any visits on 08/27/21.  Assessment & Plan     1. Right upper quadrant abdominal pain  - CBC w/Diff/Platelet - Comprehensive Metabolic Panel (CMET) - Lipase - US Abdomen Limited RUQ (LIVER/GB); Future  2. Gastroesophageal reflux disease without esophagitis   3. Abdominal bloating  - US Abdomen Limited RUQ (LIVER/GB); Future  4. Weight loss  - CBC w/Diff/Platelet - Comprehensive Metabolic Panel (CMET) - Lipase - US Abdomen Limited RUQ (LIVER/GB); Future   Return in about 4  weeks (around 09/24/2021).      {provider attestation***:1}   [name], MD  [facility] [phone] (phone) [phone] (fax)  [place]

## 2021-08-27 NOTE — Telephone Encounter (Signed)
  Chief Complaint: stomach feels raw inside and severe pain yesterday requesting appt  Symptoms: decrease pain today after taking pepto bismol. C/o worsening pain sudden yesterday radiates to upper abdominal area LBM today .  Frequency: 1 week  Pertinent Negatives: Patient denies chest pain, no fever, no N/V/D  Disposition: '[]'$ ED /'[]'$ Urgent Care (no appt availability in office) / '[x]'$ Appointment(In office/virtual)/ '[]'$  [place] [facility]/ '[]'$ Home Care/ '[]'$ Refused Recommended Disposition /'[]'$ [place] [facility]/ '[]'$  Follow-up with PCP Additional Notes:   Na    Reason for Disposition  [1] MODERATE pain (e.g., interferes with normal activities) AND [2] pain comes and goes (cramps) AND [3] present > 24 hours  (Exception: pain with Vomiting or Diarrhea - see that Guideline)  Answer Assessment - Initial Assessment Questions 1. LOCATION: "Where does it hurt?"      Abdominal area 2. RADIATION: "Does the pain shoot anywhere else?" (e.g., chest, back)     Upper stomach  3. ONSET: "When did the pain begin?" (Minutes, hours or days ago)      1 week 4. SUDDEN: "Gradual or sudden onset?"     Yesterday sudden onset  5. PATTERN "Does the pain come and go, or is it constant?"    - If constant: "Is it getting better, staying the same, or worsening?"      (Note: Constant means the pain never goes away completely; most serious pain is constant and it progresses)     - If intermittent: "How long does it last?" "Do you have pain now?"     (Note: Intermittent means the pain goes away completely between bouts)     Comes and goes  6. SEVERITY: "How bad is the pain?"  (e.g., Scale 1-10; mild, moderate, or severe)    - MILD (1-3): doesn't interfere with normal activities, abdomen soft and not tender to touch     - MODERATE (4-7): interferes with normal activities or awakens from sleep, abdomen tender to touch     - SEVERE (8-10): excruciating pain, doubled over, unable to do any normal activities       Moderate   7. RECURRENT SYMPTOM: "Have you ever had this type of stomach pain before?" If Yes, ask: "When was the last time?" and "What happened that time?"      No  8. CAUSE: "What do you think is causing the stomach pain?"     No  9. RELIEVING/AGGRAVATING FACTORS: "What makes it better or worse?" (e.g., movement, antacids, bowel movement)     Pepto bismol better  10. OTHER SYMPTOMS: "Do you have any other symptoms?" (e.g., back pain, diarrhea, fever, urination pain, vomiting)       Abdominal pain  Protocols used: Abdominal Pain - Male-A-AH

## 2021-08-28 LAB — CBC WITH DIFFERENTIAL/PLATELET
Basophils Absolute: 0 10*3/uL (ref 0.0–0.2)
Basos: 0 %
EOS (ABSOLUTE): 0.2 10*3/uL (ref 0.0–0.4)
Eos: 2 %
Hematocrit: 40.5 % (ref 37.5–51.0)
Hemoglobin: 13.5 g/dL (ref 13.0–17.7)
Immature Grans (Abs): 0 10*3/uL (ref 0.0–0.1)
Immature Granulocytes: 0 %
Lymphocytes Absolute: 1.9 10*3/uL (ref 0.7–3.1)
Lymphs: 21 %
MCH: 28.7 pg (ref 26.6–33.0)
MCHC: 33.3 g/dL (ref 31.5–35.7)
MCV: 86 fL (ref 79–97)
Monocytes Absolute: 0.9 10*3/uL (ref 0.1–0.9)
Monocytes: 10 %
Neutrophils Absolute: 6.1 10*3/uL (ref 1.4–7.0)
Neutrophils: 67 %
Platelets: 188 10*3/uL (ref 150–450)
RBC: 4.7 x10E6/uL (ref 4.14–5.80)
RDW: 13.1 % (ref 11.6–15.4)
WBC: 9.1 10*3/uL (ref 3.4–10.8)

## 2021-08-28 LAB — COMPREHENSIVE METABOLIC PANEL
ALT: 216 IU/L — ABNORMAL HIGH (ref 0–44)
AST: 196 IU/L — ABNORMAL HIGH (ref 0–40)
Albumin/Globulin Ratio: 1.8 (ref 1.2–2.2)
Albumin: 4.8 g/dL — ABNORMAL HIGH (ref 3.5–4.6)
Alkaline Phosphatase: 342 IU/L — ABNORMAL HIGH (ref 44–121)
BUN/Creatinine Ratio: 10 (ref 10–24)
BUN: 7 mg/dL — ABNORMAL LOW (ref 10–36)
Bilirubin Total: 1.4 mg/dL — ABNORMAL HIGH (ref 0.0–1.2)
CO2: 20 mmol/L (ref 20–29)
Calcium: 9.8 mg/dL (ref 8.6–10.2)
Chloride: 103 mmol/L (ref 96–106)
Creatinine, Ser: 0.72 mg/dL — ABNORMAL LOW (ref 0.76–1.27)
Globulin, Total: 2.6 g/dL (ref 1.5–4.5)
Glucose: 139 mg/dL — ABNORMAL HIGH (ref 70–99)
Potassium: 4.3 mmol/L (ref 3.5–5.2)
Sodium: 142 mmol/L (ref 134–144)
Total Protein: 7.4 g/dL (ref 6.0–8.5)
eGFR: 86 mL/min/{1.73_m2} (ref >59)

## 2021-08-28 LAB — LIPASE: Lipase: 18 U/L (ref 13–78)

## 2021-09-01 ENCOUNTER — Other Ambulatory Visit: Payer: Self-pay | Admitting: *Deleted

## 2021-09-01 ENCOUNTER — Ambulatory Visit
Admission: RE | Admit: 2021-09-01 | Discharge: 2021-09-01 | Disposition: A | Discharge status: Home / Self Care | Payer: Medicare Other | Source: Ambulatory Visit | Attending: Family Medicine | Admitting: Family Medicine

## 2021-09-01 DIAGNOSIS — R140 Abdominal distension (gaseous): Secondary | ICD-10-CM | POA: Diagnosis not present

## 2021-09-01 DIAGNOSIS — R634 Abnormal weight loss: Secondary | ICD-10-CM | POA: Insufficient documentation

## 2021-09-01 DIAGNOSIS — R109 Unspecified abdominal pain: Secondary | ICD-10-CM | POA: Diagnosis not present

## 2021-09-01 DIAGNOSIS — K828 Other specified diseases of gallbladder: Secondary | ICD-10-CM

## 2021-09-01 DIAGNOSIS — R1011 Right upper quadrant pain: Secondary | ICD-10-CM | POA: Insufficient documentation

## 2021-09-01 DIAGNOSIS — K824 Cholesterolosis of gallbladder: Secondary | ICD-10-CM | POA: Diagnosis not present

## 2021-09-02 ENCOUNTER — Encounter: Payer: Self-pay | Admitting: Surgery

## 2021-09-02 ENCOUNTER — Ambulatory Visit (INDEPENDENT_AMBULATORY_CARE_PROVIDER_SITE_OTHER): Payer: Medicare Other | Admitting: Surgery

## 2021-09-02 VITALS — BP 149/71 | HR 105 | Temp 98.5°F | Ht 68.0 in | Wt 155.2 lb

## 2021-09-02 DIAGNOSIS — R1011 Right upper quadrant pain: Secondary | ICD-10-CM

## 2021-09-02 DIAGNOSIS — R630 Anorexia: Secondary | ICD-10-CM

## 2021-09-02 DIAGNOSIS — K819 Cholecystitis, unspecified: Secondary | ICD-10-CM | POA: Insufficient documentation

## 2021-09-17 ENCOUNTER — Other Ambulatory Visit: Payer: Self-pay | Admitting: Surgery

## 2021-09-17 ENCOUNTER — Encounter: Payer: Self-pay | Admitting: Gastroenterology

## 2021-09-17 ENCOUNTER — Ambulatory Visit: Payer: Medicare Other | Admitting: Family Medicine

## 2021-09-17 ENCOUNTER — Other Ambulatory Visit: Payer: Self-pay

## 2021-09-17 ENCOUNTER — Ambulatory Visit (INDEPENDENT_AMBULATORY_CARE_PROVIDER_SITE_OTHER): Payer: Medicare Other | Admitting: Gastroenterology

## 2021-09-17 ENCOUNTER — Encounter
Admission: RE | Admit: 2021-09-17 | Discharge: 2021-09-17 | Disposition: A | Discharge status: Home / Self Care | Payer: Medicare Other | Source: Ambulatory Visit | Attending: Surgery | Admitting: Surgery

## 2021-09-17 VITALS — BP 125/75 | HR 91 | Temp 97.9°F | Wt 150.0 lb

## 2021-09-17 DIAGNOSIS — R748 Abnormal levels of other serum enzymes: Secondary | ICD-10-CM

## 2021-09-17 DIAGNOSIS — K819 Cholecystitis, unspecified: Secondary | ICD-10-CM

## 2021-09-17 MED ORDER — TECHNETIUM TC 99M MEBROFENIN IV KIT
5.3000 | PACK | Freq: Once | INTRAVENOUS | Status: AC | PRN
  Administered 2021-09-17: 5.3 via INTRAVENOUS

## 2021-09-17 NOTE — H&P (View-Only) (Signed)
Gastroenterology Consultation  Referring Provider:     Jerrol Banana.,* Primary Care Physician:  Jerrol Banana., MD Primary Gastroenterologist:  Dr. Allen Norris     Reason for Consultation:     Abdominal pain        HPI:   Michael Murphy is a 88 y.o. y/o male referred for consultation & management of abdominal pain by Dr. Rosanna Randy, Retia Passe., MD. This patient had been seen by his primary care provider back in June and at that time was reporting some right upper quadrant pain that radiated to the flank.  The patient had labs done and was sent to evaluation by surgery.  The patient had an ultrasound that showed sludge in the gallbladder but no sign of acute cholecystitis.  The patient's blood work showed:  Component     Latest Ref Rng 09/23/2020 07/18/2021 08/27/2021  Total Bilirubin     0.0 - 1.2 mg/dL 0.6  0.8  1.4 (H)   Alkaline Phosphatase     44 - 121 IU/L 61  70  342 (H)   AST     0 - 40 IU/L 15  16  196 (H)   ALT     0 - 44 IU/L 14  13  216 (H)     The possibility of doing a cholecystectomy was discussed with the family and they wanted to have the patient evaluated by GI before proceeding with any surgery.  The patient has also been set up for a HIDA scan which appears to be scheduled for 11:00 today.  Which is after my appoint with him. The patient reports that he has been getting more jaundiced and his urine has been darker since his last blood work.  He denies any abdominal pain nausea vomiting fevers or chills.  The patient does report some dysphagia which she states is to both liquids and solids.  The patient has lost some weight and states that it is mainly due to loss of appetite and just not being hungry opposed to not being able to swallow.  Past Medical History:  Diagnosis Date   Adiposity 07/12/2014   Allergic rhinitis 02/11/2007   Arthritis, degenerative 02/11/2007   Benign essential HTN 02/11/2007   Goal BP< 130/80 due DMII.    CA of prostate ([place]) 07/12/2014    Carpal tunnel syndrome 07/12/2014   Diabetes mellitus without complication ([place])    Diabetic neuropathy ([place]) 02/12/2009   s/p Lifestyle Education Diabetic Classes 2012.  Monofilament intact 09/18/2013-normal bilaterally.    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    OP (osteoporosis) 02/11/2007    Past Surgical History:  Procedure Laterality Date   CATARACT EXTRACTION     KNEE ARTHROSCOPY     left   MULTIPLE TOOTH EXTRACTIONS     PROSTATECTOMY     ROTATOR CUFF REPAIR      Prior to Admission medications   Medication Sig Start Date End Date Taking? Authorizing Provider  albuterol (PROVENTIL HFA;VENTOLIN HFA) 108 (90 Base) MCG/ACT inhaler Inhale 2 puffs into the lungs every 6 (six) hours as needed for wheezing or shortness of breath. 03/11/17   Jerrol Banana., MD  amLODipine (NORVASC) 10 MG tablet Take 10 mg by mouth daily. 10/15/10   [provider]  aspirin 81 MG chewable tablet Chew by mouth daily.    [provider]  Coenzyme Q10 (CO Q 10 PO) Take by mouth.    [provider]  fluticasone (FLONASE) 50 MCG/ACT nasal spray Place 1 spray into both nostrils as needed.  05/16/18   [provider]  losartan (COZAAR) 50 MG tablet Take 1 tablet by mouth daily. 02/17/21   [provider]  LUBRICATING PLUS EYE DROPS 0.5 % SOLN Apply to eye. 01/03/20   [provider]  meclizine (ANTIVERT) 25 MG tablet Take 1 tablet (25 mg total) by mouth as needed for dizziness. 12/06/19   Jerrol Banana., MD  metFORMIN (GLUCOPHAGE) 1000 MG tablet Take 1 tablet (1,000 mg total) by mouth 2 (two) times daily with a meal. 07/18/21   Jerrol Banana., MD  Multiple Vitamins-Minerals (MENS MULTIVITAMIN PLUS PO) Take by mouth  daily.    [provider]  omeprazole (PRILOSEC) 20 MG capsule Take 1 capsule (20 mg total) by mouth daily. 08/27/21   Jerrol Banana., MD  simvastatin (ZOCOR) 20 MG tablet Take 0.5 tablets by mouth at bedtime.    [provider]  sucralfate (CARAFATE) 1 g tablet Take 1 tablet (1 g total) by mouth 4 (four) times daily -  with meals and at bedtime. 08/27/21   Jerrol Banana., MD  tiotropium (SPIRIVA) 18 MCG inhalation capsule Place 18 mcg into inhaler and inhale every morning.    [provider]  triamcinolone ointment (KENALOG) 0.1 % APPLY TOPICALLY TO AFFECTED AREAS WITH RASH ONCE TO TWICE DAILY AS NEEDED. AVOID FACE, GROIN, AND AXILLA. 06/18/21   Moye, [place], MD  WIXELA INHUB 250-50 MCG/ACT AEPB Inhale 1 puff into the lungs 2 (two) times daily. 07/02/20   [provider]    Family History  Problem Relation Age of Onset   Hypertension Mother    Stroke Mother    Diabetes Sister    Hypertension Sister    Diabetes Brother    Diabetes Brother      Social History   Tobacco Use   Smoking status: Former    Packs/day: 0.50    Years: 18.00    Total pack years: 9.00    Types: Cigarettes    Quit date: 03/09/1991    Years since quitting: 30.5   Smokeless tobacco: Never  Vaping Use   Vaping Use: Never used  Substance Use Topics   Alcohol use: Yes    Alcohol/week: 1.0 - 3.0 standard drink of alcohol    Types: 1 - 3 Cans of beer per week   Drug use: No    Allergies as of 09/17/2021 - Review Complete 09/02/2021  Allergen Reaction Noted   Accupril [quinapril hcl] Anaphylaxis 07/12/2014   Quinapril Swelling 01/23/2013   Flunisolide  11/05/2004   Diclofenac Rash 02/17/2021    Review of Systems:    All systems reviewed and negative except where noted in HPI.   Physical Exam:  There were no vitals taken for this visit. No LMP for male patient. General:   Alert,  Well-developed, well-nourished, pleasant and cooperative in NAD Head:   Normocephalic and atraumatic. Eyes:  Sclera clear, no icterus.   Conjunctiva yellow. Ears:  Normal auditory acuity. Neck:  Supple; no masses or thyromegaly. Lungs:  Respirations even and unlabored.  Clear throughout to auscultation.   No wheezes, crackles, or rhonchi. No acute distress. Heart:  Regular rate and rhythm; no murmurs, clicks, rubs, or gallops. Abdomen:  Normal bowel sounds.  No bruits.  Soft, non-tender and non-distended without masses, hepatosplenomegaly or hernias noted.  No guarding or rebound tenderness.  Negative Carnett sign.   Rectal:  Deferred.  Pulses:  Normal pulses noted. Extremities:  No clubbing or edema.  No cyanosis. Neurologic:  Alert and oriented x3;  grossly normal neurologically. Skin:  Intact without significant lesions or rashes.  Positive jaundice. Lymph Nodes:  No significant cervical adenopathy. Psych:  Alert and cooperative. Normal mood and affect.  Imaging Studies: US Abdomen Limited RUQ (LIVER/GB)  Result Date: 09/01/2021 CLINICAL DATA:  Abdominal pain for 1.5 weeks EXAM: ULTRASOUND ABDOMEN LIMITED RIGHT UPPER QUADRANT COMPARISON:  Abdomen x-ray May 22, 2011 FINDINGS: Gallbladder: No wall thickening visualized. Sludge is noted in the gallbladder. No sonographic Murphy sign noted by sonographer. Common bile duct: Diameter: 7 mm Liver: No focal lesion identified. Within normal limits in parenchymal echogenicity. Portal vein is patent on color Doppler imaging with normal direction of blood flow towards the liver. Other: None. IMPRESSION: Sludge in the gallbladder. No sonographic evidence of acute cholecystitis. Electronically Signed   By: Abelardo Diesel M.D.   On: 09/01/2021 08:47    Assessment and Plan:   Michael Murphy is a 88 y.o. y/o male who comes in today with a ultrasound showing gallbladder sludge and a history of right upper quadrant pain with abnormal liver enzymes.  The patient will have his liver enzymes checked again today and if elevated he has  been told that then there is a real concern for some biliary obstruction.  The patient also has a HIDA scan scheduled for today and we will wait to see those results.  The patient may need an ERCP or MRCP if obstruction of the common bile duct is suspected.  The patient has been told of the risks associated with his advanced age of 53 years.  The patient has been explained the plan and agrees with it.    Lucilla Lame, MD. [facility]    Note: This dictation was prepared with Dragon dictation along with smaller phrase technology. Any transcriptional errors that result from this process are unintentional.

## 2021-09-17 NOTE — Progress Notes (Signed)
Gastroenterology Consultation  Referring Provider:     Jerrol Banana.,* Primary Care Physician:  Jerrol Banana., MD Primary Gastroenterologist:  Dr. Allen Norris     Reason for Consultation:     Abdominal pain        HPI:   Michael Murphy is a 88 y.o. y/o male referred for consultation & management of abdominal pain by Dr. Rosanna Randy, Retia Passe., MD. This patient had been seen by his primary care provider back in June and at that time was reporting some right upper quadrant pain that radiated to the flank.  The patient had labs done and was sent to evaluation by surgery.  The patient had an ultrasound that showed sludge in the gallbladder but no sign of acute cholecystitis.  The patient's blood work showed:  Component     Latest Ref Rng 09/23/2020 07/18/2021 08/27/2021  Total Bilirubin     0.0 - 1.2 mg/dL 0.6  0.8  1.4 (H)   Alkaline Phosphatase     44 - 121 IU/L 61  70  342 (H)   AST     0 - 40 IU/L 15  16  196 (H)   ALT     0 - 44 IU/L 14  13  216 (H)     The possibility of doing a cholecystectomy was discussed with the family and they wanted to have the patient evaluated by GI before proceeding with any surgery.  The patient has also been set up for a HIDA scan which appears to be scheduled for 11:00 today.  Which is after my appoint with him. The patient reports that he has been getting more jaundiced and his urine has been darker since his last blood work.  He denies any abdominal pain nausea vomiting fevers or chills.  The patient does report some dysphagia which she states is to both liquids and solids.  The patient has lost some weight and states that it is mainly due to loss of appetite and just not being hungry opposed to not being able to swallow.  Past Medical History:  Diagnosis Date   Adiposity 07/12/2014   Allergic rhinitis 02/11/2007   Arthritis, degenerative 02/11/2007   Benign essential HTN 02/11/2007   Goal BP< 130/80 due DMII.    CA of prostate ([place]) 07/12/2014    Carpal tunnel syndrome 07/12/2014   Diabetes mellitus without complication ([place])    Diabetic neuropathy ([place]) 02/12/2009   s/p Lifestyle Education Diabetic Classes 2012.  Monofilament intact 09/18/2013-normal bilaterally.    ED (erectile dysfunction) of organic origin 01/23/2013   Epididymitis 07/12/2014   Excessive urination at night 01/23/2013   Hypercholesterolemia without hypertriglyceridemia 02/11/2007   Hypertension    Neurosis, posttraumatic 07/12/2014   ([place] [facility]) and ([place]).    OP (osteoporosis) 02/11/2007    Past Surgical History:  Procedure Laterality Date   CATARACT EXTRACTION     KNEE ARTHROSCOPY     left   MULTIPLE TOOTH EXTRACTIONS     PROSTATECTOMY     ROTATOR CUFF REPAIR      Prior to Admission medications   Medication Sig Start Date End Date Taking? Authorizing Provider  albuterol (PROVENTIL HFA;VENTOLIN HFA) 108 (90 Base) MCG/ACT inhaler Inhale 2 puffs into the lungs every 6 (six) hours as needed for wheezing or shortness of breath. 03/11/17   Jerrol Banana., MD  amLODipine (NORVASC) 10 MG tablet Take 10 mg by mouth daily. 10/15/10   [provider]  aspirin 81 MG chewable tablet Chew by mouth daily.    [provider]  Coenzyme Q10 (CO Q 10 PO) Take by mouth.    [provider]  fluticasone (FLONASE) 50 MCG/ACT nasal spray Place 1 spray into both nostrils as needed.  05/16/18   [provider]  losartan (COZAAR) 50 MG tablet Take 1 tablet by mouth daily. 02/17/21   [provider]  LUBRICATING PLUS EYE DROPS 0.5 % SOLN Apply to eye. 01/03/20   [provider]  meclizine (ANTIVERT) 25 MG tablet Take 1 tablet (25 mg total) by mouth as needed for dizziness. 12/06/19   Jerrol Banana., MD  metFORMIN (GLUCOPHAGE) 1000 MG tablet Take 1 tablet (1,000 mg total) by mouth 2 (two) times daily with a meal. 07/18/21   Jerrol Banana., MD  Multiple Vitamins-Minerals (MENS MULTIVITAMIN PLUS PO) Take by mouth  daily.    [provider]  omeprazole (PRILOSEC) 20 MG capsule Take 1 capsule (20 mg total) by mouth daily. 08/27/21   Jerrol Banana., MD  simvastatin (ZOCOR) 20 MG tablet Take 0.5 tablets by mouth at bedtime.    [provider]  sucralfate (CARAFATE) 1 g tablet Take 1 tablet (1 g total) by mouth 4 (four) times daily -  with meals and at bedtime. 08/27/21   Jerrol Banana., MD  tiotropium (SPIRIVA) 18 MCG inhalation capsule Place 18 mcg into inhaler and inhale every morning.    [provider]  triamcinolone ointment (KENALOG) 0.1 % APPLY TOPICALLY TO AFFECTED AREAS WITH RASH ONCE TO TWICE DAILY AS NEEDED. AVOID FACE, GROIN, AND AXILLA. 06/18/21   Moye, [place], MD  WIXELA INHUB 250-50 MCG/ACT AEPB Inhale 1 puff into the lungs 2 (two) times daily. 07/02/20   [provider]    Family History  Problem Relation Age of Onset   Hypertension Mother    Stroke Mother    Diabetes Sister    Hypertension Sister    Diabetes Brother    Diabetes Brother      Social History   Tobacco Use   Smoking status: Former    Packs/day: 0.50    Years: 18.00    Total pack years: 9.00    Types: Cigarettes    Quit date: 03/09/1991    Years since quitting: 30.5   Smokeless tobacco: Never  Vaping Use   Vaping Use: Never used  Substance Use Topics   Alcohol use: Yes    Alcohol/week: 1.0 - 3.0 standard drink of alcohol    Types: 1 - 3 Cans of beer per week   Drug use: No    Allergies as of 09/17/2021 - Review Complete 09/02/2021  Allergen Reaction Noted   Accupril [quinapril hcl] Anaphylaxis 07/12/2014   Quinapril Swelling 01/23/2013   Flunisolide  11/05/2004   Diclofenac Rash 02/17/2021    Review of Systems:    All systems reviewed and negative except where noted in HPI.   Physical Exam:  There were no vitals taken for this visit. No LMP for male patient. General:   Alert,  Well-developed, well-nourished, pleasant and cooperative in NAD Head:   Normocephalic and atraumatic. Eyes:  Sclera clear, no icterus.   Conjunctiva yellow. Ears:  Normal auditory acuity. Neck:  Supple; no masses or thyromegaly. Lungs:  Respirations even and unlabored.  Clear throughout to auscultation.   No wheezes, crackles, or rhonchi. No acute distress. Heart:  Regular rate and rhythm; no murmurs, clicks, rubs, or gallops. Abdomen:  Normal bowel sounds.  No bruits.  Soft, non-tender and non-distended without masses, hepatosplenomegaly or hernias noted.  No guarding or rebound tenderness.  Negative Carnett sign.   Rectal:  Deferred.  Pulses:  Normal pulses noted. Extremities:  No clubbing or edema.  No cyanosis. Neurologic:  Alert and oriented x3;  grossly normal neurologically. Skin:  Intact without significant lesions or rashes.  Positive jaundice. Lymph Nodes:  No significant cervical adenopathy. Psych:  Alert and cooperative. Normal mood and affect.  Imaging Studies: US Abdomen Limited RUQ (LIVER/GB)  Result Date: 09/01/2021 CLINICAL DATA:  Abdominal pain for 1.5 weeks EXAM: ULTRASOUND ABDOMEN LIMITED RIGHT UPPER QUADRANT COMPARISON:  Abdomen x-ray May 22, 2011 FINDINGS: Gallbladder: No wall thickening visualized. Sludge is noted in the gallbladder. No sonographic Murphy sign noted by sonographer. Common bile duct: Diameter: 7 mm Liver: No focal lesion identified. Within normal limits in parenchymal echogenicity. Portal vein is patent on color Doppler imaging with normal direction of blood flow towards the liver. Other: None. IMPRESSION: Sludge in the gallbladder. No sonographic evidence of acute cholecystitis. Electronically Signed   By: Abelardo Diesel M.D.   On: 09/01/2021 08:47    Assessment and Plan:   Michael Murphy is a 88 y.o. y/o male who comes in today with a ultrasound showing gallbladder sludge and a history of right upper quadrant pain with abnormal liver enzymes.  The patient will have his liver enzymes checked again today and if elevated he has  been told that then there is a real concern for some biliary obstruction.  The patient also has a HIDA scan scheduled for today and we will wait to see those results.  The patient may need an ERCP or MRCP if obstruction of the common bile duct is suspected.  The patient has been told of the risks associated with his advanced age of 89 years.  The patient has been explained the plan and agrees with it.    Lucilla Lame, MD. [facility]    Note: This dictation was prepared with Dragon dictation along with smaller phrase technology. Any transcriptional errors that result from this process are unintentional.

## 2021-09-18 ENCOUNTER — Encounter: Payer: Self-pay | Admitting: Gastroenterology

## 2021-09-18 ENCOUNTER — Ambulatory Visit: Payer: Medicare Other

## 2021-09-18 ENCOUNTER — Ambulatory Visit: Payer: Medicare Other | Admitting: General Practice

## 2021-09-18 ENCOUNTER — Other Ambulatory Visit: Payer: Self-pay | Admitting: Surgery

## 2021-09-18 ENCOUNTER — Encounter
Admission: RE | Disposition: A | Discharge status: Home / Self Care | Payer: Self-pay | Source: Home / Self Care | Attending: Gastroenterology

## 2021-09-18 ENCOUNTER — Other Ambulatory Visit: Payer: Self-pay

## 2021-09-18 ENCOUNTER — Ambulatory Visit
Admission: RE | Admit: 2021-09-18 | Discharge: 2021-09-18 | Disposition: A | Discharge status: Home / Self Care | Payer: Medicare Other | Source: Ambulatory Visit | Attending: Surgery | Admitting: Surgery

## 2021-09-18 ENCOUNTER — Ambulatory Visit
Admission: RE | Admit: 2021-09-18 | Discharge: 2021-09-18 | Disposition: A | Discharge status: Home / Self Care | Payer: Medicare Other | Attending: Gastroenterology | Admitting: Gastroenterology

## 2021-09-18 DIAGNOSIS — R634 Abnormal weight loss: Secondary | ICD-10-CM | POA: Diagnosis not present

## 2021-09-18 DIAGNOSIS — K769 Liver disease, unspecified: Secondary | ICD-10-CM | POA: Diagnosis not present

## 2021-09-18 DIAGNOSIS — E119 Type 2 diabetes mellitus without complications: Secondary | ICD-10-CM | POA: Diagnosis not present

## 2021-09-18 DIAGNOSIS — K819 Cholecystitis, unspecified: Secondary | ICD-10-CM

## 2021-09-18 DIAGNOSIS — Z87891 Personal history of nicotine dependence: Secondary | ICD-10-CM | POA: Diagnosis not present

## 2021-09-18 DIAGNOSIS — I10 Essential (primary) hypertension: Secondary | ICD-10-CM | POA: Insufficient documentation

## 2021-09-18 DIAGNOSIS — Z6822 Body mass index (BMI) 22.0-22.9, adult: Secondary | ICD-10-CM | POA: Diagnosis not present

## 2021-09-18 DIAGNOSIS — J449 Chronic obstructive pulmonary disease, unspecified: Secondary | ICD-10-CM | POA: Diagnosis not present

## 2021-09-18 DIAGNOSIS — R630 Anorexia: Secondary | ICD-10-CM | POA: Diagnosis not present

## 2021-09-18 DIAGNOSIS — K831 Obstruction of bile duct: Secondary | ICD-10-CM | POA: Diagnosis not present

## 2021-09-18 DIAGNOSIS — F17200 Nicotine dependence, unspecified, uncomplicated: Secondary | ICD-10-CM | POA: Diagnosis not present

## 2021-09-18 DIAGNOSIS — D490 Neoplasm of unspecified behavior of digestive system: Secondary | ICD-10-CM | POA: Insufficient documentation

## 2021-09-18 DIAGNOSIS — K8689 Other specified diseases of pancreas: Secondary | ICD-10-CM

## 2021-09-18 DIAGNOSIS — R1310 Dysphagia, unspecified: Secondary | ICD-10-CM | POA: Diagnosis not present

## 2021-09-18 HISTORY — PX: ERCP: SHX5425

## 2021-09-18 HISTORY — DX: Other specified diseases of pancreas: K86.89

## 2021-09-18 LAB — HEPATIC FUNCTION PANEL
ALT: 423 IU/L — ABNORMAL HIGH (ref 0–44)
AST: 309 IU/L — ABNORMAL HIGH (ref 0–40)
Albumin: 4.3 g/dL (ref 3.6–4.6)
Alkaline Phosphatase: 530 IU/L — ABNORMAL HIGH (ref 44–121)
Bilirubin Total: 10.5 mg/dL — ABNORMAL HIGH (ref 0.0–1.2)
Bilirubin, Direct: 8.35 mg/dL — ABNORMAL HIGH (ref 0.00–0.40)
Total Protein: 6.7 g/dL (ref 6.0–8.5)

## 2021-09-18 LAB — GLUCOSE, CAPILLARY: Glucose-Capillary: 130 mg/dL — ABNORMAL HIGH (ref 70–99)

## 2021-09-18 SURGERY — ERCP, WITH INTERVENTION IF INDICATED
Anesthesia: General

## 2021-09-18 MED ORDER — SUCCINYLCHOLINE CHLORIDE 200 MG/10ML IV SOSY
PREFILLED_SYRINGE | INTRAVENOUS | Status: DC | PRN
  Administered 2021-09-18: 100 mg via INTRAVENOUS

## 2021-09-18 MED ORDER — PHENYLEPHRINE 80 MCG/ML (10ML) SYRINGE FOR IV PUSH (FOR BLOOD PRESSURE SUPPORT)
PREFILLED_SYRINGE | INTRAVENOUS | Status: DC | PRN
  Administered 2021-09-18 (×2): 80 ug via INTRAVENOUS

## 2021-09-18 MED ORDER — DIPHENHYDRAMINE HCL 50 MG/ML IJ SOLN
INTRAMUSCULAR | Status: DC | PRN
  Administered 2021-09-18: 12.5 mg via INTRAVENOUS

## 2021-09-18 MED ORDER — LIDOCAINE HCL (CARDIAC) PF 100 MG/5ML IV SOSY
PREFILLED_SYRINGE | INTRAVENOUS | Status: DC | PRN
  Administered 2021-09-18: 100 mg via INTRAVENOUS

## 2021-09-18 MED ORDER — SUGAMMADEX SODIUM 200 MG/2ML IV SOLN
INTRAVENOUS | Status: DC | PRN
  Administered 2021-09-18: 400 mg via INTRAVENOUS

## 2021-09-18 MED ORDER — ROCURONIUM BROMIDE 100 MG/10ML IV SOLN
INTRAVENOUS | Status: DC | PRN
  Administered 2021-09-18: 30 mg via INTRAVENOUS
  Administered 2021-09-18: 10 mg via INTRAVENOUS

## 2021-09-18 MED ORDER — SODIUM CHLORIDE 0.9 % IV SOLN: INTRAVENOUS | Status: DC

## 2021-09-18 MED ORDER — DICLOFENAC SUPPOSITORY 100 MG
100.0000 mg | Freq: Once | RECTAL | Status: AC
Start: 2021-09-18 — End: 2021-09-18
  Administered 2021-09-18: 100 mg via RECTAL

## 2021-09-18 MED ORDER — LACTATED RINGERS IV SOLN: Freq: Once | INTRAVENOUS | Status: AC

## 2021-09-18 MED ORDER — GADOBUTROL 1 MMOL/ML IV SOLN
6.0000 mL | Freq: Once | INTRAVENOUS | Status: AC | PRN
  Administered 2021-09-18: 6 mL via INTRAVENOUS

## 2021-09-18 MED ORDER — PROPOFOL 10 MG/ML IV BOLUS
INTRAVENOUS | Status: DC | PRN
  Administered 2021-09-18: 100 mg via INTRAVENOUS
  Administered 2021-09-18: 50 mg via INTRAVENOUS

## 2021-09-18 MED ORDER — ESMOLOL HCL 100 MG/10ML IV SOLN
INTRAVENOUS | Status: DC | PRN
  Administered 2021-09-18: 5 mg via INTRAVENOUS

## 2021-09-18 MED ORDER — DICLOFENAC SUPPOSITORY 100 MG
RECTAL | Status: AC
  Filled 2021-09-18: qty 1

## 2021-09-18 MED ORDER — LACTATED RINGERS IV SOLN: INTRAVENOUS | Status: DC | PRN

## 2021-09-18 NOTE — Interval H&P Note (Signed)
Lucilla Lame, MD [address] [place], [place] [address] Phone:[phone] Fax : [phone]  Primary Care Physician:  Jerrol Banana., MD Primary Gastroenterologist:  Dr. Allen Norris  Pre-Procedure History & Physical: HPI:  Michael Murphy is a 88 y.o. male is here for an ERCP.   Past Medical History:  Diagnosis Date   Adiposity 07/12/2014   Allergic rhinitis 02/11/2007   Arthritis, degenerative 02/11/2007   Benign essential HTN 02/11/2007   Goal BP< 130/80 due DMII.    CA of prostate ([place]) 07/12/2014   Carpal tunnel syndrome 07/12/2014   Diabetes mellitus without complication ([place])    Diabetic neuropathy ([place]) 02/12/2009   s/p Lifestyle Education Diabetic Classes 2012.  Monofilament intact 09/18/2013-normal bilaterally.    ED (erectile dysfunction) of organic origin 01/23/2013   Epididymitis 07/12/2014   Excessive urination at night 01/23/2013   Hypercholesterolemia without hypertriglyceridemia 02/11/2007   Hypertension    Neurosis, posttraumatic 07/12/2014   ([place] [facility]) and ([place]).    OP (osteoporosis) 02/11/2007   Pancreatic mass     Past Surgical History:  Procedure Laterality Date   CATARACT EXTRACTION     KNEE ARTHROSCOPY     left   MULTIPLE TOOTH EXTRACTIONS     PROSTATECTOMY     ROTATOR CUFF REPAIR      Prior to Admission medications   Medication Sig Start Date End Date Taking? Authorizing Provider  amLODipine (NORVASC) 10 MG tablet Take 10 mg by mouth daily. 10/15/10  Yes [provider]  aspirin 81 MG chewable tablet Chew by mouth daily.   Yes [provider]  Coenzyme Q10 (CO Q 10 PO) Take by mouth.   Yes [provider]  fluticasone (FLONASE) 50 MCG/ACT nasal spray Place 1 spray into both nostrils as needed.  05/16/18  Yes [provider]  losartan (COZAAR) 50 MG tablet Take 1 tablet by mouth daily. 02/17/21  Yes [provider]  omeprazole (PRILOSEC) 20 MG capsule Take 1 capsule (20 mg  total) by mouth daily. 08/27/21  Yes Jerrol Banana., MD  simvastatin (ZOCOR) 20 MG tablet Take 0.5 tablets by mouth at bedtime.   Yes [provider]  tiotropium (SPIRIVA) 18 MCG inhalation capsule Place 18 mcg into inhaler and inhale every morning.   Yes [provider]  albuterol (PROVENTIL HFA;VENTOLIN HFA) 108 (90 Base) MCG/ACT inhaler Inhale 2 puffs into the lungs every 6 (six) hours as needed for wheezing or shortness of breath. 03/11/17   Jerrol Banana., MD  LUBRICATING PLUS EYE DROPS 0.5 % SOLN Apply to eye. 01/03/20   [provider]  meclizine (ANTIVERT) 25 MG tablet Take 1 tablet (25 mg total) by mouth as needed for dizziness. 12/06/19   Jerrol Banana., MD  metFORMIN (GLUCOPHAGE) 1000 MG tablet Take 1 tablet (1,000 mg total) by mouth 2 (two) times daily with a meal. 07/18/21   Jerrol Banana., MD  Multiple Vitamins-Minerals (MENS MULTIVITAMIN PLUS PO) Take by mouth daily.    [provider]  sucralfate (CARAFATE) 1 g tablet Take 1 tablet (1 g total) by mouth 4 (four) times daily -  with meals and at bedtime. 08/27/21   Jerrol Banana., MD  triamcinolone ointment (KENALOG) 0.1 % APPLY TOPICALLY TO AFFECTED AREAS WITH RASH ONCE TO TWICE DAILY AS NEEDED. AVOID FACE, GROIN, AND AXILLA. 06/18/21   Moye, [place], MD  WIXELA INHUB 250-50 MCG/ACT AEPB Inhale 1 puff into the lungs 2 (  two) times daily. 07/02/20   [provider]    Allergies as of 09/18/2021 - Review Complete 09/18/2021  Allergen Reaction Noted   Accupril [quinapril hcl] Anaphylaxis 07/12/2014   Quinapril Swelling 01/23/2013   Flunisolide  11/05/2004   Diclofenac Rash 02/17/2021    Family History  Problem Relation Age of Onset   Hypertension Mother    Stroke Mother    Diabetes Sister    Hypertension Sister    Diabetes Brother    Diabetes Brother     Social History   Socioeconomic History   Marital status: Widowed    Spouse name: Not on  file   Number of children: 8   Years of education: Not on file   Highest education level: GED or equivalent  Occupational History   Occupation: retired  Tobacco Use   Smoking status: Former    Packs/day: 0.50    Years: 18.00    Total pack years: 9.00    Types: Cigarettes    Quit date: 03/09/1991    Years since quitting: 30.5   Smokeless tobacco: Never  Vaping Use   Vaping Use: Never used  Substance and Sexual Activity   Alcohol use: Yes    Alcohol/week: 1.0 - 3.0 standard drink of alcohol    Types: 1 - 3 Cans of beer per week    Comment: none last 2 months   Drug use: No   Sexual activity: Yes    Birth control/protection: None  Other Topics Concern   Not on file  Social History Narrative   Not on file   Social Determinants of Health   Financial Resource Strain: Low Risk  (08/31/2019)   Overall Financial Resource Strain (CARDIA)    Difficulty of Paying Living Expenses: Not hard at all  Food Insecurity: No Food Insecurity (08/31/2019)   Hunger Vital Sign    Worried About Running Out of Food in the Last Year: Never true    Ran Out of Food in the Last Year: Never true  Transportation Needs: No Transportation Needs (08/31/2019)   PRAPARE - Hydrologist (Medical): No    Lack of Transportation (Non-Medical): No  Physical Activity: Inactive (08/30/2018)   Exercise Vital Sign    Days of Exercise per Week: 0 days    Minutes of Exercise per Session: 0 min  Stress: No Stress Concern Present (08/31/2019)   [place]    Feeling of Stress : Not at all  Social Connections: Moderately Isolated (08/31/2019)   Social Connection and Isolation Panel [NHANES]    Frequency of Communication with Friends and Family: More than three times a week    Frequency of Social Gatherings with Friends and Family: Once a week    Attends Religious Services: More than 4 times per year    Active Member of [employer] or  Organizations: No    Attends Archivist Meetings: Never    Marital Status: Widowed  Intimate Partner Violence: Not At Risk (08/31/2019)   Humiliation, Afraid, Rape, and Kick questionnaire    Fear of Current or Ex-Partner: No    Emotionally Abused: No    Physically Abused: No    Sexually Abused: No    Review of Systems: See HPI, otherwise negative ROS  Physical Exam: BP (!) 161/77   Pulse 84   Temp (!) 97 F (36.1 C) (Temporal)   Resp 18   Ht '5\' 8"'$  (1.727 m)   Wt  68 kg   SpO2 100%   BMI 22.81 kg/m  General:   Alert,  pleasant and cooperative in NAD Head:  Normocephalic and atraumatic. Neck:  Supple; no masses or thyromegaly. Lungs:  Clear throughout to auscultation.    Heart:  Regular rate and rhythm. Abdomen:  Soft, nontender and nondistended. Normal bowel sounds, without guarding, and without rebound.   Neurologic:  Alert and  oriented x4;  grossly normal neurologically.  Impression/Plan: Michael Murphy is here for an ERCP to be performed for pancreatic mass  Risks, benefits, limitations, and alternatives regarding  ERCP have been reviewed with the patient.  Questions have been answered.  All parties agreeable.   Lucilla Lame, MD  09/18/2021, 2:18 PM

## 2021-09-18 NOTE — Anesthesia Preprocedure Evaluation (Signed)
Anesthesia Evaluation    Airway Mallampati: III       Dental  (+) Lower Dentures, Upper Dentures   Pulmonary COPD,  COPD inhaler, former smoker,           Cardiovascular hypertension,      Neuro/Psych PSYCHIATRIC DISORDERS Anxiety    GI/Hepatic   Endo/Other  diabetes  Renal/GU      Musculoskeletal   Abdominal   Peds  Hematology   Anesthesia Other Findings   Reproductive/Obstetrics                             Anesthesia Physical Anesthesia Plan  ASA: 3  Anesthesia Plan: General   Post-op Pain Management:    Induction: Intravenous  PONV Risk Score and Plan: 2  Airway Management Planned: Oral ETT  Additional Equipment:   Intra-op Plan:   Post-operative Plan: Extubation in OR  Informed Consent: I have reviewed the patients History and Physical, chart, labs and discussed the procedure including the risks, benefits and alternatives for the proposed anesthesia with the patient or authorized representative who has indicated his/her understanding and acceptance.     Dental Advisory Given  Plan Discussed with: Anesthesiologist, CRNA and Surgeon  Anesthesia Plan Comments: (Patient consented for risks of anesthesia including but not limited to:  - adverse reactions to medications - damage to eyes, teeth, lips or other oral mucosa - nerve damage due to positioning  - sore throat or hoarseness - Damage to heart, brain, nerves, lungs, other parts of body or loss of life  Patient voiced understanding.)        Anesthesia Quick Evaluation

## 2021-09-18 NOTE — OR Nursing (Signed)
Per dr Ralene Ok has reaction to diclofenac but med to be adm

## 2021-09-18 NOTE — Op Note (Signed)
[facility] Gastroenterology Patient Name: Michael Murphy Procedure Date: 09/18/2021 2:18 PM MRN: [id number] Account #: [id number] Date of Birth: 01-12-1928 Admit Type: Outpatient Age: 88 Room: [facility] ENDO ROOM 4 Gender: Male Note Status: Finalized Instrument Name: [id number] Procedure:             ERCP Indications:           Tumor of the head of pancreas Providers:             Lucilla Lame MD, MD Referring MD:          Janine Ores. Rosanna Randy, MD (Referring MD) Medicines:             General Anesthesia Complications:         No immediate complications. Procedure:             Pre-Anesthesia Assessment:                        - Prior to the procedure, a History and Physical was                         performed, and patient medications and allergies were                         reviewed. The patient's tolerance of previous                         anesthesia was also reviewed. The risks and benefits                         of the procedure and the sedation options and risks                         were discussed with the patient. All questions were                         answered, and informed consent was obtained. Prior                         Anticoagulants: The patient has taken no previous                         anticoagulant or antiplatelet agents. ASA Grade                         Assessment: II - A patient with mild systemic disease.                         After reviewing the risks and benefits, the patient                         was deemed in satisfactory condition to undergo the                         procedure.                        After obtaining informed consent, the scope was passed  under direct vision. Throughout the procedure, the                         patient's blood pressure, pulse, and oxygen                         saturations were monitored continuously. The                         Duodenoscope was introduced through  the mouth, and                         used to inject contrast into and used to cannulate the                         bile duct. The ERCP was accomplished without                         difficulty. The patient tolerated the procedure well. Findings:      The scout film was normal. The esophagus was successfully intubated       under direct vision. The scope was advanced to a normal major papilla in       the descending duodenum without detailed examination of the pharynx,       larynx and associated structures, and upper GI tract. The upper GI tract       was grossly normal. The bile duct was deeply cannulated with the       short-nosed traction sphincterotome. Contrast was injected. I personally       interpreted the bile duct images. There was brisk flow of contrast       through the ducts. Image quality was excellent. Contrast extended to the       entire biliary tree. The lower third of the main bile duct contained a       single segmental stenosis. A wire was passed into the biliary tree. A 10       mm biliary sphincterotomy was made with a traction (standard)       sphincterotome using ERBE electrocautery. There was no       post-sphincterotomy bleeding. One 10 Fr by 8 cm uncovered metal stent       was placed 6 cm into the common bile duct. Bile flowed through the       stent. The stent was in good position. Impression:            - A single segmental biliary stricture was found in                         the lower third of the main bile duct. The stricture                         was malignant appearing.                        - A biliary sphincterotomy was performed.                        - One uncovered metal stent was placed into the common  bile duct. Recommendation:        - Discharge patient to home.                        - Continue present medications.                        - Watch for pancreatitis, bleeding, perforation, and                          cholangitis.                        - Clear liquid diet today. Procedure Code(s):     --- Professional ---                        [phone], Endoscopic retrograde cholangiopancreatography                         (ERCP); with placement of endoscopic stent into                         biliary or pancreatic duct, including pre- and                         post-dilation and guide wire passage, when performed,                         including sphincterotomy, when performed, each stent                        [id number], Endoscopic catheterization of the biliary                         ductal system, radiological supervision and                         interpretation Diagnosis Code(s):     --- Professional ---                        D49.0, Neoplasm of unspecified behavior of digestive                         system                        K83.1, Obstruction of bile duct CPT copyright 2019 American Medical Association. All rights reserved. The codes documented in this report are preliminary and upon coder review may  be revised to meet current compliance requirements. Lucilla Lame MD, MD 09/18/2021 3:07:48 PM This report has been signed electronically. Number of Addenda: 0 Note Initiated On: 09/18/2021 2:18 PM Estimated Blood Loss:  Estimated blood loss: none.      [facility]

## 2021-09-18 NOTE — Anesthesia Procedure Notes (Signed)
Procedure Name: Intubation Date/Time: 09/18/2021 2:27 PM  Performed by: Kelton Pillar, CRNAPre-anesthesia Checklist: Patient identified, Emergency Drugs available, Suction available and Patient being monitored Patient Re-evaluated:Patient Re-evaluated prior to induction Oxygen Delivery Method: Circle system utilized Preoxygenation: Pre-oxygenation with 100% oxygen Induction Type: IV induction Ventilation: Mask ventilation without difficulty Laryngoscope Size: McGraph and 3 Grade View: Grade I Tube type: Oral Tube size: 6.5 mm Number of attempts: 1 Airway Equipment and Method: Stylet and Oral airway Placement Confirmation: ETT inserted through vocal cords under direct vision, positive ETCO2, breath sounds checked- equal and bilateral and CO2 detector Secured at: 21 cm Tube secured with: Tape Dental Injury: Teeth and Oropharynx as per pre-operative assessment

## 2021-09-18 NOTE — Transfer of Care (Signed)
Immediate Anesthesia Transfer of Care Note  Patient: Michael Murphy  Procedure(s) Performed: ENDOSCOPIC RETROGRADE CHOLANGIOPANCREATOGRAPHY (ERCP)  Patient Location: Endoscopy Unit  Anesthesia Type:General  Level of Consciousness: drowsy and patient cooperative  Airway & Oxygen Therapy: Patient Spontanous Breathing and Patient connected to face mask oxygen  Post-op Assessment: Report given to RN and Post -op Vital signs reviewed and stable  Post vital signs: Reviewed and stable  Last Vitals:  Vitals Value Taken Time  BP 176/88 09/18/21 1512  Temp    Pulse 78 09/18/21 1518  Resp 19 09/18/21 1518  SpO2 100 % 09/18/21 1518  Vitals shown include unvalidated device data.  Last Pain:  Vitals:   09/18/21 1512  TempSrc:   PainSc: Asleep         Complications: No notable events documented.

## 2021-09-19 ENCOUNTER — Encounter: Payer: Self-pay | Admitting: Gastroenterology

## 2021-09-19 NOTE — Anesthesia Postprocedure Evaluation (Signed)
Anesthesia Post Note  Patient: Michael Murphy  Procedure(s) Performed: ENDOSCOPIC RETROGRADE CHOLANGIOPANCREATOGRAPHY (ERCP)  Patient location during evaluation: PACU Anesthesia Type: General Level of consciousness: awake and alert Pain management: pain level controlled Vital Signs Assessment: post-procedure vital signs reviewed and stable Respiratory status: spontaneous breathing, nonlabored ventilation, respiratory function stable and patient connected to nasal cannula oxygen Cardiovascular status: blood pressure returned to baseline and stable Postop Assessment: no apparent nausea or vomiting Anesthetic complications: no   No notable events documented.   Last Vitals:  Vitals:   09/18/21 1532 09/18/21 1542  BP: (!) 182/88 (!) 170/116  Pulse: 79 74  Resp: (!) 23 (!) 22  Temp:    SpO2: 99% 100%    Last Pain:  Vitals:   09/18/21 1542  TempSrc:   PainSc: 0-No pain                 Martha Clan

## 2021-09-23 ENCOUNTER — Encounter: Payer: Self-pay | Admitting: Family Medicine

## 2021-09-23 ENCOUNTER — Ambulatory Visit (INDEPENDENT_AMBULATORY_CARE_PROVIDER_SITE_OTHER): Payer: Medicare Other | Admitting: Family Medicine

## 2021-09-23 VITALS — BP 136/65 | HR 75 | Temp 97.8°F | Wt 148.0 lb

## 2021-09-23 DIAGNOSIS — C259 Malignant neoplasm of pancreas, unspecified: Secondary | ICD-10-CM | POA: Diagnosis not present

## 2021-09-23 DIAGNOSIS — R634 Abnormal weight loss: Secondary | ICD-10-CM | POA: Diagnosis not present

## 2021-09-23 NOTE — Progress Notes (Signed)
I,Roshena L Chambers,acting as a scribe for [name], MD.,have documented all relevant documentation on the behalf of [name], MD,as directed by  [name], MD while in the presence of [name], MD.    Established patient visit   Patient: Michael Murphy   DOB: 07-03-1927   88 y.o. Male  MRN: [id number] Visit Date: 09/23/2021  Today's healthcare provider: [name], MD   Chief Complaint  Patient presents with   Follow-up   Subjective    HPI   Patient is a 88 year old male who presents for follow up of his RUQ pain.  He was last seen here on 08/27/21. Since that time the patient underwent imaging and ERCP.  Impression was of a biliary stricture that was malignant appearing.  Sphincterotomy was performed, metal stent placement in the common bile duct was performed and patient was advised to watch for signs of pancreatitis, bleeding, perforation and cholangitis. Patient reports improvement in RUQ pain since last visit. He complians of decreased appetite. Dr. Allen Norris did  MRCP without metal stent.  Unfortunately MR of the pancreas reveals a pancreatic head mass consistent with adenocarcinoma.  He has seen GI and surgery but not oncology.  He was unaware of the diagnosis for the visit this morning.  His 3 daughters accompany him.   Medications: Outpatient Medications Prior to Visit  Medication Sig   albuterol (PROVENTIL HFA;VENTOLIN HFA) 108 (90 Base) MCG/ACT inhaler Inhale 2 puffs into the lungs every 6 (six) hours as needed for wheezing or shortness of breath.   amLODipine (NORVASC) 10 MG tablet Take 10 mg by mouth daily.   aspirin 81 MG chewable tablet Chew by mouth daily.   Coenzyme Q10 (CO Q 10 PO) Take by mouth.   fluticasone (FLONASE) 50 MCG/ACT nasal spray Place 1 spray into both nostrils as needed.    losartan (COZAAR) 50 MG tablet Take 1 tablet by mouth daily.   LUBRICATING PLUS EYE DROPS 0.5 % SOLN Apply to eye.   meclizine  (ANTIVERT) 25 MG tablet Take 1 tablet (25 mg total) by mouth as needed for dizziness.   metFORMIN (GLUCOPHAGE) 1000 MG tablet Take 1 tablet (1,000 mg total) by mouth 2 (two) times daily with a meal.   Multiple Vitamins-Minerals (MENS MULTIVITAMIN PLUS PO) Take by mouth daily.   omeprazole (PRILOSEC) 20 MG capsule Take 1 capsule (20 mg total) by mouth daily.   simvastatin (ZOCOR) 20 MG tablet Take 0.5 tablets by mouth at bedtime.   sucralfate (CARAFATE) 1 g tablet Take 1 tablet (1 g total) by mouth 4 (four) times daily -  with meals and at bedtime.   tiotropium (SPIRIVA) 18 MCG inhalation capsule Place 18 mcg into inhaler and inhale every morning.   triamcinolone ointment (KENALOG) 0.1 % APPLY TOPICALLY TO AFFECTED AREAS WITH RASH ONCE TO TWICE DAILY AS NEEDED. AVOID FACE, GROIN, AND AXILLA.   WIXELA INHUB 250-50 MCG/ACT AEPB Inhale 1 puff into the lungs 2 (two) times daily.   No facility-administered medications prior to visit.    Review of Systems  Constitutional:  Positive for appetite change and unexpected weight change. Negative for chills and fever.  Respiratory:  Negative for chest tightness, shortness of breath and wheezing.   Cardiovascular:  Negative for chest pain and palpitations.  Gastrointestinal:  Positive for abdominal pain. Negative for nausea and vomiting.    Last metabolic panel Lab Results  Component Value Date   GLUCOSE 163 (H)  09/23/2021   NA 143 09/23/2021   K 4.1 09/23/2021   CL 102 09/23/2021   CO2 22 09/23/2021   BUN 12 09/23/2021   CREATININE 0.69 (L) 09/23/2021   EGFR 87 09/23/2021   CALCIUM 10.1 09/23/2021   PROT 7.3 09/23/2021   ALBUMIN 4.5 09/23/2021   LABGLOB 2.8 09/23/2021   AGRATIO 1.6 09/23/2021   BILITOT 3.5 (H) 09/23/2021   ALKPHOS 353 (H) 09/23/2021   AST 121 (H) 09/23/2021   ALT 300 (H) 09/23/2021   ANIONGAP 11 11/27/2019       Objective    BP 136/65 (BP Location: Left Arm, Patient Position: Sitting, Cuff Size: Normal)   Pulse 75    Temp 97.8 F (36.6 C) (Oral)   Wt 148 lb (67.1 kg)   SpO2 100% Comment: room air  BMI 22.50 kg/m  BP Readings from Last 3 Encounters:  09/23/21 136/65  09/18/21 (!) 170/116  09/17/21 125/75   Wt Readings from Last 3 Encounters:  09/23/21 148 lb (67.1 kg)  09/18/21 150 lb (68 kg)  09/17/21 150 lb (68 kg)      Physical Exam Vitals reviewed.  Constitutional:      General: He is not in acute distress.    Appearance: He is well-developed.  HENT:     Head: Normocephalic and atraumatic.     Right Ear: Hearing normal.     Left Ear: Hearing normal.     Nose: Nose normal.  Eyes:     General: Lids are normal. No scleral icterus.       Right eye: No discharge.        Left eye: No discharge.     Conjunctiva/sclera: Conjunctivae normal.  Cardiovascular:     Rate and Rhythm: Normal rate and regular rhythm.     Heart sounds: Normal heart sounds.  Pulmonary:     Effort: Pulmonary effort is normal. No respiratory distress.  Abdominal:     General: There is no distension.     Palpations: Abdomen is soft.     Tenderness: There is no abdominal tenderness.  Skin:    Findings: No lesion or rash.  Neurological:     General: No focal deficit present.     Mental Status: He is alert and oriented to person, place, and time.  Psychiatric:        Mood and Affect: Mood normal.        Speech: Speech normal.        Behavior: Behavior normal.        Thought Content: Thought content normal.        Judgment: Judgment normal.       No results found for any visits on 09/23/21.  Assessment & Plan     1. Malignant neoplasm of pancreas, unspecified location of malignancy [facility]) I discussed with patient and his daughters that this was cancer that has a poor prognosis.  Go ahead and refer to oncology for potential options.  Family was asking about travel, told them to go ahead and travel now so he can enjoy family. - CBC with Differential/Platelet - Comprehensive Metabolic Panel (CMET) -  Lipase - Ambulatory referral to Hematology / Oncology  2. Weight loss I think all the weight loss is due to the cancer.  I am not worried about his blood sugar, he can eat what ever he wants to.  I will follow-up with him in a month. More than 50% 35 minute visit spent in counseling or coordination of care  -  CBC with Differential/Platelet - Comprehensive Metabolic Panel (CMET) - Lipase   Return in about 27 days (around 10/20/2021).      I, [name], MD, have reviewed all documentation for this visit. The documentation on 09/25/21 for the exam, diagnosis, procedures, and orders are all accurate and complete.    Elizabth Palka Cranford Mon, MD  [facility] [phone] (phone) [phone] (fax)  [place]

## 2021-09-23 NOTE — Patient Instructions (Signed)
Stop Sucralfate. May eat whatever you want.

## 2021-09-24 LAB — CBC WITH DIFFERENTIAL/PLATELET
Basophils Absolute: 0 10*3/uL (ref 0.0–0.2)
Basos: 0 %
EOS (ABSOLUTE): 0.2 10*3/uL (ref 0.0–0.4)
Eos: 2 %
Hematocrit: 39.5 % (ref 37.5–51.0)
Hemoglobin: 12.9 g/dL — ABNORMAL LOW (ref 13.0–17.7)
Immature Grans (Abs): 0 10*3/uL (ref 0.0–0.1)
Immature Granulocytes: 0 %
Lymphocytes Absolute: 2.2 10*3/uL (ref 0.7–3.1)
Lymphs: 20 %
MCH: 28.5 pg (ref 26.6–33.0)
MCHC: 32.7 g/dL (ref 31.5–35.7)
MCV: 87 fL (ref 79–97)
Monocytes Absolute: 0.9 10*3/uL (ref 0.1–0.9)
Monocytes: 8 %
Neutrophils Absolute: 7.4 10*3/uL — ABNORMAL HIGH (ref 1.4–7.0)
Neutrophils: 70 %
Platelets: 262 10*3/uL (ref 150–450)
RBC: 4.52 x10E6/uL (ref 4.14–5.80)
RDW: 13.7 % (ref 11.6–15.4)
WBC: 10.6 10*3/uL (ref 3.4–10.8)

## 2021-09-24 LAB — COMPREHENSIVE METABOLIC PANEL
ALT: 300 IU/L — ABNORMAL HIGH (ref 0–44)
AST: 121 IU/L — ABNORMAL HIGH (ref 0–40)
Albumin/Globulin Ratio: 1.6 (ref 1.2–2.2)
Albumin: 4.5 g/dL (ref 3.6–4.6)
Alkaline Phosphatase: 353 IU/L — ABNORMAL HIGH (ref 44–121)
BUN/Creatinine Ratio: 17 (ref 10–24)
BUN: 12 mg/dL (ref 10–36)
Bilirubin Total: 3.5 mg/dL — ABNORMAL HIGH (ref 0.0–1.2)
CO2: 22 mmol/L (ref 20–29)
Calcium: 10.1 mg/dL (ref 8.6–10.2)
Chloride: 102 mmol/L (ref 96–106)
Creatinine, Ser: 0.69 mg/dL — ABNORMAL LOW (ref 0.76–1.27)
Globulin, Total: 2.8 g/dL (ref 1.5–4.5)
Glucose: 163 mg/dL — ABNORMAL HIGH (ref 70–99)
Potassium: 4.1 mmol/L (ref 3.5–5.2)
Sodium: 143 mmol/L (ref 134–144)
Total Protein: 7.3 g/dL (ref 6.0–8.5)
eGFR: 87 mL/min/{1.73_m2} (ref >59)

## 2021-09-24 LAB — LIPASE: Lipase: 13 U/L (ref 13–78)

## 2021-09-24 MED ORDER — MIRTAZAPINE 30 MG PO TBDP: 30.0000 mg | ORAL_TABLET | Freq: Every day | ORAL | 11 refills | Status: DC

## 2021-09-25 ENCOUNTER — Ambulatory Visit: Payer: Medicare Other | Admitting: Family Medicine

## 2021-09-26 ENCOUNTER — Other Ambulatory Visit: Payer: Self-pay

## 2021-09-26 ENCOUNTER — Telehealth: Payer: Self-pay

## 2021-09-26 ENCOUNTER — Inpatient Hospital Stay: Payer: Medicare Other

## 2021-09-26 ENCOUNTER — Encounter: Payer: Self-pay | Admitting: Oncology

## 2021-09-26 ENCOUNTER — Inpatient Hospital Stay: Payer: Medicare Other | Attending: Oncology | Admitting: Oncology

## 2021-09-26 VITALS — BP 111/62 | HR 76 | Temp 98.6°F | Resp 16 | Wt 148.1 lb

## 2021-09-26 DIAGNOSIS — Z87891 Personal history of nicotine dependence: Secondary | ICD-10-CM | POA: Diagnosis not present

## 2021-09-26 DIAGNOSIS — Z79899 Other long term (current) drug therapy: Secondary | ICD-10-CM | POA: Diagnosis not present

## 2021-09-26 DIAGNOSIS — C259 Malignant neoplasm of pancreas, unspecified: Secondary | ICD-10-CM

## 2021-09-26 DIAGNOSIS — D379 Neoplasm of uncertain behavior of digestive organ, unspecified: Secondary | ICD-10-CM | POA: Diagnosis not present

## 2021-09-26 DIAGNOSIS — K831 Obstruction of bile duct: Secondary | ICD-10-CM | POA: Insufficient documentation

## 2021-09-26 DIAGNOSIS — R634 Abnormal weight loss: Secondary | ICD-10-CM | POA: Insufficient documentation

## 2021-09-26 DIAGNOSIS — K8689 Other specified diseases of pancreas: Secondary | ICD-10-CM | POA: Insufficient documentation

## 2021-09-26 DIAGNOSIS — E119 Type 2 diabetes mellitus without complications: Secondary | ICD-10-CM | POA: Insufficient documentation

## 2021-09-26 DIAGNOSIS — R933 Abnormal findings on diagnostic imaging of other parts of digestive tract: Secondary | ICD-10-CM | POA: Diagnosis not present

## 2021-09-26 DIAGNOSIS — I10 Essential (primary) hypertension: Secondary | ICD-10-CM | POA: Diagnosis not present

## 2021-09-26 LAB — CBC WITH DIFFERENTIAL/PLATELET
Abs Immature Granulocytes: 0.04 10*3/uL (ref 0.00–0.07)
Basophils Absolute: 0 10*3/uL (ref 0.0–0.1)
Basophils Relative: 0 %
Eosinophils Absolute: 0.2 10*3/uL (ref 0.0–0.5)
Eosinophils Relative: 2 %
HCT: 41.1 % (ref 39.0–52.0)
Hemoglobin: 13.7 g/dL (ref 13.0–17.0)
Immature Granulocytes: 0 %
Lymphocytes Relative: 18 %
Lymphs Abs: 2.2 10*3/uL (ref 0.7–4.0)
MCH: 28.8 pg (ref 26.0–34.0)
MCHC: 33.3 g/dL (ref 30.0–36.0)
MCV: 86.5 fL (ref 80.0–100.0)
Monocytes Absolute: 1.1 10*3/uL — ABNORMAL HIGH (ref 0.1–1.0)
Monocytes Relative: 9 %
Neutro Abs: 9.1 10*3/uL — ABNORMAL HIGH (ref 1.7–7.7)
Neutrophils Relative %: 71 %
Platelets: 317 10*3/uL (ref 150–400)
RBC: 4.75 MIL/uL (ref 4.22–5.81)
RDW: 14 % (ref 11.5–15.5)
WBC: 12.7 10*3/uL — ABNORMAL HIGH (ref 4.0–10.5)
nRBC: 0 % (ref 0.0–0.2)

## 2021-09-26 LAB — COMPREHENSIVE METABOLIC PANEL
ALT: 168 U/L — ABNORMAL HIGH (ref 0–44)
AST: 62 U/L — ABNORMAL HIGH (ref 15–41)
Albumin: 4.4 g/dL (ref 3.5–5.0)
Alkaline Phosphatase: 252 U/L — ABNORMAL HIGH (ref 38–126)
Anion gap: 9 (ref 5–15)
BUN: 17 mg/dL (ref 8–23)
CO2: 26 mmol/L (ref 22–32)
Calcium: 10.5 mg/dL — ABNORMAL HIGH (ref 8.9–10.3)
Chloride: 103 mmol/L (ref 98–111)
Creatinine, Ser: 0.73 mg/dL (ref 0.61–1.24)
GFR, Estimated: >60 mL/min (ref >60)
Glucose, Bld: 145 mg/dL — ABNORMAL HIGH (ref 70–99)
Potassium: 4.5 mmol/L (ref 3.5–5.1)
Sodium: 138 mmol/L (ref 135–145)
Total Bilirubin: 3.1 mg/dL — ABNORMAL HIGH (ref 0.3–1.2)
Total Protein: 8.1 g/dL (ref 6.5–8.1)

## 2021-09-26 MED ORDER — OXYCODONE HCL 5 MG PO TABS: 5.0000 mg | ORAL_TABLET | Freq: Three times a day (TID) | ORAL | 0 refills | Status: DC | PRN

## 2021-09-26 NOTE — Progress Notes (Signed)
Hematology/Oncology Consult note [facility] [phone] [phone]  Patient Care Team: Jerrol Banana., MD as PCP - General (Family Medicine) Kate Sable, MD as PCP - Cardiology (Cardiology) Sharlotte Alamo, DPM as Consulting Physician (Podiatry) Lendon Collar, MD as Referring Physician (Family Medicine) Clent Jacks, RN as Oncology Nurse Navigator   Name of the patient: Michael Murphy  [id number]  01/02/1928    Reason for referral-pancreatic mass   Referring physician-Dr. Rosanna Randy  Date of visit: 09/26/21   History of presenting illness- Patient is a 88 year old man with a past medical history significant for diabetes hypertension hyperlipidemia who has been referred for pancreatic mass.  Patient had an MRI abdomen with MRCP for elevated bilirubin and unintentional weight loss which showed a hypoenhancing mass in the head of the pancreas measuring 2.6 cm.  There was contact of tumor with SMA and SMV.  2 subcentimeter liver lesions too small to characterize.  Patient underwent ERCP for obstructive jaundice when his bilirubin was elevated at 10 by Dr. Aleda Grana which showed a biliary stricture in the lower third of the main bile duct which was malignant appearing.  Sphincterotomy was performed and a metal stent was placed.  No biopsies were taken.  Bilirubin did come down to 3.5 from 10.5 after the procedure.  AST ALT and alkaline phosphatase also mildly elevated but overall trending down.  Patient currently reports about 60 pound weight loss in the last 6 months.  Mild epigastric abdominal pain.  He was previously living alone but now one of his daughters lives with him.  He has 8 living children.  ECOG PS- 1  Pain scale- 3   Review of systems- Review of Systems  Constitutional:  Positive for malaise/fatigue and weight loss. Negative for chills and fever.  HENT:  Negative for congestion, ear discharge and nosebleeds.   Eyes:   Negative for blurred vision.  Respiratory:  Negative for cough, hemoptysis, sputum production, shortness of breath and wheezing.   Cardiovascular:  Negative for chest pain, palpitations, orthopnea and claudication.  Gastrointestinal:  Positive for abdominal pain. Negative for blood in stool, constipation, diarrhea, heartburn, melena, nausea and vomiting.  Genitourinary:  Negative for dysuria, flank pain, frequency, hematuria and urgency.  Musculoskeletal:  Negative for back pain, joint pain and myalgias.  Skin:  Negative for rash.  Neurological:  Negative for dizziness, tingling, focal weakness, seizures, weakness and headaches.  Endo/Heme/Allergies:  Does not bruise/bleed easily.  Psychiatric/Behavioral:  Negative for depression and suicidal ideas. The patient does not have insomnia.     Allergies  Allergen Reactions   Accupril [Quinapril Hcl] Anaphylaxis   Quinapril Swelling   Flunisolide     Other reaction(s): Burning sensation of nose   Diclofenac Rash    Patient Active Problem List   Diagnosis Date Noted   Pancreatic mass    Cholecystitis 09/02/2021   Eczema 03/12/2020   COPD exacerbation ([place]) 11/27/2019   Erectile dysfunction after radical prostatectomy 01/11/2019   Encysted hydrocele 01/11/2019   Right hydrocele 05/02/2018   Spermatocele 05/02/2018   Type 2 diabetes mellitus without complication, without long-term current use of insulin ([place]) 05/04/2017   Abnormal laboratory test 07/12/2014   Carpal tunnel syndrome 07/12/2014   Neurosis, posttraumatic 07/12/2014   CA of prostate ([place]) 07/12/2014   Excessive urination at night 01/23/2013   ED (erectile dysfunction) of organic origin 01/23/2013   Diabetic neuropathy ([place]) 02/12/2009   Allergic rhinitis 02/11/2007   Benign essential HTN 02/11/2007   Arthritis,  degenerative 02/11/2007   OP (osteoporosis) 02/11/2007   Hypercholesterolemia without hypertriglyceridemia 02/11/2007     Past Medical History:  Diagnosis  Date   Adiposity 07/12/2014   Allergic rhinitis 02/11/2007   Arthritis, degenerative 02/11/2007   Benign essential HTN 02/11/2007   Goal BP< 130/80 due DMII.    CA of prostate ([place]) 07/12/2014   Carpal tunnel syndrome 07/12/2014   Diabetes mellitus without complication ([place])    Diabetic neuropathy ([place]) 02/12/2009   s/p Lifestyle Education Diabetic Classes 2012.  Monofilament intact 09/18/2013-normal bilaterally.    ED (erectile dysfunction) of organic origin 01/23/2013   Epididymitis 07/12/2014   Excessive urination at night 01/23/2013   Hypercholesterolemia without hypertriglyceridemia 02/11/2007   Hypertension    Neurosis, posttraumatic 07/12/2014   ([place]) and ([place]).    OP (osteoporosis) 02/11/2007   Pancreatic mass      Past Surgical History:  Procedure Laterality Date   CATARACT EXTRACTION     ERCP N/A 09/18/2021   Procedure: ENDOSCOPIC RETROGRADE CHOLANGIOPANCREATOGRAPHY (ERCP);  Surgeon: Lucilla Lame, MD;  Location: [facility] ENDOSCOPY;  Service: Endoscopy;  Laterality: N/A;   KNEE ARTHROSCOPY     left   MULTIPLE TOOTH EXTRACTIONS     PROSTATECTOMY     ROTATOR CUFF REPAIR      Social History   Socioeconomic History   Marital status: Widowed    Spouse name: Not on file   Number of children: 8   Years of education: Not on file   Highest education level: GED or equivalent  Occupational History   Occupation: retired  Tobacco Use   Smoking status: Former    Packs/day: 0.50    Years: 18.00    Total pack years: 9.00    Types: Cigarettes    Quit date: 03/09/1991    Years since quitting: 30.5   Smokeless tobacco: Never  Vaping Use   Vaping Use: Never used  Substance and Sexual Activity   Alcohol use: Yes    Alcohol/week: 1.0 - 3.0 standard drink of alcohol    Types: 1 - 3 Cans of beer per week    Comment: none last 2 months   Drug use: No   Sexual activity: Yes    Birth control/protection: None  Other Topics Concern   Not on file  Social History  Narrative   Not on file   Social Determinants of Health   Financial Resource Strain: Low Risk  (08/31/2019)   Overall Financial Resource Strain (CARDIA)    Difficulty of Paying Living Expenses: Not hard at all  Food Insecurity: No Food Insecurity (08/31/2019)   Hunger Vital Sign    Worried About Running Out of Food in the Last Year: Never true    Ran Out of Food in the Last Year: Never true  Transportation Needs: No Transportation Needs (08/31/2019)   PRAPARE - Hydrologist (Medical): No    Lack of Transportation (Non-Medical): No  Physical Activity: Inactive (08/30/2018)   Exercise Vital Sign    Days of Exercise per Week: 0 days    Minutes of Exercise per Session: 0 min  Stress: No Stress Concern Present (08/31/2019)   [place]    Feeling of Stress : Not at all  Social Connections: Moderately Isolated (08/31/2019)   Social Connection and Isolation Panel [NHANES]    Frequency of Communication with Friends and Family: More than three times a week    Frequency of Social Gatherings with  Friends and Family: Once a week    Attends Religious Services: More than 4 times per year    Active Member of Clubs or Organizations: No    Attends Archivist Meetings: Never    Marital Status: Widowed  Intimate Partner Violence: Not At Risk (08/31/2019)   Humiliation, Afraid, Rape, and Kick questionnaire    Fear of Current or Ex-Partner: No    Emotionally Abused: No    Physically Abused: No    Sexually Abused: No     Family History  Problem Relation Age of Onset   Hypertension Mother    Stroke Mother    Diabetes Sister    Hypertension Sister    Diabetes Brother    Diabetes Brother      Current Outpatient Medications:    albuterol (PROVENTIL HFA;VENTOLIN HFA) 108 (90 Base) MCG/ACT inhaler, Inhale 2 puffs into the lungs every 6 (six) hours as needed for wheezing or shortness of breath.,  Disp: 1 Inhaler, Rfl: 12   amLODipine (NORVASC) 10 MG tablet, Take 10 mg by mouth daily., Disp: , Rfl:    aspirin 81 MG chewable tablet, Chew by mouth daily., Disp: , Rfl:    Coenzyme Q10 (CO Q 10 PO), Take by mouth., Disp: , Rfl:    fluticasone (FLONASE) 50 MCG/ACT nasal spray, Place 1 spray into both nostrils as needed. , Disp: , Rfl:    losartan (COZAAR) 50 MG tablet, Take 1 tablet by mouth daily., Disp: , Rfl:    LUBRICATING PLUS EYE DROPS 0.5 % SOLN, Apply to eye., Disp: , Rfl:    meclizine (ANTIVERT) 25 MG tablet, Take 1 tablet (25 mg total) by mouth as needed for dizziness., Disp: 45 tablet, Rfl: 1   metFORMIN (GLUCOPHAGE) 1000 MG tablet, Take 1 tablet (1,000 mg total) by mouth 2 (two) times daily with a meal., Disp: 180 tablet, Rfl: 0   mirtazapine (REMERON SOL-TAB) 30 MG disintegrating tablet, Take 1 tablet (30 mg total) by mouth at bedtime., Disp: 30 tablet, Rfl: 11   Multiple Vitamins-Minerals (MENS MULTIVITAMIN PLUS PO), Take by mouth daily., Disp: , Rfl:    omeprazole (PRILOSEC) 20 MG capsule, Take 1 capsule (20 mg total) by mouth daily., Disp: 30 capsule, Rfl: 5   simvastatin (ZOCOR) 20 MG tablet, Take 0.5 tablets by mouth at bedtime., Disp: , Rfl:    sucralfate (CARAFATE) 1 g tablet, Take 1 tablet (1 g total) by mouth 4 (four) times daily -  with meals and at bedtime., Disp: 120 tablet, Rfl: 5   tiotropium (SPIRIVA) 18 MCG inhalation capsule, Place 18 mcg into inhaler and inhale every morning., Disp: , Rfl:    triamcinolone ointment (KENALOG) 0.1 %, APPLY TOPICALLY TO AFFECTED AREAS WITH RASH ONCE TO TWICE DAILY AS NEEDED. AVOID FACE, GROIN, AND AXILLA., Disp: 454 g, Rfl: 0   WIXELA INHUB 250-50 MCG/ACT AEPB, Inhale 1 puff into the lungs 2 (two) times daily., Disp: , Rfl:    Physical exam:  Vitals:   09/26/21 1109  BP: 111/62  Pulse: 76  Resp: 16  Temp: 98.6 F (37 C)  SpO2: 98%  Weight: 148 lb 1.6 oz (67.2 kg)   Physical Exam Constitutional:      General: He is not in  acute distress. Eyes:     General: No scleral icterus. Cardiovascular:     Rate and Rhythm: Normal rate and regular rhythm.     Heart sounds: Normal heart sounds.  Pulmonary:     Effort: Pulmonary effort is  normal.     Breath sounds: Normal breath sounds.  Abdominal:     General: Bowel sounds are normal.     Palpations: Abdomen is soft.     Comments: Mild tenderness to palpation in the epigastrium  Skin:    General: Skin is warm and dry.  Neurological:     Mental Status: He is alert and oriented to person, place, and time.           Latest Ref Rng & Units 09/23/2021   11:12 AM  CMP  Glucose 70 - 99 mg/dL 163   BUN 10 - 36 mg/dL 12   Creatinine 0.76 - 1.27 mg/dL 0.69   Sodium 134 - 144 mmol/L 143   Potassium 3.5 - 5.2 mmol/L 4.1   Chloride 96 - 106 mmol/L 102   CO2 20 - 29 mmol/L 22   Calcium 8.6 - 10.2 mg/dL 10.1   Total Protein 6.0 - 8.5 g/dL 7.3   Total Bilirubin 0.0 - 1.2 mg/dL 3.5   Alkaline Phos 44 - 121 IU/L 353   AST 0 - 40 IU/L 121   ALT 0 - 44 IU/L 300       Latest Ref Rng & Units 09/23/2021   11:12 AM  CBC  WBC 3.4 - 10.8 x10E3/uL 10.6   Hemoglobin 13.0 - 17.7 g/dL 12.9   Hematocrit 37.5 - 51.0 % 39.5   Platelets 150 - 450 x10E3/uL 262     No images are attached to the encounter.  DG C-Arm 1-60 Min-No Report  Result Date: 09/18/2021 Fluoroscopy was utilized by the requesting physician.  No radiographic interpretation.   MR ABDOMEN MRCP W WO CONTAST  Result Date: 09/18/2021 CLINICAL DATA:  Elevated bilirubin low elevated bilirubin. Jaundice. Unintentional weight loss since November. EXAM: MRI ABDOMEN WITHOUT AND WITH CONTRAST (INCLUDING MRCP) TECHNIQUE: Multiplanar multisequence MR imaging of the abdomen was performed both before and after the administration of intravenous contrast. Heavily T2-weighted images of the biliary and pancreatic ducts were obtained, and three-dimensional MRCP images were rendered by post processing. CONTRAST:  42m GADAVIST  GADOBUTROL 1 MMOL/ML IV SOLN COMPARISON:  09/01/2021 abdominal ultrasound. FINDINGS: Lower chest: Normal heart size without pericardial or pleural effusion. Hepatobiliary: Tiny liver lesions, including 4 mm well-circumscribed hypoenhancing lesion in the anterior right hepatic lobe on 30/20 and is somewhat more ill-defined hypoenhancing lesion of 8 mm within the high left hepatic lobe on 33/20. These are relatively occult prior to contrast, and without correlate restricted diffusion. Mild gallbladder distension. Moderate intrahepatic biliary duct dilatation. Common duct is upper normal caliber for age including at 9 mm on 44/24. Obstructed by the pancreatic mass detailed below. Pancreas: Pancreatic atrophy involving the body and tail. Moderate pancreatic duct dilatation. This is followed to the level of a head/uncinate process hypoenhancing mass which measures 2.6 x 2.6 cm on 51/20, 2.4 cm craniocaudal on image 46/24. No superimposed acute pancreatitis. Spleen: Demonstrates a subcapsular T2 hyperintense 2.4 cm hypoenhancing lesion which is most likely a benign entity such as a lymphangioma. A smaller, similar morphology lesion is identified more laterally at 7 mm. Adrenals/Urinary Tract: Normal adrenal glands. Minimal adrenal nodularity of approximately 8 mm, favoring a small adenoma. Normal kidneys, without hydronephrosis. Stomach/Bowel: Large colonic stool burden. Normal stomach and small bowel. Vascular/Lymphatic: Aortic atherosclerosis. The SMA is contacted by tumor over an approximately 90 degrees span on 51/20. The SMV is contacted by tumor over an approximately 100 degrees span on 51/20. Celiac, portal vein, splenic vein uninvolved. No retroperitoneal  or retrocrural adenopathy. Other:  No ascites.  No evidence of omental or peritoneal disease. Musculoskeletal: Convex right thoracolumbar spine curvature. IMPRESSION: 1. Pancreatic head mass, consistent with adenocarcinoma. This causes pancreatic and biliary duct  dilatation, as detailed above. 2. Contact of tumor with the SMA and SMV, as detailed above. 3. Tiny liver lesions which are too small to characterize. Early metastasis cannot be excluded. Recommend attention on follow-up. Electronically Signed   By: Abigail Miyamoto M.D.   On: 09/18/2021 10:53   MR 3D Recon At Scanner  Result Date: 09/18/2021 CLINICAL DATA:  Elevated bilirubin low elevated bilirubin. Jaundice. Unintentional weight loss since November. EXAM: MRI ABDOMEN WITHOUT AND WITH CONTRAST (INCLUDING MRCP) TECHNIQUE: Multiplanar multisequence MR imaging of the abdomen was performed both before and after the administration of intravenous contrast. Heavily T2-weighted images of the biliary and pancreatic ducts were obtained, and three-dimensional MRCP images were rendered by post processing. CONTRAST:  27m GADAVIST GADOBUTROL 1 MMOL/ML IV SOLN COMPARISON:  09/01/2021 abdominal ultrasound. FINDINGS: Lower chest: Normal heart size without pericardial or pleural effusion. Hepatobiliary: Tiny liver lesions, including 4 mm well-circumscribed hypoenhancing lesion in the anterior right hepatic lobe on 30/20 and is somewhat more ill-defined hypoenhancing lesion of 8 mm within the high left hepatic lobe on 33/20. These are relatively occult prior to contrast, and without correlate restricted diffusion. Mild gallbladder distension. Moderate intrahepatic biliary duct dilatation. Common duct is upper normal caliber for age including at 9 mm on 44/24. Obstructed by the pancreatic mass detailed below. Pancreas: Pancreatic atrophy involving the body and tail. Moderate pancreatic duct dilatation. This is followed to the level of a head/uncinate process hypoenhancing mass which measures 2.6 x 2.6 cm on 51/20, 2.4 cm craniocaudal on image 46/24. No superimposed acute pancreatitis. Spleen: Demonstrates a subcapsular T2 hyperintense 2.4 cm hypoenhancing lesion which is most likely a benign entity such as a lymphangioma. A smaller,  similar morphology lesion is identified more laterally at 7 mm. Adrenals/Urinary Tract: Normal adrenal glands. Minimal adrenal nodularity of approximately 8 mm, favoring a small adenoma. Normal kidneys, without hydronephrosis. Stomach/Bowel: Large colonic stool burden. Normal stomach and small bowel. Vascular/Lymphatic: Aortic atherosclerosis. The SMA is contacted by tumor over an approximately 90 degrees span on 51/20. The SMV is contacted by tumor over an approximately 100 degrees span on 51/20. Celiac, portal vein, splenic vein uninvolved. No retroperitoneal or retrocrural adenopathy. Other:  No ascites.  No evidence of omental or peritoneal disease. Musculoskeletal: Convex right thoracolumbar spine curvature. IMPRESSION: 1. Pancreatic head mass, consistent with adenocarcinoma. This causes pancreatic and biliary duct dilatation, as detailed above. 2. Contact of tumor with the SMA and SMV, as detailed above. 3. Tiny liver lesions which are too small to characterize. Early metastasis cannot be excluded. Recommend attention on follow-up. Electronically Signed   By: KAbigail MiyamotoM.D.   On: 09/18/2021 10:53   NM Hepato W/EjeCT Fract  Result Date: 09/17/2021 CLINICAL DATA:  Elevated bilirubin.  Abdominal pain. EXAM: NUCLEAR MEDICINE HEPATOBILIARY IMAGING TECHNIQUE: Sequential images of the abdomen were obtained out to 60 minutes following intravenous administration of radiopharmaceutical. RADIOPHARMACEUTICALS:  5.3 mCi Tc-932mCholetec IV COMPARISON:  None Available. FINDINGS: Uniform uptake of radiotracer from the blood pool into the liver. No counts are evident within the common bile duct over 90 minutes of imaging. Gallbladder filling cannot be assessed without extrahepatic biliary excretion. IMPRESSION: No biliary excretion into the extrahepatic ducts identified. Differential includes extrahepatic biliary occlusion versus hepatitis. Favor common bile duct occlusion. Consider MRCP for  further evaluation. These  results will be called to the ordering clinician or representative by the Radiologist Assistant, and communication documented in the PACS or [employer]. Electronically Signed   By: Suzy Bouchard M.D.   On: 09/17/2021 14:00   US Abdomen Limited RUQ (LIVER/GB)  Result Date: 09/01/2021 CLINICAL DATA:  Abdominal pain for 1.5 weeks EXAM: ULTRASOUND ABDOMEN LIMITED RIGHT UPPER QUADRANT COMPARISON:  Abdomen x-ray May 22, 2011 FINDINGS: Gallbladder: No wall thickening visualized. Sludge is noted in the gallbladder. No sonographic Murphy sign noted by sonographer. Common bile duct: Diameter: 7 mm Liver: No focal lesion identified. Within normal limits in parenchymal echogenicity. Portal vein is patent on color Doppler imaging with normal direction of blood flow towards the liver. Other: None. IMPRESSION: Sludge in the gallbladder. No sonographic evidence of acute cholecystitis. Electronically Signed   By: Abelardo Diesel M.D.   On: 09/01/2021 08:47    Assessment and plan- Patient is a 88 y.o. male referred for pancreatic head mass and obstructive jaundice  I have reviewed MRI with MRCP images independently and discussed findings with the patient and his daughter.MRI shows a pancreatic head mass measuring 2.6 x 2.6 cm which is likely the cause of his obstructive jaundice.  Also shows 2 other indeterminate subcentimeter liver lesions and it is unclear if they represent metastatic disease or not.  I recommend getting a PET scan to complete his staging work-up as this very likely appears to be pancreatic cancer.  We will obtain baseline labs CBC with differential CMP CA 19-9 today.  I will also refer him to advanced GI to proceed with EUS for tissue sampling.  Hopefully PET CT scan can tell us if the liver lesions appear concerning.  MRI also shows SMV involvement.  Therefore even if patient does not have metastatic disease I do not think that he would be upfront resectable based on that with pancreatic  cancer.  It is very likely that patient will require chemotherapy either neoadjuvant or with a palliative intent after EUS and PET scan results are back.  Patient understands all of this and is willing to consider chemotherapy if need be.  Obstructive jaundice: Improved after ERCP.  Bilirubin is down to 3.  AST ALT and alkaline phosphatase are still elevated.  We will repeat labs today.  Likely neoplasm related pain: There is a potential interaction with tramadol and mirtazapine.  I am therefore sending a prescription for oxycodone 5 mg every 8 hours as needed there would be a role for genetic counseling down the line.   Thank you for this kind referral and the opportunity to participate in the care of this patient   Visit Diagnosis 1. Pancreatic mass   2. Obstructive jaundice     Dr. Randa Evens, MD, MPH [facility] [phone] 09/26/2021

## 2021-09-26 NOTE — Telephone Encounter (Signed)
Called and notified daughter that due to a potential reaction between tramadol and mirtazapine, oxycodone has been sent in instead. Instructed on use as ordered.

## 2021-09-26 NOTE — Progress Notes (Signed)
Provided printed copy of instructions for EUS and reviewed them in detail. Denies anticoagulants. Directed to hold Metformin the day of EUS.

## 2021-09-27 LAB — CANCER ANTIGEN 19-9: CA 19-9: 6 U/mL (ref 0–35)

## 2021-09-29 ENCOUNTER — Other Ambulatory Visit: Payer: Self-pay

## 2021-10-02 ENCOUNTER — Ambulatory Visit
Admission: RE | Admit: 2021-10-02 | Discharge: 2021-10-02 | Disposition: A | Discharge status: Home / Self Care | Payer: Medicare Other | Source: Ambulatory Visit | Attending: Gastroenterology | Admitting: Gastroenterology

## 2021-10-02 ENCOUNTER — Ambulatory Visit: Payer: Medicare Other | Admitting: General Practice

## 2021-10-02 ENCOUNTER — Encounter
Admission: RE | Disposition: A | Discharge status: Home / Self Care | Payer: Self-pay | Source: Ambulatory Visit | Attending: Gastroenterology

## 2021-10-02 ENCOUNTER — Other Ambulatory Visit: Payer: Medicare Other

## 2021-10-02 ENCOUNTER — Encounter: Payer: Self-pay | Admitting: *Deleted

## 2021-10-02 ENCOUNTER — Other Ambulatory Visit: Payer: Self-pay

## 2021-10-02 DIAGNOSIS — Z7984 Long term (current) use of oral hypoglycemic drugs: Secondary | ICD-10-CM | POA: Insufficient documentation

## 2021-10-02 DIAGNOSIS — J449 Chronic obstructive pulmonary disease, unspecified: Secondary | ICD-10-CM | POA: Diagnosis not present

## 2021-10-02 DIAGNOSIS — E7800 Pure hypercholesterolemia, unspecified: Secondary | ICD-10-CM | POA: Insufficient documentation

## 2021-10-02 DIAGNOSIS — I10 Essential (primary) hypertension: Secondary | ICD-10-CM | POA: Insufficient documentation

## 2021-10-02 DIAGNOSIS — K869 Disease of pancreas, unspecified: Secondary | ICD-10-CM | POA: Diagnosis not present

## 2021-10-02 DIAGNOSIS — C250 Malignant neoplasm of head of pancreas: Secondary | ICD-10-CM | POA: Diagnosis not present

## 2021-10-02 DIAGNOSIS — Z87891 Personal history of nicotine dependence: Secondary | ICD-10-CM | POA: Insufficient documentation

## 2021-10-02 DIAGNOSIS — E1140 Type 2 diabetes mellitus with diabetic neuropathy, unspecified: Secondary | ICD-10-CM | POA: Diagnosis not present

## 2021-10-02 DIAGNOSIS — K8689 Other specified diseases of pancreas: Secondary | ICD-10-CM | POA: Diagnosis present

## 2021-10-02 HISTORY — PX: EUS: SHX5427

## 2021-10-02 LAB — GLUCOSE, CAPILLARY: Glucose-Capillary: 121 mg/dL — ABNORMAL HIGH (ref 70–99)

## 2021-10-02 SURGERY — UPPER ENDOSCOPIC ULTRASOUND (EUS) LINEAR
Anesthesia: General

## 2021-10-02 MED ORDER — SODIUM CHLORIDE 0.9 % IV SOLN
INTRAVENOUS | Status: DC
Start: 2021-10-02 — End: 2021-10-02

## 2021-10-02 MED ORDER — PROPOFOL 500 MG/50ML IV EMUL
INTRAVENOUS | Status: DC | PRN
  Administered 2021-10-02: 60 ug/kg/min via INTRAVENOUS

## 2021-10-02 MED ORDER — PROPOFOL 10 MG/ML IV BOLUS
INTRAVENOUS | Status: DC | PRN
  Administered 2021-10-02: 30 mg via INTRAVENOUS
  Administered 2021-10-02: 10 mg via INTRAVENOUS

## 2021-10-02 NOTE — Anesthesia Preprocedure Evaluation (Signed)
Anesthesia Evaluation  Patient identified by MRN, date of birth, ID band Patient awake    Reviewed: Allergy & Precautions, NPO status , Patient's Chart, lab work & pertinent test results  Airway Mallampati: III  TM Distance: >3 FB Neck ROM: full    Dental  (+) Lower Dentures, Upper Dentures   Pulmonary neg pulmonary ROS, COPD,  COPD inhaler, former smoker,    Pulmonary exam normal        Cardiovascular hypertension, (-) angina(-) Past MI negative cardio ROS Normal cardiovascular exam     Neuro/Psych PSYCHIATRIC DISORDERS Anxiety negative neurological ROS  negative psych ROS   GI/Hepatic negative GI ROS, Neg liver ROS,   Endo/Other  negative endocrine ROSdiabetes  Renal/GU      Musculoskeletal   Abdominal   Peds  Hematology negative hematology ROS (+)   Anesthesia Other Findings Past Medical History: 07/12/2014: Adiposity 02/11/2007: Allergic rhinitis 02/11/2007: Arthritis, degenerative 02/11/2007: Benign essential HTN     Comment:  Goal BP< 130/80 due DMII.  07/12/2014: CA of prostate ([place]) 07/12/2014: Carpal tunnel syndrome No date: Diabetes mellitus without complication ([place]) 84/69/6295: Diabetic neuropathy ([place])     Comment:  s/p Lifestyle Education Diabetic Classes 2012.                Monofilament intact 09/18/2013-normal bilaterally.  01/23/2013: ED (erectile dysfunction) of organic origin 07/12/2014: Epididymitis 01/23/2013: Excessive urination at night 02/11/2007: Hypercholesterolemia without hypertriglyceridemia No date: Hypertension 07/12/2014: Neurosis, posttraumatic     Comment:  ([place]) and ([place]).  02/11/2007: OP (osteoporosis) No date: Pancreatic mass  Past Surgical History: No date: CATARACT EXTRACTION 09/18/2021: ERCP; N/A     Comment:  Procedure: ENDOSCOPIC RETROGRADE               CHOLANGIOPANCREATOGRAPHY (ERCP);  Surgeon: Lucilla Lame,               MD;  Location[facility]  ENDOSCOPY;  Service: Endoscopy;                Laterality: N/A; No date: KNEE ARTHROSCOPY     Comment:  left No date: MULTIPLE TOOTH EXTRACTIONS No date: PROSTATECTOMY No date: ROTATOR CUFF REPAIR     Reproductive/Obstetrics negative OB ROS                             Anesthesia Physical  Anesthesia Plan  ASA: 3  Anesthesia Plan: General   Post-op Pain Management:    Induction: Intravenous  PONV Risk Score and Plan: 2 and Ondansetron, Dexamethasone, Midazolam and Treatment may vary due to age or medical condition  Airway Management Planned: Oral ETT  Additional Equipment:   Intra-op Plan:   Post-operative Plan: Extubation in OR  Informed Consent: I have reviewed the patients History and Physical, chart, labs and discussed the procedure including the risks, benefits and alternatives for the proposed anesthesia with the patient or authorized representative who has indicated his/her understanding and acceptance.     Dental Advisory Given  Plan Discussed with: Anesthesiologist, CRNA and Surgeon  Anesthesia Plan Comments: (Patient consented for risks of anesthesia including but not limited to:  - adverse reactions to medications - damage to eyes, teeth, lips or other oral mucosa - nerve damage due to positioning  - sore throat or hoarseness - Damage to heart, brain, nerves, lungs, other parts of body or loss of life  Patient voiced understanding.)        Anesthesia Quick Evaluation

## 2021-10-02 NOTE — Op Note (Signed)
[facility] Patient Name: Michael Murphy Procedure Date: 10/02/2021 1:31 PM MRN: [id number] Account #: [id number] Date of Birth: 12-20-1927 Admit Type: Outpatient Age: 88 Room: [facility] ENDO ROOM 3 Gender: Male Note Status: Finalized Instrument Name: Linear EUS Scope [id number] Procedure:             Upper EUS Indications:           Suspected mass in pancreas on CT scan Patient Profile:       Refer to note in patient chart for documentation of                         history and physical. Providers:             Lenetta Quaker. Cephas Darby, MD Referring MD:          Janine Ores. Rosanna Randy, MD (Referring MD), Randa Evens                         (Referring MD), Lendon Collar (Referring MD) Medicines:             Monitored Anesthesia Care Complications:         No immediate complications. Procedure:             Pre-Anesthesia Assessment:                        - Monitored anesthesia care under the supervision of a                         CRNA was determined to be medically necessary for this                         procedure based on complex procedure (ERCP, EUS).                        After obtaining informed consent, the endoscope was                         passed under direct vision. Throughout the procedure,                         the patient's blood pressure, pulse, and oxygen                         saturations were monitored continuously. The Endoscope                         was introduced through the mouth, and advanced to the                         duodenum for ultrasound examination from the                         esophagus, stomach and duodenum. Findings:      ENDOSCOPIC FINDING: :      The examined esophagus was endoscopically normal.      The entire examined stomach was endoscopically normal.      A previously placed metal stent was seen in the ampulla.      ENDOSONOGRAPHIC FINDING: :      Three  malignant-appearing lymph nodes were visualized in the  celiac       region (level 20). The largest measured 6 mm by 5 mm in maximal       cross-sectional diameter. The nodes were round, hypoechoic and had well       defined margins. Fine needle biopsy was performed. Color Doppler imaging       was utilized prior to needle puncture to confirm a lack of significant       vascular structures within the needle path. Four passes were made with       the 25 gauge SharkCore biopsy needle using a transgastric approach. A       visible core of tissue was obtained. Preliminary cytologic examination       and touch preps were performed. The cellularity of the specimen was       adequate. Final cytology results are pending.      A round mass was identified in the pancreatic head. The mass was       hypoechoic. The mass measured 23 mm by 32 mm in maximal cross-sectional       diameter. The outer margins were irregular. There was sonographic       evidence suggesting invasion into the superior mesenteric vein       (manifested by interface loss greater than or equal to 15 mm). An intact       interface was seen between the mass and the adjacent structures       suggesting a lack of invasion. The remainder of the pancreas was       examined. The endosonographic appearance of parenchyma and the upstream       pancreatic duct indicated duct dilation and parenchymal atrophy. The PD       measured 8.5 mm in the neck, 5.2 mm in the body and 5.7 mm in the tail.       Fine needle biopsy was performed. Color Doppler imaging was utilized       prior to needle puncture to confirm a lack of significant vascular       structures within the needle path. Four passes were made with the 25       gauge SharkCore biopsy needle using a transduodenal approach. A visible       core of tissue was obtained. Preliminary cytologic examination and touch       preps were performed. The cellularity of the specimen was adequate.       Final cytology results are pending.      One stent  was visualized endosonographically in the main bile duct and       in the common hepatic duct. Extension of the stent was noted in the       common hepatic duct.      Endosonographic imaging in the left lobe of the liver showed no clear       mass lesion. Impression:            EGD Impression:                        - Normal esophagus.                        - Normal stomach.                        - Metal stent at the  ampulla.                        EUS Impression:                        - Three malignant-appearing lymph nodes were                         visualized in the celiac region (level 20). Tissue was                         obtained from this exam, and results are pending.                         However, the endosonographic appearance is highly                         suspicious for metastatic pancreatic adenocarcinoma.                         Fine needle biopsy performed.                        - A mass was identified in the pancreatic head. Tissue                         was obtained from this exam, and results are pending.                         However, the endosonographic appearance is highly                         suspicious for adenocarcinoma. Fine needle biopsy                         performed. Per NCCN guidelines please refer to CT                         imaging for pancreatic staging purposes.                        - One stent was visualized endosonographically in the                         entire main bile duct and in the common hepatic duct.                        - No clear evidence of metastatic disease to the left                         lobe of the liver based upon EUS however pneumobilia                         may obscur a small lesion. Recommendation:        - Discharge patient to home.                        - Await cytology results.                        -  Return to referring physician as previously                         scheduled.                         - The findings and recommendations were discussed with                         the patient and their family. Attending Participation:      I personally performed the entire procedure. Dr. Lenetta Quaker. Cephas Darby, MD Lenetta Quaker. Haneef Hallquist, MD 10/02/2021 2:56:08 PM This report has been signed electronically. Number of Addenda: 0 Note Initiated On: 10/02/2021 1:31 PM Estimated Blood Loss:  Estimated blood loss was minimal.      [facility]

## 2021-10-02 NOTE — H&P (Signed)
PRE-PROCEDURE HISTORY AND PHYSICAL   Riley Hallum presents for his scheduled Procedure(s): UPPER ENDOSCOPIC ULTRASOUND (EUS) LINEAR.  The indication for the procedure(s) is Pancreas mass.  There have been no significant recent changes in the patient's medical status.  Past Medical History:  Diagnosis Date   Adiposity 07/12/2014   Allergic rhinitis 02/11/2007   Arthritis, degenerative 02/11/2007   Benign essential HTN 02/11/2007   Goal BP< 130/80 due DMII.    CA of prostate ([place]) 07/12/2014   Carpal tunnel syndrome 07/12/2014   Diabetes mellitus without complication ([place])    Diabetic neuropathy ([place]) 02/12/2009   s/p Lifestyle Education Diabetic Classes 2012.  Monofilament intact 09/18/2013-normal bilaterally.    ED (erectile dysfunction) of organic origin 01/23/2013   Epididymitis 07/12/2014   Excessive urination at night 01/23/2013   Hypercholesterolemia without hypertriglyceridemia 02/11/2007   Hypertension    Neurosis, posttraumatic 07/12/2014   ([place] [facility]) and ([place]).    OP (osteoporosis) 02/11/2007   Pancreatic mass     Past Surgical History:  Procedure Laterality Date   CATARACT EXTRACTION     ERCP N/A 09/18/2021   Procedure: ENDOSCOPIC RETROGRADE CHOLANGIOPANCREATOGRAPHY (ERCP);  Surgeon: Lucilla Lame, MD;  Location: [facility] ENDOSCOPY;  Service: Endoscopy;  Laterality: N/A;   KNEE ARTHROSCOPY     left   MULTIPLE TOOTH EXTRACTIONS     PROSTATECTOMY     ROTATOR CUFF REPAIR      Allergies Allergies  Allergen Reactions   Accupril [Quinapril Hcl] Anaphylaxis   Quinapril Swelling   Flunisolide     Other reaction(s): Burning sensation of nose   Diclofenac Rash    Medications Carboxymethylcellulose Sod PF, Coenzyme Q10, Multiple Vitamins-Minerals, albuterol, amLODipine, aspirin, fluticasone, fluticasone-salmeterol, losartan, meclizine, metFORMIN, miconazole, mirtazapine, omeprazole, oxyCODONE, simvastatin, sucralfate, tiotropium, and triamcinolone  ointment  Physical Examination  Body mass index is 22.81 kg/m. BP (!) 153/89   Pulse 78   Temp (!) 97.2 F (36.2 C) (Temporal)   Resp 16   Ht '5\' 8"'$  (1.727 m)   Wt 68 kg   SpO2 99%   BMI 22.81 kg/m  General:   Alert,  pleasant and cooperative in NAD Head:  Normocephalic and atraumatic. Neck:  Supple; no masses or thyromegaly. Lungs: No audible wheezes Heart:  Regular rate  Abdomen:  Soft, nontender and nondistended.  Neurologic:  Alert and  oriented x4;  grossly normal neurologically.  ASSESSMENT AND PLAN  Mr. Gladney has been evaluated and deemed appropriate to undergo the planned Procedure(s): UPPER ENDOSCOPIC ULTRASOUND (EUS) LINEAR.

## 2021-10-02 NOTE — Progress Notes (Signed)
Tumor Board Documentation  Michael Murphy was presented by Dr Cephas Darby at our Tumor Board on 10/02/2021, which included representatives from surgical, pharmacy, radiology, genetics, pathology, research, navigation, radiation oncology, internal medicine.  Michael Murphy currently presents as a new patient, for discussion with history of the following treatments: active survellience.  Additionally, we reviewed previous medical and familial history, history of present illness, and recent lab results along with all available histopathologic and imaging studies. The tumor board considered available treatment options and made the following recommendations: Biopsy (EUS)    The following procedures/referrals were also placed: No orders of the defined types were placed in this encounter.   Clinical Trial Status: not discussed   Staging used: To be determined  National site-specific guidelines   were discussed with respect to the case.  Tumor board is a meeting of clinicians from various specialty areas who evaluate and discuss patients for whom a multidisciplinary approach is being considered. Final determinations in the plan of care are those of the provider(s). The responsibility for follow up of recommendations given during tumor board is that of the provider.   Today's extended care, comprehensive team conference, Michael Murphy was not present for the discussion and was not examined.   Multidisciplinary Tumor Board is a multidisciplinary case peer review process.  Decisions discussed in the Multidisciplinary Tumor Board reflect the opinions of the specialists present at the conference without having examined the patient.  Ultimately, treatment and diagnostic decisions rest with the primary provider(s) and the patient.

## 2021-10-02 NOTE — Transfer of Care (Signed)
Immediate Anesthesia Transfer of Care Note  Patient: Michael Murphy  Procedure(s) Performed: UPPER ENDOSCOPIC ULTRASOUND (EUS) LINEAR  Patient Location: Endoscopy Unit  Anesthesia Type:General  Level of Consciousness: awake and patient cooperative  Airway & Oxygen Therapy: Patient Spontanous Breathing  Post-op Assessment: Report given to RN and Post -op Vital signs reviewed and stable  Post vital signs: Reviewed and stable  Last Vitals:  Vitals Value Taken Time  BP    Temp    Pulse    Resp    SpO2      Last Pain:  Vitals:   10/02/21 1313  TempSrc: Temporal  PainSc: 0-No pain         Complications: No notable events documented.

## 2021-10-03 ENCOUNTER — Encounter: Payer: Self-pay | Admitting: Gastroenterology

## 2021-10-03 LAB — CYTOLOGY - NON PAP

## 2021-10-03 NOTE — Anesthesia Postprocedure Evaluation (Signed)
Anesthesia Post Note  Patient: [name]  Procedure(s) Performed: UPPER ENDOSCOPIC ULTRASOUND (EUS) LINEAR  Patient location during evaluation: Endoscopy Anesthesia Type: General Level of consciousness: awake and alert Pain management: pain level controlled Vital Signs Assessment: post-procedure vital signs reviewed and stable Respiratory status: spontaneous breathing, nonlabored ventilation, respiratory function stable and patient connected to nasal cannula oxygen Cardiovascular status: blood pressure returned to baseline and stable Postop Assessment: no apparent nausea or vomiting Anesthetic complications: no   No notable events documented.   Last Vitals:  Vitals:   10/02/21 1502 10/02/21 1512  BP: 127/79 (!) 146/81  Pulse: 82   Resp: (!) 21 19  Temp:    SpO2: 98%     Last Pain:  Vitals:   10/03/21 0740  TempSrc:   PainSc: 0-No pain                 Dimas Millin

## 2021-10-09 ENCOUNTER — Ambulatory Visit: Payer: Medicare Other | Admitting: Oncology

## 2021-10-09 ENCOUNTER — Encounter
Admission: RE | Admit: 2021-10-09 | Discharge: 2021-10-09 | Disposition: A | Discharge status: Home / Self Care | Payer: Medicare Other | Source: Ambulatory Visit | Attending: Oncology | Admitting: Oncology

## 2021-10-09 DIAGNOSIS — Z8546 Personal history of malignant neoplasm of prostate: Secondary | ICD-10-CM | POA: Diagnosis not present

## 2021-10-09 DIAGNOSIS — E042 Nontoxic multinodular goiter: Secondary | ICD-10-CM | POA: Insufficient documentation

## 2021-10-09 DIAGNOSIS — C259 Malignant neoplasm of pancreas, unspecified: Secondary | ICD-10-CM | POA: Diagnosis not present

## 2021-10-09 DIAGNOSIS — K8689 Other specified diseases of pancreas: Secondary | ICD-10-CM | POA: Insufficient documentation

## 2021-10-09 LAB — GLUCOSE, CAPILLARY: Glucose-Capillary: 135 mg/dL — ABNORMAL HIGH (ref 70–99)

## 2021-10-09 MED ORDER — FLUDEOXYGLUCOSE F - 18 (FDG) INJECTION
7.7000 | Freq: Once | INTRAVENOUS | Status: AC | PRN
  Administered 2021-10-09: 8.28 via INTRAVENOUS

## 2021-10-13 ENCOUNTER — Other Ambulatory Visit: Payer: Self-pay | Admitting: Family Medicine

## 2021-10-13 ENCOUNTER — Inpatient Hospital Stay: Payer: Medicare Other | Attending: Oncology | Admitting: Internal Medicine

## 2021-10-13 DIAGNOSIS — C250 Malignant neoplasm of head of pancreas: Secondary | ICD-10-CM | POA: Insufficient documentation

## 2021-10-13 DIAGNOSIS — K5903 Drug induced constipation: Secondary | ICD-10-CM | POA: Diagnosis not present

## 2021-10-13 DIAGNOSIS — E119 Type 2 diabetes mellitus without complications: Secondary | ICD-10-CM

## 2021-10-13 NOTE — Progress Notes (Signed)
[place] NOTE  Patient Care Team: Jerrol Banana., MD as PCP - General (Family Medicine) Kate Sable, MD as PCP - Cardiology (Cardiology) Sharlotte Alamo, DPM as Consulting Physician (Podiatry) Lendon Collar, MD as Referring Physician (Family Medicine) Clent Jacks, RN as Oncology Nurse Navigator Cammie Sickle, MD as Consulting Physician (Oncology)  CHIEF COMPLAINTS/PURPOSE OF CONSULTATION: Pancreatic cancer  Oncology History Overview Note  DIAGNOSIS:  A. PANCREATIC HEAD MASS; EUS GUIDED FINE-NEEDLE ASPIRATION:  - POSITIVE FOR MALIGNANCY.  - ADENOCARCINOMA.   Comment:  There is insufficient tissue present within the cellblock preparation  for ancillary testing.    Malignant neoplasm of head of pancreas ([place])  10/13/2021 Initial Diagnosis   Malignant neoplasm of head of pancreas ([place])   10/13/2021 Cancer Staging   Staging form: Exocrine Pancreas, AJCC 8th Edition - Clinical: Stage III (cT4, cN0, cM0) - Signed by Cammie Sickle, MD on 10/13/2021 Total positive nodes: 0    HISTORY OF PRESENTING ILLNESS: Walking with a cane.  Accompanied by his daughter.  Michael Murphy 88 y.o.  male with newly diagnosed pancreatic head mass/obstructive jaundice status post ERCP/stenting; is here to review the results of his EUS/biopsy and also the PET scan.  Patient complains of ongoing fatigue.  Complains of ongoing weight loss.  Poor appetite.   However abdominal pain is improved.  Patient complains of however pain is left lower extremity radiating down his leg.  Also complains of right chest wall pain in the mid scapular region.  No rash noted.  Review of Systems  Constitutional:  Positive for malaise/fatigue and weight loss. Negative for chills, diaphoresis and fever.  HENT:  Negative for nosebleeds and sore throat.   Eyes:  Negative for double vision.  Respiratory:  Negative for cough, hemoptysis, sputum production, shortness of breath  and wheezing.   Cardiovascular:  Negative for chest pain, palpitations, orthopnea and leg swelling.  Gastrointestinal:  Positive for abdominal pain and constipation. Negative for blood in stool, diarrhea, heartburn, melena, nausea and vomiting.  Genitourinary:  Negative for dysuria, frequency and urgency.  Musculoskeletal:  Positive for back pain. Negative for joint pain.  Skin: Negative.  Negative for itching and rash.  Neurological:  Negative for dizziness, tingling, focal weakness, weakness and headaches.  Endo/Heme/Allergies:  Does not bruise/bleed easily.  Psychiatric/Behavioral:  Negative for depression. The patient is not nervous/anxious and does not have insomnia.     MEDICAL HISTORY:  Past Medical History:  Diagnosis Date   Adiposity 07/12/2014   Allergic rhinitis 02/11/2007   Arthritis, degenerative 02/11/2007   Benign essential HTN 02/11/2007   Goal BP< 130/80 due DMII.    CA of prostate ([place]) 07/12/2014   Carpal tunnel syndrome 07/12/2014   Diabetes mellitus without complication ([place])    Diabetic neuropathy ([place]) 02/12/2009   s/p Lifestyle Education Diabetic Classes 2012.  Monofilament intact 09/18/2013-normal bilaterally.    ED (erectile dysfunction) of organic origin 01/23/2013   Epididymitis 07/12/2014   Excessive urination at night 01/23/2013   Hypercholesterolemia without hypertriglyceridemia 02/11/2007   Hypertension    Neurosis, posttraumatic 07/12/2014   ([place] [facility]) and ([place]).    OP (osteoporosis) 02/11/2007   Pancreatic mass     SURGICAL HISTORY: Past Surgical History:  Procedure Laterality Date   CATARACT EXTRACTION     ERCP N/A 09/18/2021   Procedure: ENDOSCOPIC RETROGRADE CHOLANGIOPANCREATOGRAPHY (ERCP);  Surgeon: Lucilla Lame, MD;  Location[facility] ENDOSCOPY;  Service: Endoscopy;  Laterality: N/A;   EUS N/A 10/02/2021  Procedure: UPPER ENDOSCOPIC ULTRASOUND (EUS) LINEAR;  Surgeon: Reita Cliche, MD;  Location: [facility] ENDOSCOPY;  Service:  Gastroenterology;  Laterality: N/A;   KNEE ARTHROSCOPY     left   MULTIPLE TOOTH EXTRACTIONS     PROSTATECTOMY     ROTATOR CUFF REPAIR      SOCIAL HISTORY: Social History   Socioeconomic History   Marital status: Widowed    Spouse name: Not on file   Number of children: 8   Years of education: Not on file   Highest education level: GED or equivalent  Occupational History   Occupation: retired  Tobacco Use   Smoking status: Former    Packs/day: 0.50    Years: 18.00    Total pack years: 9.00    Types: Cigarettes    Quit date: 03/09/1991    Years since quitting: 30.6   Smokeless tobacco: Never  Vaping Use   Vaping Use: Never used  Substance and Sexual Activity   Alcohol use: Yes    Alcohol/week: 1.0 - 3.0 standard drink of alcohol    Types: 1 - 3 Cans of beer per week    Comment: none last 2 months   Drug use: No   Sexual activity: Yes    Birth control/protection: None  Other Topics Concern   Not on file  Social History Narrative   Not on file   Social Determinants of Health   Financial Resource Strain: Low Risk  (08/31/2019)   Overall Financial Resource Strain (CARDIA)    Difficulty of Paying Living Expenses: Not hard at all  Food Insecurity: No Food Insecurity (08/31/2019)   Hunger Vital Sign    Worried About Running Out of Food in the Last Year: Never true    Ran Out of Food in the Last Year: Never true  Transportation Needs: No Transportation Needs (08/31/2019)   PRAPARE - Hydrologist (Medical): No    Lack of Transportation (Non-Medical): No  Physical Activity: Inactive (08/30/2018)   Exercise Vital Sign    Days of Exercise per Week: 0 days    Minutes of Exercise per Session: 0 min  Stress: No Stress Concern Present (08/31/2019)   [place]    Feeling of Stress : Not at all  Social Connections: Moderately Isolated (08/31/2019)   Social Connection and Isolation  Panel [NHANES]    Frequency of Communication with Friends and Family: More than three times a week    Frequency of Social Gatherings with Friends and Family: Once a week    Attends Religious Services: More than 4 times per year    Active Member of [employer] or Organizations: No    Attends Archivist Meetings: Never    Marital Status: Widowed  Intimate Partner Violence: Not At Risk (08/31/2019)   Humiliation, Afraid, Rape, and Kick questionnaire    Fear of Current or Ex-Partner: No    Emotionally Abused: No    Physically Abused: No    Sexually Abused: No    FAMILY HISTORY: Family History  Problem Relation Age of Onset   Hypertension Mother    Stroke Mother    Diabetes Sister    Hypertension Sister    Diabetes Brother    Diabetes Brother    Cancer Neg Hx     ALLERGIES:  is allergic to accupril [quinapril hcl], quinapril, flunisolide, and diclofenac.  MEDICATIONS:  Current Outpatient Medications  Medication Sig Dispense Refill   albuterol (PROVENTIL  HFA;VENTOLIN HFA) 108 (90 Base) MCG/ACT inhaler Inhale 2 puffs into the lungs every 6 (six) hours as needed for wheezing or shortness of breath. 1 Inhaler 12   amLODipine (NORVASC) 10 MG tablet Take 10 mg by mouth daily.     aspirin 81 MG chewable tablet Chew by mouth daily.     Coenzyme Q10 (CO Q 10 PO) Take by mouth.     fluticasone (FLONASE) 50 MCG/ACT nasal spray Place 1 spray into both nostrils as needed.      losartan (COZAAR) 50 MG tablet Take 1 tablet by mouth daily.     LUBRICATING PLUS EYE DROPS 0.5 % SOLN Apply to eye.     meclizine (ANTIVERT) 25 MG tablet Take 1 tablet (25 mg total) by mouth as needed for dizziness. 45 tablet 1   metFORMIN (GLUCOPHAGE) 1000 MG tablet TAKE ONE TABLET BY MOUTH TWICE A DAY WITH A MEAL 180 tablet 3   miconazole (MICOTIN) 2 % powder APPLY SMALL AMOUNT TOPICALLY EVERY DAY FOR FUNGAL INFECTION     mirtazapine (REMERON SOL-TAB) 30 MG disintegrating tablet Take 1 tablet (30 mg total) by  mouth at bedtime. 30 tablet 11   Multiple Vitamins-Minerals (MENS MULTIVITAMIN PLUS PO) Take by mouth daily.     omeprazole (PRILOSEC) 20 MG capsule Take 1 capsule (20 mg total) by mouth daily. 30 capsule 5   oxyCODONE (OXY IR/ROXICODONE) 5 MG immediate release tablet Take 1 tablet (5 mg total) by mouth every 8 (eight) hours as needed for severe pain. 60 tablet 0   simvastatin (ZOCOR) 20 MG tablet Take 0.5 tablets by mouth at bedtime.     simvastatin (ZOCOR) 80 MG tablet Take by mouth.     sucralfate (CARAFATE) 1 g tablet Take 1 tablet (1 g total) by mouth 4 (four) times daily -  with meals and at bedtime. 120 tablet 5   tiotropium (SPIRIVA) 18 MCG inhalation capsule Place 18 mcg into inhaler and inhale every morning.     triamcinolone ointment (KENALOG) 0.1 % APPLY TOPICALLY TO AFFECTED AREAS WITH RASH ONCE TO TWICE DAILY AS NEEDED. AVOID FACE, GROIN, AND AXILLA. 454 g 0   WIXELA INHUB 250-50 MCG/ACT AEPB Inhale 1 puff into the lungs 2 (two) times daily.     No current facility-administered medications for this visit.    PHYSICAL EXAMINATION: ECOG PERFORMANCE STATUS: 1 - Symptomatic but completely ambulatory  Vitals:   10/13/21 1351  BP: 115/73  Pulse: 88  Temp: (!) 97.3 F (36.3 C)  SpO2: 100%   Filed Weights   10/13/21 1351  Weight: 146 lb 12.8 oz (66.6 kg)    Physical Exam Vitals and nursing note reviewed.  HENT:     Head: Normocephalic and atraumatic.     Mouth/Throat:     Pharynx: Oropharynx is clear.  Eyes:     Extraocular Movements: Extraocular movements intact.     Pupils: Pupils are equal, round, and reactive to light.  Cardiovascular:     Rate and Rhythm: Normal rate and regular rhythm.  Pulmonary:     Comments: Decreased breath sounds bilaterally.  Abdominal:     Palpations: Abdomen is soft.  Musculoskeletal:        General: Normal range of motion.     Cervical back: Normal range of motion.  Skin:    General: Skin is warm.  Neurological:     General: No  focal deficit present.     Mental Status: He is alert and oriented to person, place, and  time.  Psychiatric:        Behavior: Behavior normal.        Judgment: Judgment normal.     LABORATORY DATA:  I have reviewed the data as listed Lab Results  Component Value Date   WBC 12.7 (H) 09/26/2021   HGB 13.7 09/26/2021   HCT 41.1 09/26/2021   MCV 86.5 09/26/2021   PLT 317 09/26/2021   Recent Labs    08/27/21 1515 09/17/21 1006 09/23/21 1112 09/26/21 1154  NA 142  --  143 138  K 4.3  --  4.1 4.5  CL 103  --  102 103  CO2 20  --  22 26  GLUCOSE 139*  --  163* 145*  BUN 7*  --  12 17  CREATININE 0.72*  --  0.69* 0.73  CALCIUM 9.8  --  10.1 10.5*  GFRNONAA  --   --   --  >60  PROT 7.4 6.7 7.3 8.1  ALBUMIN 4.8* 4.3 4.5 4.4  AST 196* 309* 121* 62*  ALT 216* 423* 300* 168*  ALKPHOS 342* 530* 353* 252*  BILITOT 1.4* 10.5* 3.5* 3.1*  BILIDIR  --  8.35*  --   --     RADIOGRAPHIC STUDIES: I have personally reviewed the radiological images as listed and agreed with the findings in the report. NM PET Image Restage (PS) Skull Base to Thigh (F-18 FDG)  Result Date: 10/09/2021 CLINICAL DATA:  Initial treatment strategy for pancreatic cancer (adenocarcinoma on biopsy). History of prostate cancer. EXAM: NUCLEAR MEDICINE PET SKULL BASE TO THIGH TECHNIQUE: 8.2 a mCi F-18 FDG was injected intravenously. Full-ring PET imaging was performed from the skull base to thigh after the radiotracer. CT data was obtained and used for attenuation correction and anatomic localization. Fasting blood glucose: 135 mg/dl COMPARISON:  Abdominal MRI 09/18/2021. Abdominal ultrasound 09/01/2021 FINDINGS: Mediastinal blood pool activity: SUV max 1.3 NECK: No hypermetabolic cervical lymph nodes are identified.There are no lesions of the pharyngeal mucosal space. There is an intensely hypermetabolic left thyroid nodule which measures 1.9 cm on image 74/2 (SUV max 5.8). Incidental CT findings: Bilateral carotid  atherosclerosis. CHEST: There are no hypermetabolic mediastinal, hilar or axillary lymph nodes. No hypermetabolic pulmonary activity or suspicious nodularity. Incidental CT findings: Atherosclerosis of the aorta, great vessels and coronary arteries. There are calcifications of the aortic valve. Mild emphysematous changes in both lungs. ABDOMEN/PELVIS: The images through the upper and mid abdomen are significantly degraded by motion artifact. There is only low level metabolic activity within the pancreatic head surrounding the newly placed biliary stent (SUV max 3.0). No hypermetabolic activity is seen within the liver, adrenal glands or spleen. There is atypical prominent activity within the small bowel mesentery (SUV max 6.6) without discrete corresponding enlarged lymph nodes. There is focal hypermetabolic activity in the right pelvis adjacent to the iliac vessels (SUV max 6.6). No obvious corresponding mass on the CT images, and this could relate to a tortuous ureter. No distinct hypermetabolic adenopathy. Incidental CT findings: Diffuse aortic and branch vessel atherosclerosis. As above, interval biliary stent placement with resulting pneumobilia. SKELETON: There is no hypermetabolic activity to suggest osseous metastatic disease. Incidental CT findings: Spondylosis with a convex right thoracolumbar scoliosis. IMPRESSION: 1. Interval biliary stent placement for a pancreatic mass. Evaluation of the pancreatic lesion is limited by breathing artifact. Only low level metabolic activity is seen within the known pancreatic mass (SUV max 3.0). 2. No definite signs of metastatic disease identified. Evaluation limited by motion artifact. 3.  Indeterminate atypical mesenteric activity without discrete nodularity. Nodular activity in the right pelvis has no clear corresponding finding on the CT images (albeit limited by motion) and may relate to the right ureter. Attention on surveillance follow-up abdominopelvic CT  recommended. 4. Significantly hypermetabolic left thyroid lesion. Hypermetabolic thyroid nodules on PET have up to 40-50% incidence of malignancy; recommend further evaluation with thyroid ultrasound and possible US-guided fine needle aspiration. 5. Electronically Signed   By: Richardean Sale M.D.   On: 10/09/2021 16:12   DG C-Arm 1-60 Min-No Report  Result Date: 09/18/2021 Fluoroscopy was utilized by the requesting physician.  No radiographic interpretation.   MR ABDOMEN MRCP W WO CONTAST  Result Date: 09/18/2021 CLINICAL DATA:  Elevated bilirubin low elevated bilirubin. Jaundice. Unintentional weight loss since November. EXAM: MRI ABDOMEN WITHOUT AND WITH CONTRAST (INCLUDING MRCP) TECHNIQUE: Multiplanar multisequence MR imaging of the abdomen was performed both before and after the administration of intravenous contrast. Heavily T2-weighted images of the biliary and pancreatic ducts were obtained, and three-dimensional MRCP images were rendered by post processing. CONTRAST:  56m GADAVIST GADOBUTROL 1 MMOL/ML IV SOLN COMPARISON:  09/01/2021 abdominal ultrasound. FINDINGS: Lower chest: Normal heart size without pericardial or pleural effusion. Hepatobiliary: Tiny liver lesions, including 4 mm well-circumscribed hypoenhancing lesion in the anterior right hepatic lobe on 30/20 and is somewhat more ill-defined hypoenhancing lesion of 8 mm within the high left hepatic lobe on 33/20. These are relatively occult prior to contrast, and without correlate restricted diffusion. Mild gallbladder distension. Moderate intrahepatic biliary duct dilatation. Common duct is upper normal caliber for age including at 9 mm on 44/24. Obstructed by the pancreatic mass detailed below. Pancreas: Pancreatic atrophy involving the body and tail. Moderate pancreatic duct dilatation. This is followed to the level of a head/uncinate process hypoenhancing mass which measures 2.6 x 2.6 cm on 51/20, 2.4 cm craniocaudal on image 46/24. No  superimposed acute pancreatitis. Spleen: Demonstrates a subcapsular T2 hyperintense 2.4 cm hypoenhancing lesion which is most likely a benign entity such as a lymphangioma. A smaller, similar morphology lesion is identified more laterally at 7 mm. Adrenals/Urinary Tract: Normal adrenal glands. Minimal adrenal nodularity of approximately 8 mm, favoring a small adenoma. Normal kidneys, without hydronephrosis. Stomach/Bowel: Large colonic stool burden. Normal stomach and small bowel. Vascular/Lymphatic: Aortic atherosclerosis. The SMA is contacted by tumor over an approximately 90 degrees span on 51/20. The SMV is contacted by tumor over an approximately 100 degrees span on 51/20. Celiac, portal vein, splenic vein uninvolved. No retroperitoneal or retrocrural adenopathy. Other:  No ascites.  No evidence of omental or peritoneal disease. Musculoskeletal: Convex right thoracolumbar spine curvature. IMPRESSION: 1. Pancreatic head mass, consistent with adenocarcinoma. This causes pancreatic and biliary duct dilatation, as detailed above. 2. Contact of tumor with the SMA and SMV, as detailed above. 3. Tiny liver lesions which are too small to characterize. Early metastasis cannot be excluded. Recommend attention on follow-up. Electronically Signed   By: KAbigail MiyamotoM.D.   On: 09/18/2021 10:53   MR 3D Recon At Scanner  Result Date: 09/18/2021 CLINICAL DATA:  Elevated bilirubin low elevated bilirubin. Jaundice. Unintentional weight loss since November. EXAM: MRI ABDOMEN WITHOUT AND WITH CONTRAST (INCLUDING MRCP) TECHNIQUE: Multiplanar multisequence MR imaging of the abdomen was performed both before and after the administration of intravenous contrast. Heavily T2-weighted images of the biliary and pancreatic ducts were obtained, and three-dimensional MRCP images were rendered by post processing. CONTRAST:  625mGADAVIST GADOBUTROL 1 MMOL/ML IV SOLN COMPARISON:  09/01/2021 abdominal ultrasound.  FINDINGS: Lower chest:  Normal heart size without pericardial or pleural effusion. Hepatobiliary: Tiny liver lesions, including 4 mm well-circumscribed hypoenhancing lesion in the anterior right hepatic lobe on 30/20 and is somewhat more ill-defined hypoenhancing lesion of 8 mm within the high left hepatic lobe on 33/20. These are relatively occult prior to contrast, and without correlate restricted diffusion. Mild gallbladder distension. Moderate intrahepatic biliary duct dilatation. Common duct is upper normal caliber for age including at 9 mm on 44/24. Obstructed by the pancreatic mass detailed below. Pancreas: Pancreatic atrophy involving the body and tail. Moderate pancreatic duct dilatation. This is followed to the level of a head/uncinate process hypoenhancing mass which measures 2.6 x 2.6 cm on 51/20, 2.4 cm craniocaudal on image 46/24. No superimposed acute pancreatitis. Spleen: Demonstrates a subcapsular T2 hyperintense 2.4 cm hypoenhancing lesion which is most likely a benign entity such as a lymphangioma. A smaller, similar morphology lesion is identified more laterally at 7 mm. Adrenals/Urinary Tract: Normal adrenal glands. Minimal adrenal nodularity of approximately 8 mm, favoring a small adenoma. Normal kidneys, without hydronephrosis. Stomach/Bowel: Large colonic stool burden. Normal stomach and small bowel. Vascular/Lymphatic: Aortic atherosclerosis. The SMA is contacted by tumor over an approximately 90 degrees span on 51/20. The SMV is contacted by tumor over an approximately 100 degrees span on 51/20. Celiac, portal vein, splenic vein uninvolved. No retroperitoneal or retrocrural adenopathy. Other:  No ascites.  No evidence of omental or peritoneal disease. Musculoskeletal: Convex right thoracolumbar spine curvature. IMPRESSION: 1. Pancreatic head mass, consistent with adenocarcinoma. This causes pancreatic and biliary duct dilatation, as detailed above. 2. Contact of tumor with the SMA and SMV, as detailed above. 3.  Tiny liver lesions which are too small to characterize. Early metastasis cannot be excluded. Recommend attention on follow-up. Electronically Signed   By: Abigail Miyamoto M.D.   On: 09/18/2021 10:53   NM Hepato W/EjeCT Fract  Result Date: 09/17/2021 CLINICAL DATA:  Elevated bilirubin.  Abdominal pain. EXAM: NUCLEAR MEDICINE HEPATOBILIARY IMAGING TECHNIQUE: Sequential images of the abdomen were obtained out to 60 minutes following intravenous administration of radiopharmaceutical. RADIOPHARMACEUTICALS:  5.3 mCi Tc-38m Choletec IV COMPARISON:  None Available. FINDINGS: Uniform uptake of radiotracer from the blood pool into the liver. No counts are evident within the common bile duct over 90 minutes of imaging. Gallbladder filling cannot be assessed without extrahepatic biliary excretion. IMPRESSION: No biliary excretion into the extrahepatic ducts identified. Differential includes extrahepatic biliary occlusion versus hepatitis. Favor common bile duct occlusion. Consider MRCP for further evaluation. These results will be called to the ordering clinician or representative by the Radiologist Assistant, and communication documented in the PACS or [employer] Electronically Signed   By: SSuzy BouchardM.D.   On: 09/17/2021 14:00     Malignant neoplasm of head of pancreas ([place] #T4 N0 M0 pancreatic adenocarcinoma stage III.July 2023 -MRI shows a pancreatic head mass measuring 2.6 x 2.6 cm which is likely the cause of his obstructive jaundice/abutting superior descending artery.  Also shows 2 other indeterminate subcentimeter liver lesions and it is unclear if they represent metastatic disease or not.  August 2023 PET scan no distant metastatic disease.   #I had a long discussion with the patient and daughter regarding the treatment options of primary cancer including chemotherapy/chemoradiation however unfortunately incurable.  Given his age/comorbidities-not a candidate for any radical surgery.  Discussed  treatment options are palliative not curative.  The median survival of stage III pancreatic cancer is around 16 to 18 months.   #  Also discussed chemotherapy with gemcitabine Abraxane-response rates are 20 to 25%; #  I reviewed at length the individual components with chemotherapy; and the schedule in detail.  I also discussed the potential side effects including but not limited to-increasing fatigue, nausea vomiting, diarrhea, hair loss, sores in the mouth, increase risk of infection and also neuropathy.  Also reviewed the multiple strategies to avoid/mitigate similar side effects including preemptive medications-for nausea vomiting.  See below regarding prognosis.  #Lengthy discussion with the patient and daughter-not too keen on proceeding with any therapies at this time.  They are concerned about the potential side effects of chemotherapy/tolerance.  Patient wants to speak to his rest of his family before confirming his plan of care.  # Incidental significantly hypermetabolic left thyroid lesion-hypermetabolic nodule on PET scan August 2023.  However in light of above findings/plan of care will hold off any further work-up.   # Likely neoplasm related pain: Continue oxycodone 5 mg every 8 hours as needed.  Given this back pain radiating to the leg suspect-radiculopathy from underlying disc disease.   # Constipation: Narcotic induced.  Recommend MiraLAX as needed.  #Prognosis: Discussed the prognosis is in order of few months without any treatment.  Discussed the natural history of pancreatic cancer without treatment.  Recommend palliative care evaluation//management.  # DISPOSITION: # referral to [facility]- re: pallative care with next appt/3 weeks # follow up in 3 weeks- Dr.Rao; labs- cbc/cmp-Dr.B    Above plan of care was discussed with patient/family in detail.  My contact information was given to the patient/family.     Cammie Sickle, MD 10/13/2021 9:35 PM

## 2021-10-13 NOTE — Progress Notes (Signed)
PET results.  C/o left leg pain with numbness x2-3 weeks.

## 2021-10-13 NOTE — Assessment & Plan Note (Addendum)
#  T4 N0 M0 pancreatic adenocarcinoma stage III.July 2023 -MRI shows a pancreatic head mass measuring 2.6 x 2.6 cm which is likely the cause of his obstructive jaundice/abutting superior descending artery.  Also shows 2 other indeterminate subcentimeter liver lesions and it is unclear if they represent metastatic disease or not.  August 2023 PET scan no distant metastatic disease.   #I had a long discussion with the patient and daughter regarding the treatment options of primary cancer including chemotherapy/chemoradiation however unfortunately incurable.  Given his age/comorbidities-not a candidate for any radical surgery.  Discussed treatment options are palliative not curative.  The median survival of stage III pancreatic cancer is around 16 to 18 months.   #Also discussed chemotherapy with gemcitabine Abraxane-response rates are 20 to 25%; #  I reviewed at length the individual components with chemotherapy; and the schedule in detail.  I also discussed the potential side effects including but not limited to-increasing fatigue, nausea vomiting, diarrhea, hair loss, sores in the mouth, increase risk of infection and also neuropathy.  Also reviewed the multiple strategies to avoid/mitigate similar side effects including preemptive medications-for nausea vomiting.  See below regarding prognosis.  #Lengthy discussion with the patient and daughter-not too keen on proceeding with any therapies at this time.  They are concerned about the potential side effects of chemotherapy/tolerance.  Patient wants to speak to his rest of his family before confirming his plan of care.  # Incidental significantly hypermetabolic left thyroid lesion-hypermetabolic nodule on PET scan August 2023.  However in light of above findings/plan of care will hold off any further work-up.   # Likely neoplasm related pain: Continue oxycodone 5 mg every 8 hours as needed.  Given this back pain radiating to the leg suspect-radiculopathy from  underlying disc disease.   # Constipation: Narcotic induced.  Recommend MiraLAX as needed.  #Prognosis: Discussed the prognosis is in order of few months without any treatment.  Discussed the natural history of pancreatic cancer without treatment.  Recommend palliative care evaluation//management.  # DISPOSITION: # referral to [facility]- re: pallative care with next appt/3 weeks # follow up in 3 weeks- Dr.Rao; labs- cbc/cmp-Dr.B

## 2021-10-20 ENCOUNTER — Ambulatory Visit (INDEPENDENT_AMBULATORY_CARE_PROVIDER_SITE_OTHER): Payer: Medicare Other | Admitting: Family Medicine

## 2021-10-20 ENCOUNTER — Encounter: Payer: Self-pay | Admitting: Family Medicine

## 2021-10-20 VITALS — BP 113/68 | HR 89 | Resp 16 | Wt 147.0 lb

## 2021-10-20 DIAGNOSIS — C259 Malignant neoplasm of pancreas, unspecified: Secondary | ICD-10-CM | POA: Diagnosis not present

## 2021-10-20 DIAGNOSIS — E7800 Pure hypercholesterolemia, unspecified: Secondary | ICD-10-CM | POA: Diagnosis not present

## 2021-10-20 DIAGNOSIS — R634 Abnormal weight loss: Secondary | ICD-10-CM

## 2021-10-20 DIAGNOSIS — E119 Type 2 diabetes mellitus without complications: Secondary | ICD-10-CM | POA: Diagnosis not present

## 2021-10-20 DIAGNOSIS — M5430 Sciatica, unspecified side: Secondary | ICD-10-CM

## 2021-10-20 MED ORDER — PREDNISONE 20 MG PO TABS: 20.0000 mg | ORAL_TABLET | Freq: Every day | ORAL | 0 refills | Status: DC

## 2021-10-20 NOTE — Patient Instructions (Signed)
STOP SUCRALFATE, OMEPRAZOLE AND SIMVASTATIN.

## 2021-10-20 NOTE — Progress Notes (Unsigned)
Established patient visit  I,[name],acting as a scribe for [name], MD.,have documented all relevant documentation on the behalf of [name], MD,as directed by  [name], MD while in the presence of [name], MD.   Patient: Michael Murphy   DOB: [birth year]   88 y.o. Male  MRN: [id number] Visit Date: 10/20/2021  Today's healthcare provider: [name], MD   Chief Complaint  Patient presents with   Follow-up   Subjective    HPI  Patient is a 88 year old male who presents for 1 month follow up after being diagnosed with pancreatic cancer.  He was last seen on 09/23/21.  At that time referral was made to Oncology.  On 10/13/21 Oncology referred patient to Palliative care.Feels better on Remeron. Sciatica symptoms down left leg. 2 daughters here, family is very supportive Medications: Outpatient Medications Prior to Visit  Medication Sig   albuterol (PROVENTIL HFA;VENTOLIN HFA) 108 (90 Base) MCG/ACT inhaler Inhale 2 puffs into the lungs every 6 (six) hours as needed for wheezing or shortness of breath.   amLODipine (NORVASC) 10 MG tablet Take 10 mg by mouth daily.   aspirin 81 MG chewable tablet Chew by mouth daily.   Coenzyme Q10 (CO Q 10 PO) Take by mouth.   fluticasone (FLONASE) 50 MCG/ACT nasal spray Place 1 spray into both nostrils as needed.    losartan (COZAAR) 50 MG tablet Take 1 tablet by mouth daily.   LUBRICATING PLUS EYE DROPS 0.5 % SOLN Apply to eye.   meclizine (ANTIVERT) 25 MG tablet Take 1 tablet (25 mg total) by mouth as needed for dizziness.   metFORMIN (GLUCOPHAGE) 1000 MG tablet TAKE ONE TABLET BY MOUTH TWICE A DAY WITH A MEAL   miconazole (MICOTIN) 2 % powder APPLY SMALL AMOUNT TOPICALLY EVERY DAY FOR FUNGAL INFECTION   mirtazapine (REMERON SOL-TAB) 30 MG disintegrating tablet Take 1 tablet (30 mg total) by mouth at bedtime.   Multiple Vitamins-Minerals (MENS MULTIVITAMIN PLUS PO) Take by mouth daily.    omeprazole (PRILOSEC) 20 MG capsule Take 1 capsule (20 mg total) by mouth daily.   oxyCODONE (OXY IR/ROXICODONE) 5 MG immediate release tablet Take 1 tablet (5 mg total) by mouth every 8 (eight) hours as needed for severe pain.   simvastatin (ZOCOR) 20 MG tablet Take 0.5 tablets by mouth at bedtime.   simvastatin (ZOCOR) 80 MG tablet Take by mouth.   sucralfate (CARAFATE) 1 g tablet Take 1 tablet (1 g total) by mouth 4 (four) times daily -  with meals and at bedtime.   tiotropium (SPIRIVA) 18 MCG inhalation capsule Place 18 mcg into inhaler and inhale every morning.   triamcinolone ointment (KENALOG) 0.1 % APPLY TOPICALLY TO AFFECTED AREAS WITH RASH ONCE TO TWICE DAILY AS NEEDED. AVOID FACE, GROIN, AND AXILLA.   WIXELA INHUB 250-50 MCG/ACT AEPB Inhale 1 puff into the lungs 2 (two) times daily.   No facility-administered medications prior to visit.    Review of Systems  Last metabolic panel Lab Results  Component Value Date   GLUCOSE 145 (H) 09/26/2021   NA 138 09/26/2021   K 4.5 09/26/2021   CL 103 09/26/2021   CO2 26 09/26/2021   BUN 17 09/26/2021   CREATININE 0.73 09/26/2021   GFRNONAA >60 09/26/2021   CALCIUM 10.5 (H) 09/26/2021   PROT 8.1 09/26/2021   ALBUMIN 4.4 09/26/2021   LABGLOB 2.8 09/23/2021   AGRATIO 1.6 09/23/2021   BILITOT  3.1 (H) 09/26/2021   ALKPHOS 252 (H) 09/26/2021   AST 62 (H) 09/26/2021   ALT 168 (H) 09/26/2021   ANIONGAP 9 09/26/2021       Objective    BP 113/68 (BP Location: Left Arm, Patient Position: Sitting, Cuff Size: Normal)   Pulse 89   Resp 16   Wt 147 lb (66.7 kg)   SpO2 100%   BMI 22.35 kg/m  BP Readings from Last 3 Encounters:  10/20/21 113/68  10/13/21 115/73  10/02/21 (!) 146/81   Wt Readings from Last 3 Encounters:  10/20/21 147 lb (66.7 kg)  10/13/21 146 lb 12.8 oz (66.6 kg)  10/02/21 150 lb (68 kg)      Physical Exam Vitals reviewed.  Constitutional:      General: He is not in acute distress.    Appearance: He is  well-developed.  HENT:     Head: Normocephalic and atraumatic.     Right Ear: Hearing normal.     Left Ear: Hearing normal.     Nose: Nose normal.  Eyes:     General: Lids are normal. No scleral icterus.       Right eye: No discharge.        Left eye: No discharge.     Conjunctiva/sclera: Conjunctivae normal.  Cardiovascular:     Rate and Rhythm: Normal rate and regular rhythm.     Heart sounds: Normal heart sounds.  Pulmonary:     Effort: Pulmonary effort is normal. No respiratory distress.  Abdominal:     General: There is no distension.     Palpations: Abdomen is soft.     Tenderness: There is no abdominal tenderness.  Skin:    General: Skin is warm and dry.     Findings: No lesion or rash.  Neurological:     General: No focal deficit present.     Mental Status: He is alert and oriented to person, place, and time.  Psychiatric:        Mood and Affect: Mood normal.        Speech: Speech normal.        Behavior: Behavior normal.        Thought Content: Thought content normal.        Judgment: Judgment normal.       No results found for any visits on 10/20/21.  Assessment & Plan     1. Malignant neoplasm of pancreas, unspecified location of malignancy [facility]) Now under palliative care.  2. Sciatica, unspecified laterality Prednisone for 5 days - predniSONE (DELTASONE) 20 MG tablet; Take 1 tablet (20 mg total) by mouth daily with breakfast.  Dispense: 5 tablet; Refill: 0  3. Weight loss Certainly due to pancreatic cancer and not reflux or gastritis at this time.  Stop sucralfate and omeprazole  4. Type 2 diabetes mellitus without complication, without long-term current use of insulin ([place]) Did not follow sugars.  He has not had hyperglycemia with symptoms.  5. Hypercholesterolemia without hypertriglyceridemia Stop Simvastatin.   Return in about 1 month (around 11/20/2021).      I, [name], MD, have reviewed all documentation for this visit. The  documentation on 10/21/21 for the exam, diagnosis, procedures, and orders are all accurate and complete.    Latanja Lehenbauer Cranford Mon, MD  [facility] [phone] (phone) [phone] (fax)  [place]

## 2021-11-05 ENCOUNTER — Ambulatory Visit: Payer: Medicare Other | Admitting: Oncology

## 2021-11-05 ENCOUNTER — Encounter: Payer: Medicare Other | Admitting: Hospice and Palliative Medicine

## 2021-11-07 ENCOUNTER — Other Ambulatory Visit: Payer: Medicare Other

## 2021-11-07 ENCOUNTER — Ambulatory Visit: Payer: Medicare Other | Admitting: Oncology

## 2021-11-07 ENCOUNTER — Encounter: Payer: Medicare Other | Admitting: Hospice and Palliative Medicine

## 2021-11-18 ENCOUNTER — Other Ambulatory Visit: Payer: Self-pay | Admitting: Dermatology

## 2021-11-18 DIAGNOSIS — R21 Rash and other nonspecific skin eruption: Secondary | ICD-10-CM

## 2021-11-19 ENCOUNTER — Inpatient Hospital Stay: Payer: Medicare Other

## 2021-11-19 ENCOUNTER — Encounter: Payer: Self-pay | Admitting: Oncology

## 2021-11-19 ENCOUNTER — Inpatient Hospital Stay (HOSPITAL_BASED_OUTPATIENT_CLINIC_OR_DEPARTMENT_OTHER): Payer: Medicare Other | Admitting: Hospice and Palliative Medicine

## 2021-11-19 ENCOUNTER — Inpatient Hospital Stay: Payer: Medicare Other | Attending: Oncology | Admitting: Oncology

## 2021-11-19 VITALS — BP 109/72 | HR 88 | Temp 97.4°F | Resp 16 | Wt 145.5 lb

## 2021-11-19 DIAGNOSIS — Z7189 Other specified counseling: Secondary | ICD-10-CM | POA: Diagnosis not present

## 2021-11-19 DIAGNOSIS — Z515 Encounter for palliative care: Secondary | ICD-10-CM

## 2021-11-19 DIAGNOSIS — C250 Malignant neoplasm of head of pancreas: Secondary | ICD-10-CM

## 2021-11-19 DIAGNOSIS — Z79899 Other long term (current) drug therapy: Secondary | ICD-10-CM | POA: Diagnosis not present

## 2021-11-19 LAB — CBC WITH DIFFERENTIAL/PLATELET
Abs Immature Granulocytes: 0.03 10*3/uL (ref 0.00–0.07)
Basophils Absolute: 0 10*3/uL (ref 0.0–0.1)
Basophils Relative: 0 %
Eosinophils Absolute: 0.3 10*3/uL (ref 0.0–0.5)
Eosinophils Relative: 4 %
HCT: 39.5 % (ref 39.0–52.0)
Hemoglobin: 12.9 g/dL — ABNORMAL LOW (ref 13.0–17.0)
Immature Granulocytes: 0 %
Lymphocytes Relative: 23 %
Lymphs Abs: 1.8 10*3/uL (ref 0.7–4.0)
MCH: 28.1 pg (ref 26.0–34.0)
MCHC: 32.7 g/dL (ref 30.0–36.0)
MCV: 86.1 fL (ref 80.0–100.0)
Monocytes Absolute: 0.6 10*3/uL (ref 0.1–1.0)
Monocytes Relative: 7 %
Neutro Abs: 5.1 10*3/uL (ref 1.7–7.7)
Neutrophils Relative %: 66 %
Platelets: 236 10*3/uL (ref 150–400)
RBC: 4.59 MIL/uL (ref 4.22–5.81)
RDW: 13.9 % (ref 11.5–15.5)
WBC: 7.9 10*3/uL (ref 4.0–10.5)
nRBC: 0 % (ref 0.0–0.2)

## 2021-11-19 LAB — COMPREHENSIVE METABOLIC PANEL
ALT: 12 U/L (ref 0–44)
AST: 16 U/L (ref 15–41)
Albumin: 4 g/dL (ref 3.5–5.0)
Alkaline Phosphatase: 64 U/L (ref 38–126)
Anion gap: 7 (ref 5–15)
BUN: 14 mg/dL (ref 8–23)
CO2: 25 mmol/L (ref 22–32)
Calcium: 9.5 mg/dL (ref 8.9–10.3)
Chloride: 108 mmol/L (ref 98–111)
Creatinine, Ser: 0.73 mg/dL (ref 0.61–1.24)
GFR, Estimated: >60 mL/min (ref >60)
Glucose, Bld: 131 mg/dL — ABNORMAL HIGH (ref 70–99)
Potassium: 4.5 mmol/L (ref 3.5–5.1)
Sodium: 140 mmol/L (ref 135–145)
Total Bilirubin: 1.2 mg/dL (ref 0.3–1.2)
Total Protein: 7.3 g/dL (ref 6.5–8.1)

## 2021-11-19 NOTE — Progress Notes (Signed)
Patient prior to being seen.  Recent diagnosis with stage III-IV pancreatic cancer.  Patient saw Dr. Janese Banks today and is not interested in treatment and referral is being made to hospice.

## 2021-11-19 NOTE — Progress Notes (Signed)
Hematology/Oncology Consult note [facility]  Telephone:([phone] [phone]  Patient Care Team: Jerrol Banana., MD as PCP - General (Family Medicine) Kate Sable, MD as PCP - Cardiology (Cardiology) Sharlotte Alamo, DPM as Consulting Physician (Podiatry) Lendon Collar, MD as Referring Physician (Family Medicine) Clent Jacks, RN as Oncology Nurse Navigator Cammie Sickle, MD as Consulting Physician (Oncology)   Name of the patient: Michael Murphy  [id number]  August 15, 1927   Date of visit: 11/19/21  Diagnosis-stage III pancreatic adenocarcinoma T4 N0 M0  Chief complaint/ Reason for visit-discuss further management of pancreatic cancer  Heme/Onc history:  Oncology History Overview Note  DIAGNOSIS:  A. PANCREATIC HEAD MASS; EUS GUIDED FINE-NEEDLE ASPIRATION:  - POSITIVE FOR MALIGNANCY.  - ADENOCARCINOMA.   Comment:  There is insufficient tissue present within the cellblock preparation  for ancillary testing.    Malignant neoplasm of head of pancreas ([place])  10/13/2021 Initial Diagnosis   Malignant neoplasm of head of pancreas ([place])   10/13/2021 Cancer Staging   Staging form: Exocrine Pancreas, AJCC 8th Edition - Clinical: Stage III (cT4, cN0, cM0) - Signed by Cammie Sickle, MD on 10/13/2021 Total positive nodes: 0      Interval history-patient is here with his daughter today.  Overall he reports doing well.  Appetite and weight have remained stable.  Denies any significant abdominal pain and has not used oxycodone as much.  ECOG PS- 1 Pain scale- 0 Opioid associated constipation- no  Review of systems- Review of Systems  Constitutional:  Positive for malaise/fatigue. Negative for chills, fever and weight loss.  HENT:  Negative for congestion, ear discharge and nosebleeds.   Eyes:  Negative for blurred vision.  Respiratory:  Negative for cough, hemoptysis, sputum production, shortness of breath and  wheezing.   Cardiovascular:  Negative for chest pain, palpitations, orthopnea and claudication.  Gastrointestinal:  Negative for abdominal pain, blood in stool, constipation, diarrhea, heartburn, melena, nausea and vomiting.  Genitourinary:  Negative for dysuria, flank pain, frequency, hematuria and urgency.  Musculoskeletal:  Negative for back pain, joint pain and myalgias.  Skin:  Negative for rash.  Neurological:  Negative for dizziness, tingling, focal weakness, seizures, weakness and headaches.  Endo/Heme/Allergies:  Does not bruise/bleed easily.  Psychiatric/Behavioral:  Negative for depression and suicidal ideas. The patient does not have insomnia.       Allergies  Allergen Reactions   Accupril [Quinapril Hcl] Anaphylaxis   Quinapril Swelling   Flunisolide     Other reaction(s): Burning sensation of nose   Diclofenac Rash     Past Medical History:  Diagnosis Date   Adiposity 07/12/2014   Allergic rhinitis 02/11/2007   Arthritis, degenerative 02/11/2007   Benign essential HTN 02/11/2007   Goal BP< 130/80 due DMII.    CA of prostate ([place]) 07/12/2014   Carpal tunnel syndrome 07/12/2014   Diabetes mellitus without complication ([place])    Diabetic neuropathy ([place]) 02/12/2009   s/p Lifestyle Education Diabetic Classes 2012.  Monofilament intact 09/18/2013-normal bilaterally.    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    OP (osteoporosis) 02/11/2007   Pancreatic mass      Past Surgical History:  Procedure Laterality Date   CATARACT EXTRACTION     ERCP N/A 09/18/2021   Procedure: ENDOSCOPIC RETROGRADE CHOLANGIOPANCREATOGRAPHY (  ERCP);  Surgeon: Lucilla Lame, MD;  Location: [facility] END[facility]COPY;  Service: Endoscopy;  Laterality: N/A;   EUS N/A  10/02/2021   Procedure: UPPER ENDOSCOPIC ULTRASOUND (EUS) LINEAR;  Surgeon: Reita Cliche, MD;  Location: [facility] ENDOSCOPY;  Service: Gastroenterology;  Laterality: N/A;   KNEE ARTHROSCOPY     left   MULTIPLE TOOTH EXTRACTIONS     PROSTATECTOMY     ROTATOR CUFF REPAIR      Social History   Socioeconomic History   Marital status: Widowed    Spouse name: Not on file   Number of children: 8   Years of education: Not on file   Highest education level: GED or equivalent  Occupational History   Occupation: retired  Tobacco Use   Smoking status: Former    Packs/day: 0.50    Years: 18.00    Total pack years: 9.00    Types: Cigarettes    Quit date: 03/09/1991    Years since quitting: 30.7   Smokeless tobacco: Never  Vaping Use   Vaping Use: Never used  Substance and Sexual Activity   Alcohol use: Yes    Alcohol/week: 1.0 - 3.0 standard drink of alcohol    Types: 1 - 3 Cans of beer per week    Comment: none last 2 months   Drug use: No   Sexual activity: Yes    Birth control/protection: None  Other Topics Concern   Not on file  Social History Narrative   Not on file   Social Determinants of Health   Financial Resource Strain: Low Risk  (08/31/2019)   Overall Financial Resource Strain (CARDIA)    Difficulty of Paying Living Expenses: Not hard at all  Food Insecurity: No Food Insecurity (08/31/2019)   Hunger Vital Sign    Worried About Running Out of Food in the Last Year: Never true    Ran Out of Food in the Last Year: Never true  Transportation Needs: No Transportation Needs (08/31/2019)   PRAPARE - Hydrologist (Medical): No    Lack of Transportation (Non-Medical): No  Physical Activity: Inactive (08/30/2018)   Exercise Vital Sign    Days of Exercise per Week: 0 days    Minutes of Exercise per Session: 0 min  Stress: No Stress Concern Present (08/31/2019)   [place]     Feeling of Stress : Not at all  Social Connections: Moderately Isolated (08/31/2019)   Social Connection and Isolation Panel [NHANES]    Frequency of Communication with Friends and Family: More than three times a week    Frequency of Social Gatherings with Friends and Family: Once a week    Attends Religious Services: More than 4 times per year    Active Member of [employer] or Organizations: No    Attends Archivist Meetings: Never    Marital Status: Widowed  Intimate Partner Violence: Not At Risk (08/31/2019)   Humiliation, Afraid, Rape, and Kick questionnaire    Fear of Current or Ex-Partner: No    Emotionally Abused: No    Physically Abused: No    Sexually Abused: No    Family History  Problem Relation Age of Onset   Hypertension Mother    Stroke Mother    Diabetes Sister    Hypertension Sister    Diabetes Brother    Diabetes Brother    Cancer Neg Hx      Current Outpatient Medications:    albuterol (  PROVENTIL HFA;VENTOLIN HFA) 108 (90 Base) MCG/ACT inhaler, Inhale 2 puffs into the lungs every 6 (six) hours as needed for wheezing or shortness of breath., Disp: 1 Inhaler, Rfl: 12   amLODipine (NORVASC) 10 MG tablet, Take 10 mg by mouth daily., Disp: , Rfl:    aspirin 81 MG chewable tablet, Chew by mouth daily., Disp: , Rfl:    Coenzyme Q10 (CO Q 10 PO), Take by mouth., Disp: , Rfl:    fluticasone (FLONASE) 50 MCG/ACT nasal spray, Place 1 spray into both nostrils as needed. , Disp: , Rfl:    losartan (COZAAR) 50 MG tablet, Take 1 tablet by mouth daily., Disp: , Rfl:    LUBRICATING PLUS EYE DROPS 0.5 % SOLN, Apply to eye., Disp: , Rfl:    meclizine (ANTIVERT) 25 MG tablet, Take 1 tablet (25 mg total) by mouth as needed for dizziness., Disp: 45 tablet, Rfl: 1   metFORMIN (GLUCOPHAGE) 1000 MG tablet, TAKE ONE TABLET BY MOUTH TWICE A DAY WITH A MEAL, Disp: 180 tablet, Rfl: 3   miconazole (MICOTIN) 2 % powder, APPLY SMALL AMOUNT TOPICALLY EVERY DAY FOR FUNGAL INFECTION, Disp: ,  Rfl:    mirtazapine (REMERON SOL-TAB) 30 MG disintegrating tablet, Take 1 tablet (30 mg total) by mouth at bedtime., Disp: 30 tablet, Rfl: 11   Multiple Vitamins-Minerals (MENS MULTIVITAMIN PLUS PO), Take by mouth daily., Disp: , Rfl:    omeprazole (PRILOSEC) 20 MG capsule, Take 1 capsule (20 mg total) by mouth daily., Disp: 30 capsule, Rfl: 5   predniSONE (DELTASONE) 20 MG tablet, Take 1 tablet (20 mg total) by mouth daily with breakfast., Disp: 5 tablet, Rfl: 0   tiotropium (SPIRIVA) 18 MCG inhalation capsule, Place 18 mcg into inhaler and inhale every morning., Disp: , Rfl:    WIXELA INHUB 250-50 MCG/ACT AEPB, Inhale 1 puff into the lungs 2 (two) times daily., Disp: , Rfl:    oxyCODONE (OXY IR/ROXICODONE) 5 MG immediate release tablet, Take 1 tablet (5 mg total) by mouth every 8 (eight) hours as needed for severe pain. (Patient not taking: Reported on 11/19/2021), Disp: 60 tablet, Rfl: 0   simvastatin (ZOCOR) 20 MG tablet, Take 0.5 tablets by mouth at bedtime. (Patient not taking: Reported on 11/19/2021), Disp: , Rfl:    simvastatin (ZOCOR) 80 MG tablet, Take by mouth. (Patient not taking: Reported on 11/19/2021), Disp: , Rfl:    sucralfate (CARAFATE) 1 g tablet, Take 1 tablet (1 g total) by mouth 4 (four) times daily -  with meals and at bedtime. (Patient not taking: Reported on 11/19/2021), Disp: 120 tablet, Rfl: 5   triamcinolone ointment (KENALOG) 0.1 %, APPLY TOPICALLY TO AFFECTED AREA(S) WITH RASH ONCE TO TWICE DAILY AS NEEDED. AVOID FACE, GROIN, AND AXILLA. (Patient not taking: Reported on 11/19/2021), Disp: 454 g, Rfl: 0  Physical exam:  Vitals:   11/19/21 0950  BP: 109/72  Pulse: 88  Resp: 16  Temp: (!) 97.4 F (36.3 C)  SpO2: 100%  Weight: 145 lb 8 oz (66 kg)   Physical Exam Constitutional:      General: He is not in acute distress. Eyes:     General: No scleral icterus. Cardiovascular:     Rate and Rhythm: Normal rate and regular rhythm.     Heart sounds: Normal heart sounds.   Pulmonary:     Effort: Pulmonary effort is normal.     Breath sounds: Normal breath sounds.  Skin:    General: Skin is warm and dry.  Neurological:  Mental Status: He is alert and oriented to person, place, and time.         Latest Ref Rng & Units 11/19/2021    9:24 AM  CMP  Glucose 70 - 99 mg/dL 131   BUN 8 - 23 mg/dL 14   Creatinine 0.61 - 1.24 mg/dL 0.73   Sodium 135 - 145 mmol/L 140   Potassium 3.5 - 5.1 mmol/L 4.5   Chloride 98 - 111 mmol/L 108   CO2 22 - 32 mmol/L 25   Calcium 8.9 - 10.3 mg/dL 9.5   Total Protein 6.5 - 8.1 g/dL 7.3   Total Bilirubin 0.3 - 1.2 mg/dL 1.2   Alkaline Phos 38 - 126 U/L 64   AST 15 - 41 U/L 16   ALT 0 - 44 U/L 12       Latest Ref Rng & Units 11/19/2021    9:24 AM  CBC  WBC 4.0 - 10.5 K/uL 7.9   Hemoglobin 13.0 - 17.0 g/dL 12.9   Hematocrit 39.0 - 52.0 % 39.5   Platelets 150 - 400 K/uL 236     No images are attached to the encounter.  No results found.   Assessment and plan- Patient is a 88 y.o. male with newly diagnosed pancreatic adenocarcinoma stage III T4 N0 M0 here to discuss further management  We discussed that his pancreatic mass involves the celiac plexus and as such he is not resectable.  PET scan did not show any evidence of distant metastatic disease.  He likely has unresectable disease and given his age she would not be a candidate for Whipple surgery.  Typically unresectable locally advanced pancreatic cancer would require prolonged chemotherapy until progression or toxicity.  We could try gemcitabine Abraxane regimen 2 weeks on and 1 week off but it would be associated with potential side effects such as nausea vomiting low blood counts risk of infections and hospitalizations.  Patient and his daughter understand and would not like to pursue any chemotherapy at this time.  We discussed proceeding with best supportive care/hospice.  That his overall prognosis would be 3 to 6 months without treatment.  Patient understands  and is accepting of home hospice.  We will make the referral as such.  Patient hadERCP stent placed by Dr. Allen Norris in July 2023.  Presently there is no evidence of biliary obstruction and LFTs have normalized.  I will reach out to Dr. Allen Norris today if the metal stent can be left in place.  No follow-up needed with me at this time    Visit Diagnosis 1. Malignant neoplasm of head of pancreas ([place])   2. Goals of care, counseling/discussion      Dr. Randa Evens, MD, MPH [facility] at [facility] [phone] 11/19/2021 1:01 PM

## 2021-11-19 NOTE — Progress Notes (Signed)
Pts daughter states pt has been experiencing numbness in his left leg; leg does not give out, not sure if it deals with the fall.

## 2021-11-20 ENCOUNTER — Encounter: Payer: Self-pay | Admitting: Family Medicine

## 2021-11-20 ENCOUNTER — Ambulatory Visit (INDEPENDENT_AMBULATORY_CARE_PROVIDER_SITE_OTHER): Payer: Medicare Other | Admitting: Family Medicine

## 2021-11-20 VITALS — BP 122/74 | HR 94 | Temp 97.5°F | Resp 16 | Wt 145.0 lb

## 2021-11-20 DIAGNOSIS — J449 Chronic obstructive pulmonary disease, unspecified: Secondary | ICD-10-CM | POA: Diagnosis not present

## 2021-11-20 DIAGNOSIS — C7970 Secondary malignant neoplasm of unspecified adrenal gland: Secondary | ICD-10-CM | POA: Diagnosis not present

## 2021-11-20 DIAGNOSIS — E119 Type 2 diabetes mellitus without complications: Secondary | ICD-10-CM

## 2021-11-20 DIAGNOSIS — E7800 Pure hypercholesterolemia, unspecified: Secondary | ICD-10-CM

## 2021-11-20 DIAGNOSIS — Z23 Encounter for immunization: Secondary | ICD-10-CM | POA: Diagnosis not present

## 2021-11-20 DIAGNOSIS — Z6822 Body mass index (BMI) 22.0-22.9, adult: Secondary | ICD-10-CM | POA: Diagnosis not present

## 2021-11-20 DIAGNOSIS — C259 Malignant neoplasm of pancreas, unspecified: Secondary | ICD-10-CM | POA: Diagnosis not present

## 2021-11-20 DIAGNOSIS — M3500 Sicca syndrome, unspecified: Secondary | ICD-10-CM | POA: Diagnosis not present

## 2021-11-20 DIAGNOSIS — I10 Essential (primary) hypertension: Secondary | ICD-10-CM | POA: Diagnosis not present

## 2021-11-20 DIAGNOSIS — C787 Secondary malignant neoplasm of liver and intrahepatic bile duct: Secondary | ICD-10-CM | POA: Diagnosis not present

## 2021-11-20 DIAGNOSIS — K831 Obstruction of bile duct: Secondary | ICD-10-CM | POA: Diagnosis not present

## 2021-11-20 DIAGNOSIS — C250 Malignant neoplasm of head of pancreas: Secondary | ICD-10-CM | POA: Diagnosis not present

## 2021-11-20 DIAGNOSIS — J302 Other seasonal allergic rhinitis: Secondary | ICD-10-CM | POA: Diagnosis not present

## 2021-11-20 DIAGNOSIS — E1149 Type 2 diabetes mellitus with other diabetic neurological complication: Secondary | ICD-10-CM

## 2021-11-20 DIAGNOSIS — Z8546 Personal history of malignant neoplasm of prostate: Secondary | ICD-10-CM | POA: Diagnosis not present

## 2021-11-20 DIAGNOSIS — K219 Gastro-esophageal reflux disease without esophagitis: Secondary | ICD-10-CM | POA: Diagnosis not present

## 2021-11-20 DIAGNOSIS — E785 Hyperlipidemia, unspecified: Secondary | ICD-10-CM | POA: Diagnosis not present

## 2021-11-20 LAB — POCT GLYCOSYLATED HEMOGLOBIN (HGB A1C)
Est. average glucose Bld gHb Est-mCnc: 183
Hemoglobin A1C: 8 % — AB (ref 4.0–5.6)

## 2021-11-20 NOTE — Progress Notes (Unsigned)
I,Roshena L Chambers,acting as a scribe for [name], MD.,have documented all relevant documentation on the behalf of [name], MD,as directed by  [name], MD while in the presence of [name], MD.    Established patient visit   Patient: Michael Murphy   DOB: 07-08-1927   88 y.o. Male  MRN: [id number] Visit Date: 11/20/2021  Today's healthcare provider: [name], MD   Chief Complaint  Patient presents with   Diabetes   Hyperlipidemia   Hypertension   Subjective    HPI  Patient is brought in by his daughter for follow-up.  He was seen by oncology yesterday and they have decided not to pursue any treatment for his pancreatic cancer.  He is feeling a little weaker and has some decreased appetite and has some pain in his left leg.  Other than that he is doing okay.   Diabetes Mellitus Type II, follow-up  Lab Results  Component Value Date   HGBA1C 10.9 (A) 07/18/2021   HGBA1C 8.8 (A) 01/23/2021   HGBA1C 6.3 (A) 09/23/2020   Last seen for diabetes 4 months ago.  Management since then includes continuing the same treatment.  Home blood sugar records: fasting range: 130-140 Most Recent Eye Exam: need info. Done through the [place]  --------------------------------------------------------------------------------------------------- Hypertension, follow-up  BP Readings from Last 3 Encounters:  11/20/21 122/74  11/19/21 109/72  10/20/21 113/68   Wt Readings from Last 3 Encounters:  11/20/21 145 lb (65.8 kg)  11/19/21 145 lb 8 oz (66 kg)  10/20/21 147 lb (66.7 kg)     He was last seen for hypertension 4 months ago.  Management since that visit includes; on amlodipine 10 mg and losartan 50 mg.  Outside blood pressures are 125/60.  --------------------------------------------------------------------------------------------------- Lipid/Cholesterol, follow-up  Last Lipid Panel: Lab Results  Component Value Date   CHOL 168  07/18/2021   LDLCALC 70 07/18/2021   HDL 87 07/18/2021   TRIG 53 07/18/2021    He was last seen for this 4 months ago.  Management since that visit includes continue same medication.  He reports good compliance with treatment. He is not having side effects.   Symptoms: No appetite changes No foot ulcerations  No chest pain No chest pressure/discomfort  No dyspnea No orthopnea  No fatigue No lower extremity edema  No palpitations No paroxysmal nocturnal dyspnea  No nausea No numbness or tingling of extremity  No polydipsia No polyuria  No speech difficulty No syncope   He is following a Regular diet. Current exercise: none  Last metabolic panel Lab Results  Component Value Date   GLUCOSE 131 (H) 11/19/2021   NA 140 11/19/2021   K 4.5 11/19/2021   BUN 14 11/19/2021   CREATININE 0.73 11/19/2021   EGFR 87 09/23/2021   GFRNONAA >60 11/19/2021   CALCIUM 9.5 11/19/2021   AST 16 11/19/2021   ALT 12 11/19/2021   The ASCVD Risk score (Arnett DK, et al., 2019) failed to calculate for the following reasons:   The 2019 ASCVD risk score is only valid for ages 40 to 47  ---------------------------------------------------------------------------------------------------   Medications: Outpatient Medications Prior to Visit  Medication Sig   albuterol (PROVENTIL HFA;VENTOLIN HFA) 108 (90 Base) MCG/ACT inhaler Inhale 2 puffs into the lungs every 6 (six) hours as needed for wheezing or shortness of breath.   amLODipine (NORVASC) 10 MG tablet Take 10 mg by mouth daily.   aspirin 81 MG chewable tablet Chew  by mouth daily.   Coenzyme Q10 (CO Q 10 PO) Take by mouth.   fluticasone (FLONASE) 50 MCG/ACT nasal spray Place 1 spray into both nostrils as needed.    losartan (COZAAR) 50 MG tablet Take 1 tablet by mouth daily.   LUBRICATING PLUS EYE DROPS 0.5 % SOLN Apply to eye.   meclizine (ANTIVERT) 25 MG tablet Take 1 tablet (25 mg total) by mouth as needed for dizziness.   metFORMIN  (GLUCOPHAGE) 1000 MG tablet TAKE ONE TABLET BY MOUTH TWICE A DAY WITH A MEAL   miconazole (MICOTIN) 2 % powder APPLY SMALL AMOUNT TOPICALLY EVERY DAY FOR FUNGAL INFECTION   mirtazapine (REMERON SOL-TAB) 30 MG disintegrating tablet Take 1 tablet (30 mg total) by mouth at bedtime.   Multiple Vitamins-Minerals (MENS MULTIVITAMIN PLUS PO) Take by mouth daily.   omeprazole (PRILOSEC) 20 MG capsule Take 1 capsule (20 mg total) by mouth daily.   oxyCODONE (OXY IR/ROXICODONE) 5 MG immediate release tablet Take 1 tablet (5 mg total) by mouth every 8 (eight) hours as needed for severe pain.   simvastatin (ZOCOR) 80 MG tablet Take by mouth.   sucralfate (CARAFATE) 1 g tablet Take 1 tablet (1 g total) by mouth 4 (four) times daily -  with meals and at bedtime.   tiotropium (SPIRIVA) 18 MCG inhalation capsule Place 18 mcg into inhaler and inhale every morning.   triamcinolone ointment (KENALOG) 0.1 % APPLY TOPICALLY TO AFFECTED AREA(S) WITH RASH ONCE TO TWICE DAILY AS NEEDED. AVOID FACE, GROIN, AND AXILLA.   WIXELA INHUB 250-50 MCG/ACT AEPB Inhale 1 puff into the lungs 2 (two) times daily.   [DISCONTINUED] predniSONE (DELTASONE) 20 MG tablet Take 1 tablet (20 mg total) by mouth daily with breakfast. (Patient not taking: Reported on 11/20/2021)   [DISCONTINUED] simvastatin (ZOCOR) 20 MG tablet Take 0.5 tablets by mouth at bedtime. (Patient not taking: Reported on 11/19/2021)   No facility-administered medications prior to visit.    Review of Systems  Constitutional:  Negative for appetite change, chills and fever.  Respiratory:  Negative for chest tightness, shortness of breath and wheezing.   Cardiovascular:  Negative for chest pain and palpitations.  Gastrointestinal:  Negative for abdominal pain, nausea and vomiting.    Last hemoglobin A1c Lab Results  Component Value Date   HGBA1C 10.9 (A) 07/18/2021       Objective    BP 122/74 (BP Location: Left Arm, Patient Position: Sitting, Cuff Size:  Normal)   Pulse 94   Temp (!) 97.5 F (36.4 C) (Oral)   Resp 16   Wt 145 lb (65.8 kg)   SpO2 97% Comment: room air  BMI 22.05 kg/m  BP Readings from Last 3 Encounters:  11/20/21 122/74  11/19/21 109/72  10/20/21 113/68   Wt Readings from Last 3 Encounters:  11/20/21 145 lb (65.8 kg)  11/19/21 145 lb 8 oz (66 kg)  10/20/21 147 lb (66.7 kg)      Physical Exam Vitals reviewed.  Constitutional:      General: He is not in acute distress.    Appearance: He is well-developed.  HENT:     Head: Normocephalic and atraumatic.     Right Ear: Hearing normal.     Left Ear: Hearing normal.     Nose: Nose normal.  Eyes:     General: Lids are normal. No scleral icterus.       Right eye: No discharge.        Left eye: No discharge.  Conjunctiva/sclera: Conjunctivae normal.  Cardiovascular:     Rate and Rhythm: Normal rate and regular rhythm.     Heart sounds: Normal heart sounds.  Pulmonary:     Effort: Pulmonary effort is normal. No respiratory distress.  Abdominal:     General: There is no distension.     Palpations: Abdomen is soft.     Tenderness: There is no abdominal tenderness.  Skin:    General: Skin is warm and dry.     Findings: No lesion or rash.  Neurological:     General: No focal deficit present.     Mental Status: He is alert and oriented to person, place, and time.  Psychiatric:        Mood and Affect: Mood normal.        Speech: Speech normal.        Behavior: Behavior normal.        Thought Content: Thought content normal.        Judgment: Judgment normal.       No results found for any visits on 11/20/21.  Assessment & Plan     1. Malignant neoplasm of pancreas, unspecified location of malignancy [facility] [place]) Patient and daughter both understand and agree that they will to control symptoms only.  No aggressive treatment. Due to decreased appetite and some mild insomnia we will try mirtazapine 30 mg at night and see if it will help  appetite and maintain  some weight 2. Type 2 diabetes mellitus without complication, without long-term current use of insulin (HCC) A1c is 8.0.  Would not treat this any further or more aggressively.  Him to eat what he wants to and to stop checking blood sugars. - POCT HgB A1C  3. Need for immunization against influenza  - Flu Vaccine QUAD High Dose(Fluad)  4. History of prostate cancer   5. Benign essential HTN   6. Malignant neoplasm of head of pancreas ([place])   7. Other diabetic neurological complication associated with type 2 diabetes mellitus ([place]) Patient has pain medicine likely sciatica. No work-up indicated 8. Hypercholesterolemia without hypertriglyceridemia I would actually stop his simvastatin which we have already done   No follow-ups on file.      I, [name], MD, have reviewed all documentation for this visit. The documentation on 11/25/21 for the exam, diagnosis, procedures, and orders are all accurate and complete.    Vidal Lampkins Cranford Mon, MD  [facility] [phone] (phone) [phone] (fax)  [place]

## 2021-11-21 DIAGNOSIS — C259 Malignant neoplasm of pancreas, unspecified: Secondary | ICD-10-CM | POA: Diagnosis not present

## 2021-11-21 DIAGNOSIS — E119 Type 2 diabetes mellitus without complications: Secondary | ICD-10-CM | POA: Diagnosis not present

## 2021-11-21 DIAGNOSIS — C7970 Secondary malignant neoplasm of unspecified adrenal gland: Secondary | ICD-10-CM | POA: Diagnosis not present

## 2021-11-21 DIAGNOSIS — I10 Essential (primary) hypertension: Secondary | ICD-10-CM | POA: Diagnosis not present

## 2021-11-21 DIAGNOSIS — K831 Obstruction of bile duct: Secondary | ICD-10-CM | POA: Diagnosis not present

## 2021-11-21 DIAGNOSIS — C787 Secondary malignant neoplasm of liver and intrahepatic bile duct: Secondary | ICD-10-CM | POA: Diagnosis not present

## 2021-11-25 DIAGNOSIS — C787 Secondary malignant neoplasm of liver and intrahepatic bile duct: Secondary | ICD-10-CM | POA: Diagnosis not present

## 2021-11-25 DIAGNOSIS — C7970 Secondary malignant neoplasm of unspecified adrenal gland: Secondary | ICD-10-CM | POA: Diagnosis not present

## 2021-11-25 DIAGNOSIS — E119 Type 2 diabetes mellitus without complications: Secondary | ICD-10-CM | POA: Diagnosis not present

## 2021-11-25 DIAGNOSIS — K831 Obstruction of bile duct: Secondary | ICD-10-CM | POA: Diagnosis not present

## 2021-11-25 DIAGNOSIS — C259 Malignant neoplasm of pancreas, unspecified: Secondary | ICD-10-CM | POA: Diagnosis not present

## 2021-11-25 DIAGNOSIS — I10 Essential (primary) hypertension: Secondary | ICD-10-CM | POA: Diagnosis not present

## 2021-11-26 DIAGNOSIS — K831 Obstruction of bile duct: Secondary | ICD-10-CM | POA: Diagnosis not present

## 2021-11-26 DIAGNOSIS — E119 Type 2 diabetes mellitus without complications: Secondary | ICD-10-CM | POA: Diagnosis not present

## 2021-11-26 DIAGNOSIS — C7970 Secondary malignant neoplasm of unspecified adrenal gland: Secondary | ICD-10-CM | POA: Diagnosis not present

## 2021-11-26 DIAGNOSIS — C787 Secondary malignant neoplasm of liver and intrahepatic bile duct: Secondary | ICD-10-CM | POA: Diagnosis not present

## 2021-11-26 DIAGNOSIS — C259 Malignant neoplasm of pancreas, unspecified: Secondary | ICD-10-CM | POA: Diagnosis not present

## 2021-11-26 DIAGNOSIS — I10 Essential (primary) hypertension: Secondary | ICD-10-CM | POA: Diagnosis not present

## 2021-11-28 ENCOUNTER — Ambulatory Visit: Payer: Self-pay

## 2021-11-28 NOTE — Patient Instructions (Signed)
Visit Information  Thank you for taking time to visit with me today. Please don't hesitate to contact me if I can be of assistance to you.   Following are the goals we discussed today:   Goals Addressed             This Visit's Progress    COMPLETED: RNCM: Patient is now with Hospice       Care Coordination Interventions: Evaluation of current treatment plan related to cancer treatment and patient's adherence to plan as established by provider Advised patient to to call the office for questions, concerns, or new needs Advised patient to discuss changes in sx and sx  with provider Screening for signs and symptoms of depression related to chronic disease state  Assessed social determinant of health barriers The patient is now enrolled with Hospice. The patients daughter states they started working with him lat week . She is thankful for the support of Hospice and the support from the [facility] team. Reflective listening and support given.            Please call the care guide team at [phone] if you need to schedule an appointment.   If you are experiencing a Mental Health or [place] or need someone to talk to, please call the Suicide and Crisis Lifeline: 988 call the [place] National Suicide Prevention Lifeline: 709-069-5275 or TTY: 314-535-4563 TTY 838 528 8218) to talk to a trained counselor call 1-800-273-TALK (toll free, 24 hour hotline)  Patient verbalizes understanding of instructions and care plan provided today and agrees to view in [place]. Active MyChart status and patient understanding of how to access instructions and care plan via MyChart confirmed with patient.     No further follow up required: the patient is enrolled with Hospice Services  Noreene Larsson RN, MSN, [place]  Mobile: [phone]

## 2021-11-28 NOTE — Patient Outreach (Signed)
  Care Coordination   Initial Visit Note   11/28/2021 Name: Michael Murphy MRN: [id number] DOB: 1928-01-28  Michael Murphy is a 88 y.o. year old male who sees Jerrol Banana., MD for primary care. I  Spoke with the patients daughter,  Michael Murphy  What matters to the patients health and wellness today?  That the patient is comfortable and gets the care he needs    Goals Addressed             This Visit's Progress    COMPLETED: RNCM: Patient is now with Hospice       Care Coordination Interventions: Evaluation of current treatment plan related to cancer treatment and patient's adherence to plan as established by provider Advised patient to to call the office for questions, concerns, or new needs Advised patient to discuss changes in sx and sx  with provider Screening for signs and symptoms of depression related to chronic disease state  Assessed social determinant of health barriers The patient is now enrolled with Hospice. The patients daughter states they started working with him lat week . She is thankful for the support of Hospice and the support from the [facility] team. Reflective listening and support given.            SDOH assessments and interventions completed:  Yes  SDOH Interventions Today    Flowsheet Row Most Recent Value  SDOH Interventions   Food Insecurity Interventions Intervention Not Indicated  Housing Interventions Intervention Not Indicated  Transportation Interventions Intervention Not Indicated  Utilities Interventions Intervention Not Indicated  Alcohol Usage Interventions Intervention Not Indicated (Score <7)  Financial Strain Interventions Intervention Not Indicated  Stress Interventions Intervention Not Indicated        Care Coordination Interventions Activated:  Yes  Care Coordination Interventions:  Yes, provided   Follow up plan: No further intervention required.   Encounter Outcome:  Pt. Visit Completed   Noreene Larsson RN, MSN,  [place]  Mobile: [phone]

## 2021-12-01 ENCOUNTER — Encounter: Payer: Self-pay | Admitting: Oncology

## 2021-12-01 ENCOUNTER — Other Ambulatory Visit: Payer: Self-pay

## 2021-12-01 DIAGNOSIS — K831 Obstruction of bile duct: Secondary | ICD-10-CM | POA: Diagnosis not present

## 2021-12-01 DIAGNOSIS — E119 Type 2 diabetes mellitus without complications: Secondary | ICD-10-CM | POA: Diagnosis not present

## 2021-12-01 DIAGNOSIS — I10 Essential (primary) hypertension: Secondary | ICD-10-CM | POA: Diagnosis not present

## 2021-12-01 DIAGNOSIS — C259 Malignant neoplasm of pancreas, unspecified: Secondary | ICD-10-CM | POA: Diagnosis not present

## 2021-12-01 DIAGNOSIS — C7970 Secondary malignant neoplasm of unspecified adrenal gland: Secondary | ICD-10-CM | POA: Diagnosis not present

## 2021-12-01 DIAGNOSIS — C787 Secondary malignant neoplasm of liver and intrahepatic bile duct: Secondary | ICD-10-CM | POA: Diagnosis not present

## 2021-12-02 ENCOUNTER — Telehealth: Payer: Self-pay

## 2021-12-02 NOTE — Telephone Encounter (Signed)
E-mailed the FMLA paperwork to pts daughter and made her aware that it was complete and to make me aware if she has any problems. Pts daughter understands and agrees.

## 2021-12-07 DIAGNOSIS — Z6822 Body mass index (BMI) 22.0-22.9, adult: Secondary | ICD-10-CM | POA: Diagnosis not present

## 2021-12-07 DIAGNOSIS — I10 Essential (primary) hypertension: Secondary | ICD-10-CM | POA: Diagnosis not present

## 2021-12-07 DIAGNOSIS — E785 Hyperlipidemia, unspecified: Secondary | ICD-10-CM | POA: Diagnosis not present

## 2021-12-07 DIAGNOSIS — J302 Other seasonal allergic rhinitis: Secondary | ICD-10-CM | POA: Diagnosis not present

## 2021-12-07 DIAGNOSIS — M3500 Sicca syndrome, unspecified: Secondary | ICD-10-CM | POA: Diagnosis not present

## 2021-12-07 DIAGNOSIS — K831 Obstruction of bile duct: Secondary | ICD-10-CM | POA: Diagnosis not present

## 2021-12-07 DIAGNOSIS — C259 Malignant neoplasm of pancreas, unspecified: Secondary | ICD-10-CM | POA: Diagnosis not present

## 2021-12-07 DIAGNOSIS — C7970 Secondary malignant neoplasm of unspecified adrenal gland: Secondary | ICD-10-CM | POA: Diagnosis not present

## 2021-12-07 DIAGNOSIS — Z8546 Personal history of malignant neoplasm of prostate: Secondary | ICD-10-CM | POA: Diagnosis not present

## 2021-12-07 DIAGNOSIS — K219 Gastro-esophageal reflux disease without esophagitis: Secondary | ICD-10-CM | POA: Diagnosis not present

## 2021-12-07 DIAGNOSIS — C787 Secondary malignant neoplasm of liver and intrahepatic bile duct: Secondary | ICD-10-CM | POA: Diagnosis not present

## 2021-12-07 DIAGNOSIS — E119 Type 2 diabetes mellitus without complications: Secondary | ICD-10-CM | POA: Diagnosis not present

## 2021-12-07 DIAGNOSIS — J449 Chronic obstructive pulmonary disease, unspecified: Secondary | ICD-10-CM | POA: Diagnosis not present

## 2021-12-08 DIAGNOSIS — C259 Malignant neoplasm of pancreas, unspecified: Secondary | ICD-10-CM | POA: Diagnosis not present

## 2021-12-08 DIAGNOSIS — E119 Type 2 diabetes mellitus without complications: Secondary | ICD-10-CM | POA: Diagnosis not present

## 2021-12-08 DIAGNOSIS — C7970 Secondary malignant neoplasm of unspecified adrenal gland: Secondary | ICD-10-CM | POA: Diagnosis not present

## 2021-12-08 DIAGNOSIS — I10 Essential (primary) hypertension: Secondary | ICD-10-CM | POA: Diagnosis not present

## 2021-12-08 DIAGNOSIS — C787 Secondary malignant neoplasm of liver and intrahepatic bile duct: Secondary | ICD-10-CM | POA: Diagnosis not present

## 2021-12-08 DIAGNOSIS — K831 Obstruction of bile duct: Secondary | ICD-10-CM | POA: Diagnosis not present

## 2021-12-15 ENCOUNTER — Telehealth: Payer: Self-pay | Admitting: *Deleted

## 2021-12-15 DIAGNOSIS — I10 Essential (primary) hypertension: Secondary | ICD-10-CM | POA: Diagnosis not present

## 2021-12-15 DIAGNOSIS — K831 Obstruction of bile duct: Secondary | ICD-10-CM | POA: Diagnosis not present

## 2021-12-15 DIAGNOSIS — C259 Malignant neoplasm of pancreas, unspecified: Secondary | ICD-10-CM | POA: Diagnosis not present

## 2021-12-15 DIAGNOSIS — C787 Secondary malignant neoplasm of liver and intrahepatic bile duct: Secondary | ICD-10-CM | POA: Diagnosis not present

## 2021-12-15 DIAGNOSIS — E119 Type 2 diabetes mellitus without complications: Secondary | ICD-10-CM | POA: Diagnosis not present

## 2021-12-15 DIAGNOSIS — C7970 Secondary malignant neoplasm of unspecified adrenal gland: Secondary | ICD-10-CM | POA: Diagnosis not present

## 2021-12-15 NOTE — Telephone Encounter (Signed)
I spoke with hospice nurse. Agreed to starting dexamethasone, which may help with appetite. Dexamethasone '2mg'$  daily. Discussed biotene for dry mouth.

## 2021-12-15 NOTE — Telephone Encounter (Signed)
Hospice nurse Kenney Houseman called stating patient is requesting medicine to stimulate appetite and that he has lost 10 lbs in the past month

## 2021-12-17 DIAGNOSIS — C787 Secondary malignant neoplasm of liver and intrahepatic bile duct: Secondary | ICD-10-CM | POA: Diagnosis not present

## 2021-12-17 DIAGNOSIS — E119 Type 2 diabetes mellitus without complications: Secondary | ICD-10-CM | POA: Diagnosis not present

## 2021-12-17 DIAGNOSIS — I10 Essential (primary) hypertension: Secondary | ICD-10-CM | POA: Diagnosis not present

## 2021-12-17 DIAGNOSIS — C7970 Secondary malignant neoplasm of unspecified adrenal gland: Secondary | ICD-10-CM | POA: Diagnosis not present

## 2021-12-17 DIAGNOSIS — K831 Obstruction of bile duct: Secondary | ICD-10-CM | POA: Diagnosis not present

## 2021-12-17 DIAGNOSIS — C259 Malignant neoplasm of pancreas, unspecified: Secondary | ICD-10-CM | POA: Diagnosis not present

## 2021-12-22 DIAGNOSIS — I10 Essential (primary) hypertension: Secondary | ICD-10-CM | POA: Diagnosis not present

## 2021-12-22 DIAGNOSIS — C787 Secondary malignant neoplasm of liver and intrahepatic bile duct: Secondary | ICD-10-CM | POA: Diagnosis not present

## 2021-12-22 DIAGNOSIS — C7970 Secondary malignant neoplasm of unspecified adrenal gland: Secondary | ICD-10-CM | POA: Diagnosis not present

## 2021-12-22 DIAGNOSIS — E119 Type 2 diabetes mellitus without complications: Secondary | ICD-10-CM | POA: Diagnosis not present

## 2021-12-22 DIAGNOSIS — C259 Malignant neoplasm of pancreas, unspecified: Secondary | ICD-10-CM | POA: Diagnosis not present

## 2021-12-22 DIAGNOSIS — K831 Obstruction of bile duct: Secondary | ICD-10-CM | POA: Diagnosis not present

## 2021-12-23 DIAGNOSIS — E119 Type 2 diabetes mellitus without complications: Secondary | ICD-10-CM | POA: Diagnosis not present

## 2021-12-23 DIAGNOSIS — I10 Essential (primary) hypertension: Secondary | ICD-10-CM | POA: Diagnosis not present

## 2021-12-23 DIAGNOSIS — K831 Obstruction of bile duct: Secondary | ICD-10-CM | POA: Diagnosis not present

## 2021-12-23 DIAGNOSIS — C787 Secondary malignant neoplasm of liver and intrahepatic bile duct: Secondary | ICD-10-CM | POA: Diagnosis not present

## 2021-12-23 DIAGNOSIS — C259 Malignant neoplasm of pancreas, unspecified: Secondary | ICD-10-CM | POA: Diagnosis not present

## 2021-12-23 DIAGNOSIS — C7970 Secondary malignant neoplasm of unspecified adrenal gland: Secondary | ICD-10-CM | POA: Diagnosis not present

## 2021-12-26 ENCOUNTER — Telehealth: Payer: Self-pay | Admitting: *Deleted

## 2021-12-26 DIAGNOSIS — C7970 Secondary malignant neoplasm of unspecified adrenal gland: Secondary | ICD-10-CM | POA: Diagnosis not present

## 2021-12-26 DIAGNOSIS — I10 Essential (primary) hypertension: Secondary | ICD-10-CM | POA: Diagnosis not present

## 2021-12-26 DIAGNOSIS — C259 Malignant neoplasm of pancreas, unspecified: Secondary | ICD-10-CM | POA: Diagnosis not present

## 2021-12-26 DIAGNOSIS — E119 Type 2 diabetes mellitus without complications: Secondary | ICD-10-CM | POA: Diagnosis not present

## 2021-12-26 DIAGNOSIS — K831 Obstruction of bile duct: Secondary | ICD-10-CM | POA: Diagnosis not present

## 2021-12-26 DIAGNOSIS — C787 Secondary malignant neoplasm of liver and intrahepatic bile duct: Secondary | ICD-10-CM | POA: Diagnosis not present

## 2021-12-26 NOTE — Telephone Encounter (Signed)
I spoke with RN. Orders given for Zofran '8mg'$  Q8H PRN and Compazine '10mg'$  Q6H PRN for nausea.

## 2021-12-26 NOTE — Telephone Encounter (Signed)
[facility] nurse called reporting that patient is having daily bouts of nausea and has nothing ordered for it. She is asking for medicine ot be sent to Michael Murphy and to e notified of what is ordered. Please advise

## 2021-12-26 NOTE — Telephone Encounter (Signed)
VM left 

## 2021-12-29 DIAGNOSIS — E119 Type 2 diabetes mellitus without complications: Secondary | ICD-10-CM | POA: Diagnosis not present

## 2021-12-29 DIAGNOSIS — I10 Essential (primary) hypertension: Secondary | ICD-10-CM | POA: Diagnosis not present

## 2021-12-29 DIAGNOSIS — C7970 Secondary malignant neoplasm of unspecified adrenal gland: Secondary | ICD-10-CM | POA: Diagnosis not present

## 2021-12-29 DIAGNOSIS — K831 Obstruction of bile duct: Secondary | ICD-10-CM | POA: Diagnosis not present

## 2021-12-29 DIAGNOSIS — C787 Secondary malignant neoplasm of liver and intrahepatic bile duct: Secondary | ICD-10-CM | POA: Diagnosis not present

## 2021-12-29 DIAGNOSIS — C259 Malignant neoplasm of pancreas, unspecified: Secondary | ICD-10-CM | POA: Diagnosis not present

## 2022-01-05 ENCOUNTER — Ambulatory Visit: Payer: Medicare Other | Admitting: Gastroenterology

## 2022-01-05 DIAGNOSIS — C259 Malignant neoplasm of pancreas, unspecified: Secondary | ICD-10-CM | POA: Diagnosis not present

## 2022-01-05 DIAGNOSIS — K831 Obstruction of bile duct: Secondary | ICD-10-CM | POA: Diagnosis not present

## 2022-01-05 DIAGNOSIS — C7970 Secondary malignant neoplasm of unspecified adrenal gland: Secondary | ICD-10-CM | POA: Diagnosis not present

## 2022-01-05 DIAGNOSIS — E119 Type 2 diabetes mellitus without complications: Secondary | ICD-10-CM | POA: Diagnosis not present

## 2022-01-05 DIAGNOSIS — I10 Essential (primary) hypertension: Secondary | ICD-10-CM | POA: Diagnosis not present

## 2022-01-05 DIAGNOSIS — C787 Secondary malignant neoplasm of liver and intrahepatic bile duct: Secondary | ICD-10-CM | POA: Diagnosis not present

## 2022-01-07 DIAGNOSIS — C7970 Secondary malignant neoplasm of unspecified adrenal gland: Secondary | ICD-10-CM | POA: Diagnosis not present

## 2022-01-07 DIAGNOSIS — E785 Hyperlipidemia, unspecified: Secondary | ICD-10-CM | POA: Diagnosis not present

## 2022-01-07 DIAGNOSIS — C787 Secondary malignant neoplasm of liver and intrahepatic bile duct: Secondary | ICD-10-CM | POA: Diagnosis not present

## 2022-01-07 DIAGNOSIS — K219 Gastro-esophageal reflux disease without esophagitis: Secondary | ICD-10-CM | POA: Diagnosis not present

## 2022-01-07 DIAGNOSIS — C259 Malignant neoplasm of pancreas, unspecified: Secondary | ICD-10-CM | POA: Diagnosis not present

## 2022-01-07 DIAGNOSIS — E119 Type 2 diabetes mellitus without complications: Secondary | ICD-10-CM | POA: Diagnosis not present

## 2022-01-07 DIAGNOSIS — Z6822 Body mass index (BMI) 22.0-22.9, adult: Secondary | ICD-10-CM | POA: Diagnosis not present

## 2022-01-07 DIAGNOSIS — J302 Other seasonal allergic rhinitis: Secondary | ICD-10-CM | POA: Diagnosis not present

## 2022-01-07 DIAGNOSIS — I10 Essential (primary) hypertension: Secondary | ICD-10-CM | POA: Diagnosis not present

## 2022-01-07 DIAGNOSIS — K831 Obstruction of bile duct: Secondary | ICD-10-CM | POA: Diagnosis not present

## 2022-01-07 DIAGNOSIS — M3500 Sicca syndrome, unspecified: Secondary | ICD-10-CM | POA: Diagnosis not present

## 2022-01-07 DIAGNOSIS — Z8546 Personal history of malignant neoplasm of prostate: Secondary | ICD-10-CM | POA: Diagnosis not present

## 2022-01-07 DIAGNOSIS — J449 Chronic obstructive pulmonary disease, unspecified: Secondary | ICD-10-CM | POA: Diagnosis not present

## 2022-01-09 ENCOUNTER — Ambulatory Visit: Payer: Medicare Other | Admitting: Urology

## 2022-01-09 ENCOUNTER — Encounter: Payer: Self-pay | Admitting: Urology

## 2022-01-12 DIAGNOSIS — E119 Type 2 diabetes mellitus without complications: Secondary | ICD-10-CM | POA: Diagnosis not present

## 2022-01-12 DIAGNOSIS — C7970 Secondary malignant neoplasm of unspecified adrenal gland: Secondary | ICD-10-CM | POA: Diagnosis not present

## 2022-01-12 DIAGNOSIS — C259 Malignant neoplasm of pancreas, unspecified: Secondary | ICD-10-CM | POA: Diagnosis not present

## 2022-01-12 DIAGNOSIS — C787 Secondary malignant neoplasm of liver and intrahepatic bile duct: Secondary | ICD-10-CM | POA: Diagnosis not present

## 2022-01-12 DIAGNOSIS — I10 Essential (primary) hypertension: Secondary | ICD-10-CM | POA: Diagnosis not present

## 2022-01-12 DIAGNOSIS — K831 Obstruction of bile duct: Secondary | ICD-10-CM | POA: Diagnosis not present

## 2022-01-13 DIAGNOSIS — C787 Secondary malignant neoplasm of liver and intrahepatic bile duct: Secondary | ICD-10-CM | POA: Diagnosis not present

## 2022-01-13 DIAGNOSIS — K831 Obstruction of bile duct: Secondary | ICD-10-CM | POA: Diagnosis not present

## 2022-01-13 DIAGNOSIS — C7970 Secondary malignant neoplasm of unspecified adrenal gland: Secondary | ICD-10-CM | POA: Diagnosis not present

## 2022-01-13 DIAGNOSIS — I10 Essential (primary) hypertension: Secondary | ICD-10-CM | POA: Diagnosis not present

## 2022-01-13 DIAGNOSIS — E119 Type 2 diabetes mellitus without complications: Secondary | ICD-10-CM | POA: Diagnosis not present

## 2022-01-13 DIAGNOSIS — C259 Malignant neoplasm of pancreas, unspecified: Secondary | ICD-10-CM | POA: Diagnosis not present

## 2022-01-19 DIAGNOSIS — C259 Malignant neoplasm of pancreas, unspecified: Secondary | ICD-10-CM | POA: Diagnosis not present

## 2022-01-19 DIAGNOSIS — C7970 Secondary malignant neoplasm of unspecified adrenal gland: Secondary | ICD-10-CM | POA: Diagnosis not present

## 2022-01-19 DIAGNOSIS — E119 Type 2 diabetes mellitus without complications: Secondary | ICD-10-CM | POA: Diagnosis not present

## 2022-01-19 DIAGNOSIS — I10 Essential (primary) hypertension: Secondary | ICD-10-CM | POA: Diagnosis not present

## 2022-01-19 DIAGNOSIS — K831 Obstruction of bile duct: Secondary | ICD-10-CM | POA: Diagnosis not present

## 2022-01-19 DIAGNOSIS — C787 Secondary malignant neoplasm of liver and intrahepatic bile duct: Secondary | ICD-10-CM | POA: Diagnosis not present

## 2022-01-20 ENCOUNTER — Encounter: Payer: Self-pay | Admitting: Family Medicine

## 2022-01-20 ENCOUNTER — Telehealth: Payer: Self-pay | Admitting: *Deleted

## 2022-01-20 ENCOUNTER — Ambulatory Visit (INDEPENDENT_AMBULATORY_CARE_PROVIDER_SITE_OTHER): Payer: Medicare Other | Admitting: Family Medicine

## 2022-01-20 VITALS — BP 117/63 | HR 94 | Temp 94.9°F | Resp 16 | Ht 68.0 in | Wt 136.0 lb

## 2022-01-20 DIAGNOSIS — E1149 Type 2 diabetes mellitus with other diabetic neurological complication: Secondary | ICD-10-CM | POA: Diagnosis not present

## 2022-01-20 DIAGNOSIS — I10 Essential (primary) hypertension: Secondary | ICD-10-CM | POA: Diagnosis not present

## 2022-01-20 DIAGNOSIS — C250 Malignant neoplasm of head of pancreas: Secondary | ICD-10-CM

## 2022-01-20 DIAGNOSIS — E119 Type 2 diabetes mellitus without complications: Secondary | ICD-10-CM | POA: Diagnosis not present

## 2022-01-20 MED ORDER — POLYETHYLENE GLYCOL 3350 17 GM/SCOOP PO POWD: 17.0000 g | Freq: Two times a day (BID) | ORAL | 1 refills | Status: AC | PRN

## 2022-01-20 MED ORDER — SENNOSIDES 8.6 MG PO TABS: 1.0000 | ORAL_TABLET | Freq: Every day | ORAL | 2 refills | Status: AC

## 2022-01-20 MED ORDER — GABAPENTIN 100 MG PO CAPS: 100.0000 mg | ORAL_CAPSULE | Freq: Every evening | ORAL | 3 refills | Status: DC | PRN

## 2022-01-20 MED ORDER — MIRTAZAPINE 30 MG PO TBDP: 30.0000 mg | ORAL_TABLET | Freq: Every day | ORAL | 2 refills | Status: AC

## 2022-01-20 NOTE — Assessment & Plan Note (Signed)
Patient and family decided to enroll patient in hospice reviewed medications and discussed tingling medications that controls symptoms continue any medications that would not contribute to improve quality of life We discussed discontinuing metformin, discontinuing blood glucose checks, discontinuing simvastatin and discontinuing aspirin Patient will continue oxycodone 5 mg as needed every 8 hours, patient is taking less than 5 mg, reported 25 mg average in every 12 hours on the days that is needed In order to allow for flexibility for increased symptoms or severity of symptoms, recommended continuing prescription as prescribed in the event that he does need the full tablet or need the medication more frequently, patient and his daughter voiced understanding

## 2022-01-20 NOTE — Assessment & Plan Note (Signed)
Patient will discontinue glucose checks We will discontinue metformin

## 2022-01-20 NOTE — Telephone Encounter (Signed)
VM left 

## 2022-01-20 NOTE — Assessment & Plan Note (Addendum)
Report of neuropathy Discussed potential side effects of gabapentin including somnolence and/or dizziness We will start with 100 mg at bedtime as needed for neuropathy

## 2022-01-20 NOTE — Patient Instructions (Signed)
It was a pleasure to see you today!  Thank you for choosing [facility] for your primary care.   Michael Murphy was seen for medication management.   Our plans for today were: Continue Oxycodone as needed as you have been taking, he can do the 1/2 or whole tablet if pain increases in frequency or severity  I have provided refills for the Mirtazepine for appetite  I have prescribed gabapentin '100mg'$  at bedtime as needed for the tingling symptoms in the feet  STOP taking:  -Aspirin  -Simvastatin  -Metformin  -no longer need to check BG     You should return to our clinic as needed. Call for any medication adjustments to limit [name] needing to physically come in for appointments.    Thank you for allowing me to take part in your care,  Dr. Quentin Cornwall

## 2022-01-20 NOTE — Telephone Encounter (Signed)
Michael Murphy called stating patient is asking what medications he is taking can be stopped to lessen his pill burden, one specific e is wanting to stop is the ASA. Please Arnoldo Hooker

## 2022-01-20 NOTE — Progress Notes (Signed)
I,Michael Murphy,acting as a scribe for [employer], MD.,have documented all relevant documentation on the behalf of Michael Foster, MD,as directed by  Michael Foster, MD while in the presence of Michael Foster, MD.   Established patient visit   Patient: Michael Murphy   DOB: 1927-09-03   88 y.o. Male  MRN: [id number] Visit Date: 01/20/2022  Today's healthcare provider: Eulis Foster, MD   Chief Complaint  Patient presents with   Follow-up   Subjective    HPI  Patient here for 2 months follow-up medication managment. He presents today in good spirits with his daughter, Michael Murphy, who has been has caregiver and moved from [place] Murphy in 2022  They report that Mr. Michael Murphy is currently enrolled in hospice care with [facility] and would like to discuss medication changes/prescriptions   They deny problems with hypotension while on the antihypertensive medications  They report patient has had dizziness 2/2 to vertigo for some time so he takes meclizine for these symptoms and makes sure to rise from seated positions carefully before ambulating  He has been having nausea, taking Zofran every 8 hours as needed, reports he has only needed it 2-3 times   He has been having tingling in his toes as well that does not seem to be improved with other medications  Pain has been mostly in the right leg as well as some epigastric "nawing" pain intermittently that is improved with 1/2 tab of the oxycodone '5mg'$    Patient has questions about his blood sugar and metformin   Today we discussed stopping the following medications:  -ASA -Simvastatin  -Metformin  -no longer needs to check BG     Medications: Outpatient Medications Prior to Visit  Medication Sig   albuterol (PROVENTIL HFA;VENTOLIN HFA) 108 (90 Base) MCG/ACT inhaler Inhale 2 puffs into the lungs every 6 (six) hours as needed for wheezing or shortness of breath.   amLODipine  (NORVASC) 10 MG tablet Take 10 mg by mouth daily.   Coenzyme Q10 (CO Q 10 PO) Take by mouth.   fluticasone (FLONASE) 50 MCG/ACT nasal spray Place 1 spray into both nostrils as needed.    losartan (COZAAR) 50 MG tablet Take 1 tablet by mouth daily.   LUBRICATING PLUS EYE DROPS 0.5 % SOLN Apply to eye.   meclizine (ANTIVERT) 25 MG tablet Take 1 tablet (25 mg total) by mouth as needed for dizziness.   Multiple Vitamins-Minerals (MENS MULTIVITAMIN PLUS PO) Take by mouth daily.   omeprazole (PRILOSEC) 20 MG capsule Take 1 capsule (20 mg total) by mouth daily.   oxyCODONE (OXY IR/ROXICODONE) 5 MG immediate release tablet Take 1 tablet (5 mg total) by mouth every 8 (eight) hours as needed for severe pain.   sucralfate (CARAFATE) 1 g tablet Take 1 tablet (1 g total) by mouth 4 (four) times daily -  with meals and at bedtime.   tiotropium (SPIRIVA) 18 MCG inhalation capsule Place 18 mcg into inhaler and inhale every morning.   triamcinolone ointment (KENALOG) 0.1 % APPLY TOPICALLY TO AFFECTED AREA(S) WITH RASH ONCE TO TWICE DAILY AS NEEDED. AVOID FACE, GROIN, AND AXILLA.   WIXELA INHUB 250-50 MCG/ACT AEPB Inhale 1 puff into the lungs 2 (two) times daily.   [DISCONTINUED] aspirin 81 MG chewable tablet Chew by mouth daily.   [DISCONTINUED] metFORMIN (GLUCOPHAGE) 1000 MG tablet TAKE ONE TABLET BY MOUTH TWICE A DAY WITH A MEAL   [DISCONTINUED] mirtazapine (REMERON SOL-TAB) 30 MG disintegrating tablet Take 1 tablet (30  mg total) by mouth at bedtime.   [DISCONTINUED] simvastatin (ZOCOR) 80 MG tablet Take by mouth.   No facility-administered medications prior to visit.    Review of Systems     Objective    BP 117/63 (BP Location: Right Arm, Patient Position: Sitting, Cuff Size: Normal)   Pulse 94   Temp (!) 94.9 F (34.9 C) (Oral)   Resp 16   Ht '5\' 8"'$  (1.727 m)   Wt 136 lb (61.7 kg)   SpO2 100%   BMI 20.68 kg/m    Physical Exam Vitals reviewed.  Constitutional:      General: He is not in  acute distress.    Appearance: Normal appearance. He is not ill-appearing, toxic-appearing or diaphoretic.  Eyes:     Conjunctiva/sclera: Conjunctivae normal.  Cardiovascular:     Rate and Rhythm: Normal rate and regular rhythm.     Pulses: Normal pulses.     Heart sounds:     No friction rub. Gallop present.  Pulmonary:     Effort: Pulmonary effort is normal. No respiratory distress.     Breath sounds: Normal breath sounds. No stridor. No wheezing, rhonchi or rales.  Abdominal:     General: Bowel sounds are normal. There is no distension.     Palpations: Abdomen is soft.     Tenderness: There is abdominal tenderness.  Musculoskeletal:     Right lower leg: No edema.     Left lower leg: No edema.  Skin:    Findings: No erythema or rash.  Neurological:     Mental Status: He is alert and oriented to person, place, and time.       No results found for any visits on 01/20/22.  Assessment & Plan     Problem List Items Addressed This Visit       Cardiovascular and Mediastinum   Benign essential HTN    Blood pressure is within goal range He will continue amlodipine 10 mg daily, losartan 50 mg daily Recommended that patient have blood pressure checked at least once daily to make sure that patient is not having hypotension which we discussed would be blood pressure is less than 100/70 Both patient and his daughter voiced understanding We will continue blood pressure medications as prescribed unless he begins to have hypotension        Digestive   Malignant neoplasm of head of pancreas [place]) - Primary    Patient and family decided to enroll patient in hospice reviewed medications and discussed tingling medications that controls symptoms continue any medications that would not contribute to improve quality of life We discussed discontinuing metformin, discontinuing blood glucose checks, discontinuing simvastatin and discontinuing aspirin Patient will continue oxycodone 5 mg as  needed every 8 hours, patient is taking less than 5 mg, reported 25 mg average in every 12 hours on the days that is needed In order to allow for flexibility for increased symptoms or severity of symptoms, recommended continuing prescription as prescribed in the event that he does need the full tablet or need the medication more frequently, patient and his daughter voiced understanding        Endocrine   Diabetic neuropathy ([place])    Report of neuropathy Discussed potential side effects of gabapentin including somnolence and/or dizziness We will start with 100 mg at bedtime as needed for neuropathy       Type 2 diabetes mellitus without complication, without long-term current use of insulin ([place])    Patient will discontinue glucose  checks We will discontinue metformin        Return if symptoms worsen or fail to improve.      I, Michael Foster, MD, have reviewed all documentation for this visit. The documentation on 01/20/22 for the exam, diagnosis, procedures, and orders are all accurate and complete.  Portions of this information were initially documented by the CMA and reviewed by me for thoroughness and accuracy.      Michael Foster, MD  [facility] [phone] (phone) [phone] (fax)  [place]

## 2022-01-20 NOTE — Assessment & Plan Note (Signed)
Blood pressure is within goal range He will continue amlodipine 10 mg daily, losartan 50 mg daily Recommended that patient have blood pressure checked at least once daily to make sure that patient is not having hypotension which we discussed would be blood pressure is less than 100/70 Both patient and his daughter voiced understanding We will continue blood pressure medications as prescribed unless he begins to have hypotension

## 2022-01-26 DIAGNOSIS — I10 Essential (primary) hypertension: Secondary | ICD-10-CM | POA: Diagnosis not present

## 2022-01-26 DIAGNOSIS — K831 Obstruction of bile duct: Secondary | ICD-10-CM | POA: Diagnosis not present

## 2022-01-26 DIAGNOSIS — C259 Malignant neoplasm of pancreas, unspecified: Secondary | ICD-10-CM | POA: Diagnosis not present

## 2022-01-26 DIAGNOSIS — E119 Type 2 diabetes mellitus without complications: Secondary | ICD-10-CM | POA: Diagnosis not present

## 2022-01-26 DIAGNOSIS — C787 Secondary malignant neoplasm of liver and intrahepatic bile duct: Secondary | ICD-10-CM | POA: Diagnosis not present

## 2022-01-26 DIAGNOSIS — C7970 Secondary malignant neoplasm of unspecified adrenal gland: Secondary | ICD-10-CM | POA: Diagnosis not present

## 2022-02-06 DIAGNOSIS — E785 Hyperlipidemia, unspecified: Secondary | ICD-10-CM | POA: Diagnosis not present

## 2022-02-06 DIAGNOSIS — C259 Malignant neoplasm of pancreas, unspecified: Secondary | ICD-10-CM | POA: Diagnosis not present

## 2022-02-06 DIAGNOSIS — E119 Type 2 diabetes mellitus without complications: Secondary | ICD-10-CM | POA: Diagnosis not present

## 2022-02-06 DIAGNOSIS — Z8546 Personal history of malignant neoplasm of prostate: Secondary | ICD-10-CM | POA: Diagnosis not present

## 2022-02-06 DIAGNOSIS — C7970 Secondary malignant neoplasm of unspecified adrenal gland: Secondary | ICD-10-CM | POA: Diagnosis not present

## 2022-02-06 DIAGNOSIS — M3500 Sicca syndrome, unspecified: Secondary | ICD-10-CM | POA: Diagnosis not present

## 2022-02-06 DIAGNOSIS — J302 Other seasonal allergic rhinitis: Secondary | ICD-10-CM | POA: Diagnosis not present

## 2022-02-06 DIAGNOSIS — K831 Obstruction of bile duct: Secondary | ICD-10-CM | POA: Diagnosis not present

## 2022-02-06 DIAGNOSIS — K219 Gastro-esophageal reflux disease without esophagitis: Secondary | ICD-10-CM | POA: Diagnosis not present

## 2022-02-06 DIAGNOSIS — Z6822 Body mass index (BMI) 22.0-22.9, adult: Secondary | ICD-10-CM | POA: Diagnosis not present

## 2022-02-06 DIAGNOSIS — I10 Essential (primary) hypertension: Secondary | ICD-10-CM | POA: Diagnosis not present

## 2022-02-06 DIAGNOSIS — J449 Chronic obstructive pulmonary disease, unspecified: Secondary | ICD-10-CM | POA: Diagnosis not present

## 2022-02-06 DIAGNOSIS — C787 Secondary malignant neoplasm of liver and intrahepatic bile duct: Secondary | ICD-10-CM | POA: Diagnosis not present

## 2022-02-09 DIAGNOSIS — C7970 Secondary malignant neoplasm of unspecified adrenal gland: Secondary | ICD-10-CM | POA: Diagnosis not present

## 2022-02-09 DIAGNOSIS — E119 Type 2 diabetes mellitus without complications: Secondary | ICD-10-CM | POA: Diagnosis not present

## 2022-02-09 DIAGNOSIS — K831 Obstruction of bile duct: Secondary | ICD-10-CM | POA: Diagnosis not present

## 2022-02-09 DIAGNOSIS — I10 Essential (primary) hypertension: Secondary | ICD-10-CM | POA: Diagnosis not present

## 2022-02-09 DIAGNOSIS — C259 Malignant neoplasm of pancreas, unspecified: Secondary | ICD-10-CM | POA: Diagnosis not present

## 2022-02-09 DIAGNOSIS — C787 Secondary malignant neoplasm of liver and intrahepatic bile duct: Secondary | ICD-10-CM | POA: Diagnosis not present

## 2022-02-16 DIAGNOSIS — C787 Secondary malignant neoplasm of liver and intrahepatic bile duct: Secondary | ICD-10-CM | POA: Diagnosis not present

## 2022-02-16 DIAGNOSIS — K831 Obstruction of bile duct: Secondary | ICD-10-CM | POA: Diagnosis not present

## 2022-02-16 DIAGNOSIS — E119 Type 2 diabetes mellitus without complications: Secondary | ICD-10-CM | POA: Diagnosis not present

## 2022-02-16 DIAGNOSIS — C259 Malignant neoplasm of pancreas, unspecified: Secondary | ICD-10-CM | POA: Diagnosis not present

## 2022-02-16 DIAGNOSIS — C7970 Secondary malignant neoplasm of unspecified adrenal gland: Secondary | ICD-10-CM | POA: Diagnosis not present

## 2022-02-16 DIAGNOSIS — I10 Essential (primary) hypertension: Secondary | ICD-10-CM | POA: Diagnosis not present

## 2022-02-23 ENCOUNTER — Telehealth: Payer: Self-pay | Admitting: *Deleted

## 2022-02-23 DIAGNOSIS — C7970 Secondary malignant neoplasm of unspecified adrenal gland: Secondary | ICD-10-CM | POA: Diagnosis not present

## 2022-02-23 DIAGNOSIS — C259 Malignant neoplasm of pancreas, unspecified: Secondary | ICD-10-CM | POA: Diagnosis not present

## 2022-02-23 DIAGNOSIS — K831 Obstruction of bile duct: Secondary | ICD-10-CM | POA: Diagnosis not present

## 2022-02-23 DIAGNOSIS — E119 Type 2 diabetes mellitus without complications: Secondary | ICD-10-CM | POA: Diagnosis not present

## 2022-02-23 DIAGNOSIS — I10 Essential (primary) hypertension: Secondary | ICD-10-CM | POA: Diagnosis not present

## 2022-02-23 DIAGNOSIS — C787 Secondary malignant neoplasm of liver and intrahepatic bile duct: Secondary | ICD-10-CM | POA: Diagnosis not present

## 2022-02-23 MED ORDER — OXYCODONE HCL 5 MG PO TABS: 5.0000 mg | ORAL_TABLET | ORAL | 0 refills | Status: DC | PRN

## 2022-02-23 MED ORDER — GABAPENTIN 300 MG PO CAPS: 300.0000 mg | ORAL_CAPSULE | Freq: Three times a day (TID) | ORAL | 1 refills | Status: AC

## 2022-02-23 NOTE — Telephone Encounter (Signed)
Call from hospice nurse Kathlee Nations requesting to increase patient Oxycodone 5 mg and his Gabapentin doses due to pain not being controlled and tingling in his feet. Please advise

## 2022-02-23 NOTE — Telephone Encounter (Signed)
I spoke with hospice nurse, Kathlee Nations.  Pain is worse in abdomen, likely secondary to progressive pancreatic cancer.  No abdominal distention or constipation or nausea vomiting okay to increase oxycodone 5 mg every 4 hours as needed.  Also okay to slowly titrate gabapentin up to 300 mg 3 times daily as tolerated.

## 2022-03-04 DIAGNOSIS — E119 Type 2 diabetes mellitus without complications: Secondary | ICD-10-CM | POA: Diagnosis not present

## 2022-03-04 DIAGNOSIS — C787 Secondary malignant neoplasm of liver and intrahepatic bile duct: Secondary | ICD-10-CM | POA: Diagnosis not present

## 2022-03-04 DIAGNOSIS — K831 Obstruction of bile duct: Secondary | ICD-10-CM | POA: Diagnosis not present

## 2022-03-04 DIAGNOSIS — C259 Malignant neoplasm of pancreas, unspecified: Secondary | ICD-10-CM | POA: Diagnosis not present

## 2022-03-04 DIAGNOSIS — C7970 Secondary malignant neoplasm of unspecified adrenal gland: Secondary | ICD-10-CM | POA: Diagnosis not present

## 2022-03-04 DIAGNOSIS — I10 Essential (primary) hypertension: Secondary | ICD-10-CM | POA: Diagnosis not present

## 2022-03-09 DIAGNOSIS — M3500 Sicca syndrome, unspecified: Secondary | ICD-10-CM | POA: Diagnosis not present

## 2022-03-09 DIAGNOSIS — I10 Essential (primary) hypertension: Secondary | ICD-10-CM | POA: Diagnosis not present

## 2022-03-09 DIAGNOSIS — K219 Gastro-esophageal reflux disease without esophagitis: Secondary | ICD-10-CM | POA: Diagnosis not present

## 2022-03-09 DIAGNOSIS — M1990 Unspecified osteoarthritis, unspecified site: Secondary | ICD-10-CM | POA: Diagnosis not present

## 2022-03-09 DIAGNOSIS — C787 Secondary malignant neoplasm of liver and intrahepatic bile duct: Secondary | ICD-10-CM | POA: Diagnosis not present

## 2022-03-09 DIAGNOSIS — K819 Cholecystitis, unspecified: Secondary | ICD-10-CM | POA: Diagnosis not present

## 2022-03-09 DIAGNOSIS — Z8739 Personal history of other diseases of the musculoskeletal system and connective tissue: Secondary | ICD-10-CM | POA: Diagnosis not present

## 2022-03-09 DIAGNOSIS — J449 Chronic obstructive pulmonary disease, unspecified: Secondary | ICD-10-CM | POA: Diagnosis not present

## 2022-03-09 DIAGNOSIS — K831 Obstruction of bile duct: Secondary | ICD-10-CM | POA: Diagnosis not present

## 2022-03-09 DIAGNOSIS — L309 Dermatitis, unspecified: Secondary | ICD-10-CM | POA: Diagnosis not present

## 2022-03-09 DIAGNOSIS — Z741 Need for assistance with personal care: Secondary | ICD-10-CM | POA: Diagnosis not present

## 2022-03-09 DIAGNOSIS — Z681 Body mass index (BMI) 19 or less, adult: Secondary | ICD-10-CM | POA: Diagnosis not present

## 2022-03-09 DIAGNOSIS — C7970 Secondary malignant neoplasm of unspecified adrenal gland: Secondary | ICD-10-CM | POA: Diagnosis not present

## 2022-03-09 DIAGNOSIS — J302 Other seasonal allergic rhinitis: Secondary | ICD-10-CM | POA: Diagnosis not present

## 2022-03-09 DIAGNOSIS — R627 Adult failure to thrive: Secondary | ICD-10-CM | POA: Diagnosis not present

## 2022-03-09 DIAGNOSIS — M810 Age-related osteoporosis without current pathological fracture: Secondary | ICD-10-CM | POA: Diagnosis not present

## 2022-03-09 DIAGNOSIS — R630 Anorexia: Secondary | ICD-10-CM | POA: Diagnosis not present

## 2022-03-09 DIAGNOSIS — F4310 Post-traumatic stress disorder, unspecified: Secondary | ICD-10-CM | POA: Diagnosis not present

## 2022-03-09 DIAGNOSIS — Z8546 Personal history of malignant neoplasm of prostate: Secondary | ICD-10-CM | POA: Diagnosis not present

## 2022-03-09 DIAGNOSIS — R634 Abnormal weight loss: Secondary | ICD-10-CM | POA: Diagnosis not present

## 2022-03-09 DIAGNOSIS — E785 Hyperlipidemia, unspecified: Secondary | ICD-10-CM | POA: Diagnosis not present

## 2022-03-09 DIAGNOSIS — E1142 Type 2 diabetes mellitus with diabetic polyneuropathy: Secondary | ICD-10-CM | POA: Diagnosis not present

## 2022-03-09 DIAGNOSIS — C259 Malignant neoplasm of pancreas, unspecified: Secondary | ICD-10-CM | POA: Diagnosis not present

## 2022-03-11 DIAGNOSIS — C259 Malignant neoplasm of pancreas, unspecified: Secondary | ICD-10-CM | POA: Diagnosis not present

## 2022-03-11 DIAGNOSIS — I10 Essential (primary) hypertension: Secondary | ICD-10-CM | POA: Diagnosis not present

## 2022-03-11 DIAGNOSIS — K219 Gastro-esophageal reflux disease without esophagitis: Secondary | ICD-10-CM | POA: Diagnosis not present

## 2022-03-11 DIAGNOSIS — K831 Obstruction of bile duct: Secondary | ICD-10-CM | POA: Diagnosis not present

## 2022-03-11 DIAGNOSIS — C7970 Secondary malignant neoplasm of unspecified adrenal gland: Secondary | ICD-10-CM | POA: Diagnosis not present

## 2022-03-11 DIAGNOSIS — C787 Secondary malignant neoplasm of liver and intrahepatic bile duct: Secondary | ICD-10-CM | POA: Diagnosis not present

## 2022-03-16 DIAGNOSIS — K831 Obstruction of bile duct: Secondary | ICD-10-CM | POA: Diagnosis not present

## 2022-03-16 DIAGNOSIS — C259 Malignant neoplasm of pancreas, unspecified: Secondary | ICD-10-CM | POA: Diagnosis not present

## 2022-03-16 DIAGNOSIS — K219 Gastro-esophageal reflux disease without esophagitis: Secondary | ICD-10-CM | POA: Diagnosis not present

## 2022-03-16 DIAGNOSIS — C787 Secondary malignant neoplasm of liver and intrahepatic bile duct: Secondary | ICD-10-CM | POA: Diagnosis not present

## 2022-03-16 DIAGNOSIS — C7970 Secondary malignant neoplasm of unspecified adrenal gland: Secondary | ICD-10-CM | POA: Diagnosis not present

## 2022-03-16 DIAGNOSIS — I10 Essential (primary) hypertension: Secondary | ICD-10-CM | POA: Diagnosis not present

## 2022-03-25 ENCOUNTER — Other Ambulatory Visit: Payer: Self-pay | Admitting: *Deleted

## 2022-03-25 DIAGNOSIS — K219 Gastro-esophageal reflux disease without esophagitis: Secondary | ICD-10-CM | POA: Diagnosis not present

## 2022-03-25 DIAGNOSIS — K831 Obstruction of bile duct: Secondary | ICD-10-CM | POA: Diagnosis not present

## 2022-03-25 DIAGNOSIS — C787 Secondary malignant neoplasm of liver and intrahepatic bile duct: Secondary | ICD-10-CM | POA: Diagnosis not present

## 2022-03-25 DIAGNOSIS — C259 Malignant neoplasm of pancreas, unspecified: Secondary | ICD-10-CM | POA: Diagnosis not present

## 2022-03-25 DIAGNOSIS — C7970 Secondary malignant neoplasm of unspecified adrenal gland: Secondary | ICD-10-CM | POA: Diagnosis not present

## 2022-03-25 DIAGNOSIS — I10 Essential (primary) hypertension: Secondary | ICD-10-CM | POA: Diagnosis not present

## 2022-03-25 MED ORDER — OXYCODONE HCL 5 MG PO TABS: 5.0000 mg | ORAL_TABLET | ORAL | 0 refills | Status: AC | PRN

## 2022-03-30 DIAGNOSIS — C7970 Secondary malignant neoplasm of unspecified adrenal gland: Secondary | ICD-10-CM | POA: Diagnosis not present

## 2022-03-30 DIAGNOSIS — C787 Secondary malignant neoplasm of liver and intrahepatic bile duct: Secondary | ICD-10-CM | POA: Diagnosis not present

## 2022-03-30 DIAGNOSIS — K219 Gastro-esophageal reflux disease without esophagitis: Secondary | ICD-10-CM | POA: Diagnosis not present

## 2022-03-30 DIAGNOSIS — C259 Malignant neoplasm of pancreas, unspecified: Secondary | ICD-10-CM | POA: Diagnosis not present

## 2022-03-30 DIAGNOSIS — I10 Essential (primary) hypertension: Secondary | ICD-10-CM | POA: Diagnosis not present

## 2022-03-30 DIAGNOSIS — K831 Obstruction of bile duct: Secondary | ICD-10-CM | POA: Diagnosis not present

## 2022-04-03 DIAGNOSIS — C259 Malignant neoplasm of pancreas, unspecified: Secondary | ICD-10-CM | POA: Diagnosis not present

## 2022-04-03 DIAGNOSIS — K219 Gastro-esophageal reflux disease without esophagitis: Secondary | ICD-10-CM | POA: Diagnosis not present

## 2022-04-03 DIAGNOSIS — C787 Secondary malignant neoplasm of liver and intrahepatic bile duct: Secondary | ICD-10-CM | POA: Diagnosis not present

## 2022-04-03 DIAGNOSIS — K831 Obstruction of bile duct: Secondary | ICD-10-CM | POA: Diagnosis not present

## 2022-04-03 DIAGNOSIS — C7970 Secondary malignant neoplasm of unspecified adrenal gland: Secondary | ICD-10-CM | POA: Diagnosis not present

## 2022-04-03 DIAGNOSIS — I10 Essential (primary) hypertension: Secondary | ICD-10-CM | POA: Diagnosis not present

## 2022-04-04 DIAGNOSIS — K219 Gastro-esophageal reflux disease without esophagitis: Secondary | ICD-10-CM | POA: Diagnosis not present

## 2022-04-04 DIAGNOSIS — C7970 Secondary malignant neoplasm of unspecified adrenal gland: Secondary | ICD-10-CM | POA: Diagnosis not present

## 2022-04-04 DIAGNOSIS — K831 Obstruction of bile duct: Secondary | ICD-10-CM | POA: Diagnosis not present

## 2022-04-04 DIAGNOSIS — C259 Malignant neoplasm of pancreas, unspecified: Secondary | ICD-10-CM | POA: Diagnosis not present

## 2022-04-04 DIAGNOSIS — I10 Essential (primary) hypertension: Secondary | ICD-10-CM | POA: Diagnosis not present

## 2022-04-04 DIAGNOSIS — C787 Secondary malignant neoplasm of liver and intrahepatic bile duct: Secondary | ICD-10-CM | POA: Diagnosis not present

## 2022-04-06 DIAGNOSIS — C7970 Secondary malignant neoplasm of unspecified adrenal gland: Secondary | ICD-10-CM | POA: Diagnosis not present

## 2022-04-06 DIAGNOSIS — K831 Obstruction of bile duct: Secondary | ICD-10-CM | POA: Diagnosis not present

## 2022-04-06 DIAGNOSIS — I10 Essential (primary) hypertension: Secondary | ICD-10-CM | POA: Diagnosis not present

## 2022-04-06 DIAGNOSIS — K219 Gastro-esophageal reflux disease without esophagitis: Secondary | ICD-10-CM | POA: Diagnosis not present

## 2022-04-06 DIAGNOSIS — C787 Secondary malignant neoplasm of liver and intrahepatic bile duct: Secondary | ICD-10-CM | POA: Diagnosis not present

## 2022-04-06 DIAGNOSIS — C259 Malignant neoplasm of pancreas, unspecified: Secondary | ICD-10-CM | POA: Diagnosis not present

## 2022-04-07 DIAGNOSIS — C7970 Secondary malignant neoplasm of unspecified adrenal gland: Secondary | ICD-10-CM | POA: Diagnosis not present

## 2022-04-07 DIAGNOSIS — K831 Obstruction of bile duct: Secondary | ICD-10-CM | POA: Diagnosis not present

## 2022-04-07 DIAGNOSIS — K219 Gastro-esophageal reflux disease without esophagitis: Secondary | ICD-10-CM | POA: Diagnosis not present

## 2022-04-07 DIAGNOSIS — C259 Malignant neoplasm of pancreas, unspecified: Secondary | ICD-10-CM | POA: Diagnosis not present

## 2022-04-07 DIAGNOSIS — C787 Secondary malignant neoplasm of liver and intrahepatic bile duct: Secondary | ICD-10-CM | POA: Diagnosis not present

## 2022-04-07 DIAGNOSIS — I10 Essential (primary) hypertension: Secondary | ICD-10-CM | POA: Diagnosis not present

## 2022-04-09 DEATH — deceased
